# Patient Record
Sex: Female | Born: 1990 | Race: White | Hispanic: No | Marital: Single | Smoking: Never smoker
Health system: Southern US, Community
[De-identification: ages and names within clinical notes are randomized; demographics above are authoritative.]

## PROBLEM LIST (undated history)

## (undated) HISTORY — PX: TENNIS ELBOW RELEASE/NIRSCHEL PROCEDURE: SHX6651

---
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
## 2012-01-06 ENCOUNTER — Ambulatory Visit: Payer: 59 | Attending: Pediatrics | Admitting: Physical Therapy

## 2012-01-06 DIAGNOSIS — M6281 Muscle weakness (generalized): Secondary | ICD-10-CM | POA: Insufficient documentation

## 2012-01-06 DIAGNOSIS — R293 Abnormal posture: Secondary | ICD-10-CM | POA: Insufficient documentation

## 2012-01-06 DIAGNOSIS — IMO0001 Reserved for inherently not codable concepts without codable children: Secondary | ICD-10-CM | POA: Insufficient documentation

## 2012-01-06 DIAGNOSIS — M25676 Stiffness of unspecified foot, not elsewhere classified: Secondary | ICD-10-CM | POA: Insufficient documentation

## 2012-01-06 DIAGNOSIS — R279 Unspecified lack of coordination: Secondary | ICD-10-CM | POA: Insufficient documentation

## 2012-01-06 DIAGNOSIS — M25673 Stiffness of unspecified ankle, not elsewhere classified: Secondary | ICD-10-CM | POA: Insufficient documentation

## 2012-01-06 DIAGNOSIS — R269 Unspecified abnormalities of gait and mobility: Secondary | ICD-10-CM | POA: Insufficient documentation

## 2012-01-13 ENCOUNTER — Ambulatory Visit: Payer: 59 | Admitting: Physical Therapy

## 2012-01-20 ENCOUNTER — Ambulatory Visit: Payer: 59 | Admitting: Physical Therapy

## 2012-01-27 ENCOUNTER — Ambulatory Visit: Payer: 59

## 2012-02-03 ENCOUNTER — Ambulatory Visit: Payer: 59 | Attending: Pediatrics | Admitting: Physical Therapy

## 2012-02-03 DIAGNOSIS — M6281 Muscle weakness (generalized): Secondary | ICD-10-CM | POA: Insufficient documentation

## 2012-02-03 DIAGNOSIS — IMO0001 Reserved for inherently not codable concepts without codable children: Secondary | ICD-10-CM | POA: Insufficient documentation

## 2012-02-03 DIAGNOSIS — R293 Abnormal posture: Secondary | ICD-10-CM | POA: Insufficient documentation

## 2012-02-03 DIAGNOSIS — R279 Unspecified lack of coordination: Secondary | ICD-10-CM | POA: Insufficient documentation

## 2012-02-03 DIAGNOSIS — R269 Unspecified abnormalities of gait and mobility: Secondary | ICD-10-CM | POA: Insufficient documentation

## 2012-02-10 ENCOUNTER — Ambulatory Visit: Payer: 59 | Admitting: Physical Therapy

## 2012-02-17 ENCOUNTER — Ambulatory Visit: Payer: 59 | Admitting: Physical Therapy

## 2012-02-24 ENCOUNTER — Ambulatory Visit: Payer: 59 | Admitting: Physical Therapy

## 2012-03-02 ENCOUNTER — Ambulatory Visit: Payer: 59 | Attending: Pediatrics | Admitting: Physical Therapy

## 2012-03-02 DIAGNOSIS — R269 Unspecified abnormalities of gait and mobility: Secondary | ICD-10-CM | POA: Insufficient documentation

## 2012-03-02 DIAGNOSIS — IMO0001 Reserved for inherently not codable concepts without codable children: Secondary | ICD-10-CM | POA: Insufficient documentation

## 2012-03-02 DIAGNOSIS — M6281 Muscle weakness (generalized): Secondary | ICD-10-CM | POA: Insufficient documentation

## 2012-03-09 ENCOUNTER — Ambulatory Visit: Payer: 59 | Admitting: Physical Therapy

## 2012-03-16 ENCOUNTER — Ambulatory Visit: Payer: 59 | Admitting: Physical Therapy

## 2012-03-23 ENCOUNTER — Ambulatory Visit: Payer: 59 | Admitting: Physical Therapy

## 2012-03-30 ENCOUNTER — Ambulatory Visit: Payer: 59 | Attending: Pediatrics | Admitting: Physical Therapy

## 2012-03-30 DIAGNOSIS — R269 Unspecified abnormalities of gait and mobility: Secondary | ICD-10-CM | POA: Insufficient documentation

## 2012-03-30 DIAGNOSIS — R279 Unspecified lack of coordination: Secondary | ICD-10-CM | POA: Insufficient documentation

## 2012-03-30 DIAGNOSIS — M25669 Stiffness of unspecified knee, not elsewhere classified: Secondary | ICD-10-CM | POA: Insufficient documentation

## 2012-03-30 DIAGNOSIS — M6281 Muscle weakness (generalized): Secondary | ICD-10-CM | POA: Insufficient documentation

## 2012-03-30 DIAGNOSIS — IMO0001 Reserved for inherently not codable concepts without codable children: Secondary | ICD-10-CM | POA: Insufficient documentation

## 2012-03-30 DIAGNOSIS — F82 Specific developmental disorder of motor function: Secondary | ICD-10-CM | POA: Insufficient documentation

## 2012-03-30 DIAGNOSIS — R293 Abnormal posture: Secondary | ICD-10-CM | POA: Insufficient documentation

## 2012-04-06 ENCOUNTER — Ambulatory Visit: Payer: 59 | Admitting: Physical Therapy

## 2012-04-13 ENCOUNTER — Ambulatory Visit: Payer: 59 | Admitting: Physical Therapy

## 2012-04-20 ENCOUNTER — Ambulatory Visit: Payer: 59 | Admitting: Physical Therapy

## 2012-04-27 ENCOUNTER — Ambulatory Visit: Payer: 59 | Admitting: Physical Therapy

## 2012-05-04 ENCOUNTER — Ambulatory Visit: Payer: 59 | Attending: Pediatrics | Admitting: Physical Therapy

## 2012-05-04 DIAGNOSIS — M6281 Muscle weakness (generalized): Secondary | ICD-10-CM | POA: Insufficient documentation

## 2012-05-04 DIAGNOSIS — IMO0001 Reserved for inherently not codable concepts without codable children: Secondary | ICD-10-CM | POA: Insufficient documentation

## 2012-05-04 DIAGNOSIS — R293 Abnormal posture: Secondary | ICD-10-CM | POA: Insufficient documentation

## 2012-05-04 DIAGNOSIS — R269 Unspecified abnormalities of gait and mobility: Secondary | ICD-10-CM | POA: Insufficient documentation

## 2012-05-04 DIAGNOSIS — R279 Unspecified lack of coordination: Secondary | ICD-10-CM | POA: Insufficient documentation

## 2012-05-04 DIAGNOSIS — M2569 Stiffness of other specified joint, not elsewhere classified: Secondary | ICD-10-CM | POA: Insufficient documentation

## 2012-05-11 ENCOUNTER — Ambulatory Visit: Payer: 59 | Admitting: Physical Therapy

## 2012-05-18 ENCOUNTER — Ambulatory Visit: Payer: 59 | Admitting: Physical Therapy

## 2012-05-25 ENCOUNTER — Ambulatory Visit: Payer: 59

## 2012-06-08 ENCOUNTER — Ambulatory Visit: Payer: 59 | Attending: Pediatrics | Admitting: Physical Therapy

## 2012-06-08 DIAGNOSIS — R269 Unspecified abnormalities of gait and mobility: Secondary | ICD-10-CM | POA: Insufficient documentation

## 2012-06-08 DIAGNOSIS — IMO0001 Reserved for inherently not codable concepts without codable children: Secondary | ICD-10-CM | POA: Insufficient documentation

## 2012-06-08 DIAGNOSIS — M6281 Muscle weakness (generalized): Secondary | ICD-10-CM | POA: Insufficient documentation

## 2012-06-15 ENCOUNTER — Ambulatory Visit: Payer: 59 | Admitting: Physical Therapy

## 2012-06-22 ENCOUNTER — Ambulatory Visit: Payer: 59 | Admitting: Physical Therapy

## 2012-06-29 ENCOUNTER — Ambulatory Visit: Payer: 59 | Attending: Pediatrics | Admitting: Physical Therapy

## 2012-06-29 DIAGNOSIS — R269 Unspecified abnormalities of gait and mobility: Secondary | ICD-10-CM | POA: Insufficient documentation

## 2012-06-29 DIAGNOSIS — M6281 Muscle weakness (generalized): Secondary | ICD-10-CM | POA: Insufficient documentation

## 2012-06-29 DIAGNOSIS — IMO0001 Reserved for inherently not codable concepts without codable children: Secondary | ICD-10-CM | POA: Insufficient documentation

## 2012-07-06 ENCOUNTER — Ambulatory Visit: Payer: 59 | Admitting: Physical Therapy

## 2012-07-13 ENCOUNTER — Ambulatory Visit: Payer: 59 | Admitting: Physical Therapy

## 2012-07-20 ENCOUNTER — Ambulatory Visit: Payer: 59 | Admitting: Physical Therapy

## 2012-07-27 ENCOUNTER — Ambulatory Visit: Payer: 59 | Admitting: Physical Therapy

## 2012-08-03 ENCOUNTER — Ambulatory Visit: Payer: 59 | Admitting: Physical Therapy

## 2012-08-10 ENCOUNTER — Ambulatory Visit: Payer: 59 | Admitting: Physical Therapy

## 2012-08-17 ENCOUNTER — Ambulatory Visit: Payer: 59 | Admitting: Physical Therapy

## 2012-08-21 ENCOUNTER — Ambulatory Visit: Payer: 59 | Admitting: Physical Therapy

## 2012-08-24 ENCOUNTER — Ambulatory Visit: Payer: 59 | Admitting: Physical Therapy

## 2012-08-24 ENCOUNTER — Ambulatory Visit: Payer: 59 | Attending: Pediatrics | Admitting: Physical Therapy

## 2012-08-24 DIAGNOSIS — IMO0001 Reserved for inherently not codable concepts without codable children: Secondary | ICD-10-CM | POA: Insufficient documentation

## 2012-08-24 DIAGNOSIS — R269 Unspecified abnormalities of gait and mobility: Secondary | ICD-10-CM | POA: Insufficient documentation

## 2012-08-24 DIAGNOSIS — M6281 Muscle weakness (generalized): Secondary | ICD-10-CM | POA: Insufficient documentation

## 2012-08-31 ENCOUNTER — Ambulatory Visit: Payer: 59 | Admitting: Physical Therapy

## 2012-09-04 ENCOUNTER — Ambulatory Visit: Payer: 59 | Admitting: Physical Therapy

## 2012-09-07 ENCOUNTER — Ambulatory Visit: Payer: 59 | Attending: Pediatrics | Admitting: Physical Therapy

## 2012-09-07 ENCOUNTER — Ambulatory Visit: Payer: 59 | Admitting: Physical Therapy

## 2012-09-07 DIAGNOSIS — IMO0001 Reserved for inherently not codable concepts without codable children: Secondary | ICD-10-CM | POA: Insufficient documentation

## 2012-09-07 DIAGNOSIS — R269 Unspecified abnormalities of gait and mobility: Secondary | ICD-10-CM | POA: Insufficient documentation

## 2012-09-07 DIAGNOSIS — M6281 Muscle weakness (generalized): Secondary | ICD-10-CM | POA: Insufficient documentation

## 2012-09-14 ENCOUNTER — Ambulatory Visit: Payer: 59 | Admitting: Physical Therapy

## 2012-09-18 ENCOUNTER — Ambulatory Visit: Payer: 59 | Admitting: Physical Therapy

## 2012-09-21 ENCOUNTER — Ambulatory Visit: Payer: 59 | Admitting: Physical Therapy

## 2012-10-02 ENCOUNTER — Ambulatory Visit: Payer: 59 | Admitting: Physical Therapy

## 2012-10-09 ENCOUNTER — Encounter (HOSPITAL_BASED_OUTPATIENT_CLINIC_OR_DEPARTMENT_OTHER): Payer: 59 | Attending: Plastic Surgery

## 2012-10-09 DIAGNOSIS — G7109 Other specified muscular dystrophies: Secondary | ICD-10-CM | POA: Insufficient documentation

## 2012-10-09 DIAGNOSIS — L918 Other hypertrophic disorders of the skin: Secondary | ICD-10-CM | POA: Insufficient documentation

## 2012-10-09 DIAGNOSIS — Q059 Spina bifida, unspecified: Secondary | ICD-10-CM | POA: Insufficient documentation

## 2012-10-09 NOTE — Progress Notes (Signed)
[facility]  NAME:  ANAB, VIVAR NO.:  [id number]  MEDICAL RECORD NO.:  [id number]      DATE OF BIRTH:  [birth year]  PHYSICIAN:  Ardath Sax, M.D.           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  She only weighs 50 pounds and she is very frail, lives in a wheelchair.  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 She in 2008 had an operation for scoliosis and they have 2 rods in her back apparently.  Since the surgery she in the middle of this scar, she has about a 4 or 5 cm length, has moist running granulation tissue.  I tried to probe this and I did not notice anything was going down towards the bony processes and the hardware.  I did debride away the hypertrophic granulation tissue and touched it with silver nitrate and we put silver alginate on the wound.  This young girl has temperature 98, a pulse of 100, respirations 18, blood pressure 105/60.  She will be seen back here next week and I will re-evaluate this area.  I would like to put some of the dressings that we have perhaps even Oasis when she comes back.  So I would like to apply for an Oasis or use silver alginate for now and she will return here in a week.  DIAGNOSES: 1. Post repair of scoliosis. 2. Muscular dystrophy.     Ardath Sax, M.D.     PP/MEDQ  D:  10/09/2012  T:  10/09/2012  Job:  [id number]

## 2012-10-16 ENCOUNTER — Ambulatory Visit: Payer: 59 | Admitting: Physical Therapy

## 2012-10-19 ENCOUNTER — Ambulatory Visit: Payer: 59 | Attending: Pediatrics | Admitting: Physical Therapy

## 2012-10-19 DIAGNOSIS — R269 Unspecified abnormalities of gait and mobility: Secondary | ICD-10-CM | POA: Insufficient documentation

## 2012-10-19 DIAGNOSIS — IMO0001 Reserved for inherently not codable concepts without codable children: Secondary | ICD-10-CM | POA: Insufficient documentation

## 2012-10-19 DIAGNOSIS — M6281 Muscle weakness (generalized): Secondary | ICD-10-CM | POA: Insufficient documentation

## 2012-10-30 ENCOUNTER — Encounter (HOSPITAL_BASED_OUTPATIENT_CLINIC_OR_DEPARTMENT_OTHER): Payer: 59 | Attending: Plastic Surgery

## 2012-10-30 ENCOUNTER — Ambulatory Visit: Payer: 59 | Admitting: Physical Therapy

## 2012-10-30 DIAGNOSIS — Y838 Other surgical procedures as the cause of abnormal reaction of the patient, or of later complication, without mention of misadventure at the time of the procedure: Secondary | ICD-10-CM | POA: Insufficient documentation

## 2012-10-30 DIAGNOSIS — T8189XA Other complications of procedures, not elsewhere classified, initial encounter: Secondary | ICD-10-CM | POA: Insufficient documentation

## 2012-11-02 ENCOUNTER — Ambulatory Visit: Payer: 59 | Attending: Pediatrics | Admitting: Physical Therapy

## 2012-11-02 DIAGNOSIS — R269 Unspecified abnormalities of gait and mobility: Secondary | ICD-10-CM | POA: Insufficient documentation

## 2012-11-02 DIAGNOSIS — IMO0001 Reserved for inherently not codable concepts without codable children: Secondary | ICD-10-CM | POA: Insufficient documentation

## 2012-11-02 DIAGNOSIS — M6281 Muscle weakness (generalized): Secondary | ICD-10-CM | POA: Insufficient documentation

## 2012-11-06 ENCOUNTER — Encounter (HOSPITAL_BASED_OUTPATIENT_CLINIC_OR_DEPARTMENT_OTHER): Payer: 59

## 2012-11-13 ENCOUNTER — Ambulatory Visit: Payer: 59 | Admitting: Physical Therapy

## 2012-11-16 ENCOUNTER — Ambulatory Visit: Payer: 59 | Admitting: Physical Therapy

## 2012-11-27 ENCOUNTER — Ambulatory Visit: Payer: 59 | Admitting: Physical Therapy

## 2012-11-30 ENCOUNTER — Ambulatory Visit: Payer: 59 | Admitting: Physical Therapy

## 2012-12-04 ENCOUNTER — Ambulatory Visit: Payer: 59 | Attending: Pediatrics | Admitting: Physical Therapy

## 2012-12-04 DIAGNOSIS — R269 Unspecified abnormalities of gait and mobility: Secondary | ICD-10-CM | POA: Insufficient documentation

## 2012-12-04 DIAGNOSIS — M6281 Muscle weakness (generalized): Secondary | ICD-10-CM | POA: Insufficient documentation

## 2012-12-04 DIAGNOSIS — IMO0001 Reserved for inherently not codable concepts without codable children: Secondary | ICD-10-CM | POA: Insufficient documentation

## 2012-12-14 ENCOUNTER — Ambulatory Visit: Payer: 59 | Admitting: Physical Therapy

## 2012-12-28 ENCOUNTER — Ambulatory Visit: Payer: 59

## 2013-01-08 ENCOUNTER — Ambulatory Visit: Payer: 59 | Attending: Pediatrics | Admitting: Physical Therapy

## 2013-01-08 DIAGNOSIS — R269 Unspecified abnormalities of gait and mobility: Secondary | ICD-10-CM | POA: Insufficient documentation

## 2013-01-08 DIAGNOSIS — M6281 Muscle weakness (generalized): Secondary | ICD-10-CM | POA: Insufficient documentation

## 2013-01-08 DIAGNOSIS — IMO0001 Reserved for inherently not codable concepts without codable children: Secondary | ICD-10-CM | POA: Insufficient documentation

## 2013-01-11 ENCOUNTER — Ambulatory Visit: Payer: 59 | Admitting: Physical Therapy

## 2013-01-22 ENCOUNTER — Ambulatory Visit: Payer: 59 | Admitting: Physical Therapy

## 2013-01-25 ENCOUNTER — Ambulatory Visit: Payer: 59 | Admitting: Physical Therapy

## 2013-02-05 ENCOUNTER — Ambulatory Visit: Payer: 59 | Attending: Pediatrics | Admitting: Physical Therapy

## 2013-02-05 DIAGNOSIS — R269 Unspecified abnormalities of gait and mobility: Secondary | ICD-10-CM | POA: Insufficient documentation

## 2013-02-05 DIAGNOSIS — M6281 Muscle weakness (generalized): Secondary | ICD-10-CM | POA: Insufficient documentation

## 2013-02-05 DIAGNOSIS — IMO0001 Reserved for inherently not codable concepts without codable children: Secondary | ICD-10-CM | POA: Insufficient documentation

## 2013-02-08 ENCOUNTER — Ambulatory Visit: Payer: 59 | Admitting: Physical Therapy

## 2013-02-19 ENCOUNTER — Ambulatory Visit: Payer: 59 | Admitting: Physical Therapy

## 2013-02-22 ENCOUNTER — Ambulatory Visit: Payer: 59 | Admitting: Physical Therapy

## 2013-03-05 ENCOUNTER — Ambulatory Visit: Payer: 59 | Attending: Pediatrics | Admitting: Physical Therapy

## 2013-03-05 DIAGNOSIS — M6281 Muscle weakness (generalized): Secondary | ICD-10-CM | POA: Insufficient documentation

## 2013-03-05 DIAGNOSIS — IMO0001 Reserved for inherently not codable concepts without codable children: Secondary | ICD-10-CM | POA: Insufficient documentation

## 2013-03-05 DIAGNOSIS — R269 Unspecified abnormalities of gait and mobility: Secondary | ICD-10-CM | POA: Insufficient documentation

## 2013-03-08 ENCOUNTER — Ambulatory Visit: Payer: 59 | Admitting: Physical Therapy

## 2013-03-19 ENCOUNTER — Ambulatory Visit: Payer: 59 | Admitting: Physical Therapy

## 2013-03-22 ENCOUNTER — Ambulatory Visit: Payer: 59 | Admitting: Physical Therapy

## 2013-04-02 ENCOUNTER — Ambulatory Visit: Payer: 59 | Attending: Pediatrics | Admitting: Physical Therapy

## 2013-04-02 DIAGNOSIS — IMO0001 Reserved for inherently not codable concepts without codable children: Secondary | ICD-10-CM | POA: Insufficient documentation

## 2013-04-02 DIAGNOSIS — R269 Unspecified abnormalities of gait and mobility: Secondary | ICD-10-CM | POA: Insufficient documentation

## 2013-04-02 DIAGNOSIS — M6281 Muscle weakness (generalized): Secondary | ICD-10-CM | POA: Insufficient documentation

## 2013-04-05 ENCOUNTER — Ambulatory Visit: Payer: 59 | Admitting: Physical Therapy

## 2013-04-16 ENCOUNTER — Ambulatory Visit: Payer: 59 | Admitting: Physical Therapy

## 2013-04-19 ENCOUNTER — Ambulatory Visit: Payer: 59 | Admitting: Physical Therapy

## 2013-04-30 ENCOUNTER — Ambulatory Visit: Payer: 59 | Attending: Pediatrics | Admitting: Physical Therapy

## 2013-04-30 DIAGNOSIS — IMO0001 Reserved for inherently not codable concepts without codable children: Secondary | ICD-10-CM | POA: Insufficient documentation

## 2013-04-30 DIAGNOSIS — M6281 Muscle weakness (generalized): Secondary | ICD-10-CM | POA: Insufficient documentation

## 2013-04-30 DIAGNOSIS — R269 Unspecified abnormalities of gait and mobility: Secondary | ICD-10-CM | POA: Insufficient documentation

## 2013-05-03 ENCOUNTER — Ambulatory Visit: Payer: 59 | Admitting: Physical Therapy

## 2013-05-14 ENCOUNTER — Ambulatory Visit: Payer: 59 | Admitting: Physical Therapy

## 2013-05-17 ENCOUNTER — Ambulatory Visit: Payer: 59 | Admitting: Physical Therapy

## 2013-05-28 ENCOUNTER — Ambulatory Visit: Payer: 59 | Admitting: Physical Therapy

## 2013-05-31 ENCOUNTER — Ambulatory Visit: Payer: 59 | Admitting: Physical Therapy

## 2013-06-11 ENCOUNTER — Ambulatory Visit: Payer: 59 | Admitting: Physical Therapy

## 2013-06-14 ENCOUNTER — Ambulatory Visit: Payer: 59 | Admitting: Physical Therapy

## 2013-06-25 ENCOUNTER — Ambulatory Visit: Payer: 59 | Attending: Pediatrics | Admitting: Physical Therapy

## 2013-06-25 DIAGNOSIS — M6281 Muscle weakness (generalized): Secondary | ICD-10-CM | POA: Insufficient documentation

## 2013-06-25 DIAGNOSIS — R269 Unspecified abnormalities of gait and mobility: Secondary | ICD-10-CM | POA: Insufficient documentation

## 2013-06-25 DIAGNOSIS — IMO0001 Reserved for inherently not codable concepts without codable children: Secondary | ICD-10-CM | POA: Insufficient documentation

## 2013-06-28 ENCOUNTER — Ambulatory Visit: Payer: 59 | Admitting: Physical Therapy

## 2013-07-09 ENCOUNTER — Ambulatory Visit: Payer: 59 | Attending: Pediatrics | Admitting: Physical Therapy

## 2013-07-09 DIAGNOSIS — M6281 Muscle weakness (generalized): Secondary | ICD-10-CM | POA: Insufficient documentation

## 2013-07-09 DIAGNOSIS — R269 Unspecified abnormalities of gait and mobility: Secondary | ICD-10-CM | POA: Insufficient documentation

## 2013-07-09 DIAGNOSIS — IMO0001 Reserved for inherently not codable concepts without codable children: Secondary | ICD-10-CM | POA: Insufficient documentation

## 2013-07-12 ENCOUNTER — Ambulatory Visit: Payer: 59 | Admitting: Physical Therapy

## 2013-07-23 ENCOUNTER — Ambulatory Visit: Payer: 59 | Admitting: Physical Therapy

## 2013-07-26 ENCOUNTER — Ambulatory Visit: Payer: 59 | Admitting: Physical Therapy

## 2013-08-06 ENCOUNTER — Ambulatory Visit: Payer: 59 | Admitting: Physical Therapy

## 2013-08-09 ENCOUNTER — Ambulatory Visit: Payer: 59 | Admitting: Physical Therapy

## 2013-08-09 ENCOUNTER — Ambulatory Visit: Payer: 59 | Attending: Pediatrics | Admitting: Physical Therapy

## 2013-08-09 DIAGNOSIS — M6281 Muscle weakness (generalized): Secondary | ICD-10-CM | POA: Insufficient documentation

## 2013-08-09 DIAGNOSIS — R269 Unspecified abnormalities of gait and mobility: Secondary | ICD-10-CM | POA: Insufficient documentation

## 2013-08-09 DIAGNOSIS — IMO0001 Reserved for inherently not codable concepts without codable children: Secondary | ICD-10-CM | POA: Insufficient documentation

## 2013-08-20 ENCOUNTER — Ambulatory Visit: Payer: 59 | Admitting: Physical Therapy

## 2013-08-23 ENCOUNTER — Ambulatory Visit: Payer: 59 | Admitting: Physical Therapy

## 2013-09-06 ENCOUNTER — Ambulatory Visit: Payer: 59 | Admitting: Physical Therapy

## 2013-09-06 ENCOUNTER — Ambulatory Visit: Payer: 59 | Attending: Pediatrics | Admitting: Physical Therapy

## 2013-09-06 DIAGNOSIS — M6281 Muscle weakness (generalized): Secondary | ICD-10-CM | POA: Insufficient documentation

## 2013-09-06 DIAGNOSIS — R269 Unspecified abnormalities of gait and mobility: Secondary | ICD-10-CM | POA: Insufficient documentation

## 2013-09-06 DIAGNOSIS — IMO0001 Reserved for inherently not codable concepts without codable children: Secondary | ICD-10-CM | POA: Insufficient documentation

## 2013-09-20 ENCOUNTER — Ambulatory Visit: Payer: 59 | Admitting: Physical Therapy

## 2013-10-04 ENCOUNTER — Ambulatory Visit: Payer: 59 | Admitting: Physical Therapy

## 2013-10-04 ENCOUNTER — Ambulatory Visit: Payer: 59 | Attending: Pediatrics | Admitting: Physical Therapy

## 2013-10-04 DIAGNOSIS — IMO0001 Reserved for inherently not codable concepts without codable children: Secondary | ICD-10-CM | POA: Insufficient documentation

## 2013-10-04 DIAGNOSIS — M6281 Muscle weakness (generalized): Secondary | ICD-10-CM | POA: Insufficient documentation

## 2013-10-04 DIAGNOSIS — R269 Unspecified abnormalities of gait and mobility: Secondary | ICD-10-CM | POA: Insufficient documentation

## 2013-10-18 ENCOUNTER — Ambulatory Visit: Payer: 59 | Admitting: Physical Therapy

## 2013-11-01 ENCOUNTER — Ambulatory Visit: Payer: 59 | Attending: Pediatrics | Admitting: Physical Therapy

## 2013-11-01 ENCOUNTER — Ambulatory Visit: Payer: 59 | Admitting: Physical Therapy

## 2013-11-01 DIAGNOSIS — R269 Unspecified abnormalities of gait and mobility: Secondary | ICD-10-CM | POA: Insufficient documentation

## 2013-11-01 DIAGNOSIS — IMO0001 Reserved for inherently not codable concepts without codable children: Secondary | ICD-10-CM | POA: Insufficient documentation

## 2013-11-01 DIAGNOSIS — M6281 Muscle weakness (generalized): Secondary | ICD-10-CM | POA: Insufficient documentation

## 2013-11-15 ENCOUNTER — Ambulatory Visit: Payer: 59 | Admitting: Physical Therapy

## 2013-12-13 ENCOUNTER — Ambulatory Visit: Payer: 59 | Admitting: Physical Therapy

## 2013-12-13 ENCOUNTER — Ambulatory Visit: Payer: 59 | Attending: Pediatrics | Admitting: Physical Therapy

## 2013-12-13 DIAGNOSIS — IMO0001 Reserved for inherently not codable concepts without codable children: Secondary | ICD-10-CM | POA: Insufficient documentation

## 2013-12-13 DIAGNOSIS — M6281 Muscle weakness (generalized): Secondary | ICD-10-CM | POA: Insufficient documentation

## 2013-12-13 DIAGNOSIS — R269 Unspecified abnormalities of gait and mobility: Secondary | ICD-10-CM | POA: Insufficient documentation

## 2013-12-27 ENCOUNTER — Ambulatory Visit: Payer: 59 | Admitting: Physical Therapy

## 2013-12-27 ENCOUNTER — Encounter (INDEPENDENT_AMBULATORY_CARE_PROVIDER_SITE_OTHER): Payer: Self-pay

## 2014-01-09 ENCOUNTER — Encounter (HOSPITAL_BASED_OUTPATIENT_CLINIC_OR_DEPARTMENT_OTHER): Payer: 59 | Attending: General Surgery

## 2014-01-09 DIAGNOSIS — K219 Gastro-esophageal reflux disease without esophagitis: Secondary | ICD-10-CM | POA: Insufficient documentation

## 2014-01-09 DIAGNOSIS — G7109 Other specified muscular dystrophies: Secondary | ICD-10-CM | POA: Insufficient documentation

## 2014-01-09 DIAGNOSIS — Z79899 Other long term (current) drug therapy: Secondary | ICD-10-CM | POA: Insufficient documentation

## 2014-01-09 DIAGNOSIS — M21619 Bunion of unspecified foot: Secondary | ICD-10-CM | POA: Insufficient documentation

## 2014-01-09 DIAGNOSIS — Y838 Other surgical procedures as the cause of abnormal reaction of the patient, or of later complication, without mention of misadventure at the time of the procedure: Secondary | ICD-10-CM | POA: Insufficient documentation

## 2014-01-09 DIAGNOSIS — G4730 Sleep apnea, unspecified: Secondary | ICD-10-CM | POA: Insufficient documentation

## 2014-01-09 DIAGNOSIS — T8189XA Other complications of procedures, not elsewhere classified, initial encounter: Secondary | ICD-10-CM | POA: Insufficient documentation

## 2014-01-10 ENCOUNTER — Ambulatory Visit: Payer: 59 | Attending: Pediatrics | Admitting: Physical Therapy

## 2014-01-10 ENCOUNTER — Ambulatory Visit: Payer: 59 | Admitting: Physical Therapy

## 2014-01-10 DIAGNOSIS — IMO0001 Reserved for inherently not codable concepts without codable children: Secondary | ICD-10-CM | POA: Insufficient documentation

## 2014-01-10 DIAGNOSIS — R269 Unspecified abnormalities of gait and mobility: Secondary | ICD-10-CM | POA: Insufficient documentation

## 2014-01-10 DIAGNOSIS — M6281 Muscle weakness (generalized): Secondary | ICD-10-CM | POA: Insufficient documentation

## 2014-01-23 NOTE — H&P (Signed)
NAME:  Yvonne Salazar, Yvonne Salazar               ACCOUNT NO.:  MEDICAL RECORD NO.:  [id number]  LOCATION:                                 FACILITY:  PHYSICIAN:  Joanne Gaveloy Fredda Clarida, M.D.        DATE OF BIRTH:  1991/11/23  DATE OF ADMISSION: DATE OF DISCHARGE:                             HISTORY & PHYSICAL   CHIEF COMPLAINT:  Wound VAC.  HISTORY OF PRESENT ILLNESS:  This is a 23 year old with muscular dystrophy who has had several back operations for the same midline incision.  She now has a sizable wound of a bunion granulation tissue, which she thinks has been present on and off for 5+ years.  PAST MEDICAL HISTORY:  Significant for pneumonia, sleep apnea, scoliosis, muscular dystrophy, and GERD.  PAST SURGICAL HISTORY:  Syrinx surgery in 2008, scoliosis repair in 2008, and Achilles tendon release in 2014.  MEDICATIONS:  Nexium and Advil.  ALLERGIES:  None.  CIGARETTES:  None.  ALCOHOL:  None.  REVIEW OF SYSTEMS:  As above.  PHYSICAL EXAMINATION:  VITAL SIGNS:  Temperature 98.6, pulse 86, respirations 19, blood pressure 98/67. GENERAL APPEARANCE:  A very slender female in no distress. CHEST:  Clear. HEART:  Regular rhythm. BACK:  Examination of the back reveals in the midst of a midline incision which is healed, a 6.8 x 1.1 area of very abundant granulation tissue.  IMPRESSION:  Unhealed surgical wound of the back.  PLAN OF TREATMENT:  We started with silver nitrate.  We will dress with silver alginate, and we will see her in 7 days probably for another silver nitrate cautery.     Joanne Gaveloy Sehaj Kolden, M.D.     RA/MEDQ  D:  01/22/2014  T:  01/22/2014  Job:  [id number]

## 2014-01-24 ENCOUNTER — Ambulatory Visit: Payer: 59

## 2014-01-24 ENCOUNTER — Ambulatory Visit: Payer: 59 | Admitting: Physical Therapy

## 2014-01-29 ENCOUNTER — Encounter (HOSPITAL_BASED_OUTPATIENT_CLINIC_OR_DEPARTMENT_OTHER): Payer: 59 | Attending: General Surgery

## 2014-01-29 DIAGNOSIS — T8189XA Other complications of procedures, not elsewhere classified, initial encounter: Secondary | ICD-10-CM | POA: Insufficient documentation

## 2014-01-29 DIAGNOSIS — Y849 Medical procedure, unspecified as the cause of abnormal reaction of the patient, or of later complication, without mention of misadventure at the time of the procedure: Secondary | ICD-10-CM | POA: Insufficient documentation

## 2014-02-07 ENCOUNTER — Ambulatory Visit: Payer: 59 | Attending: Pediatrics | Admitting: Physical Therapy

## 2014-02-07 ENCOUNTER — Ambulatory Visit: Payer: 59 | Admitting: Physical Therapy

## 2014-02-07 DIAGNOSIS — M6281 Muscle weakness (generalized): Secondary | ICD-10-CM | POA: Insufficient documentation

## 2014-02-07 DIAGNOSIS — IMO0001 Reserved for inherently not codable concepts without codable children: Secondary | ICD-10-CM | POA: Insufficient documentation

## 2014-02-07 DIAGNOSIS — R269 Unspecified abnormalities of gait and mobility: Secondary | ICD-10-CM | POA: Insufficient documentation

## 2014-02-21 ENCOUNTER — Ambulatory Visit: Payer: 59 | Admitting: Physical Therapy

## 2014-03-05 ENCOUNTER — Encounter (HOSPITAL_BASED_OUTPATIENT_CLINIC_OR_DEPARTMENT_OTHER): Payer: 59 | Attending: General Surgery

## 2014-03-05 DIAGNOSIS — T8189XA Other complications of procedures, not elsewhere classified, initial encounter: Secondary | ICD-10-CM | POA: Insufficient documentation

## 2014-03-05 DIAGNOSIS — Y849 Medical procedure, unspecified as the cause of abnormal reaction of the patient, or of later complication, without mention of misadventure at the time of the procedure: Secondary | ICD-10-CM | POA: Insufficient documentation

## 2014-03-07 ENCOUNTER — Ambulatory Visit: Payer: 59 | Attending: Pediatrics | Admitting: Physical Therapy

## 2014-03-07 ENCOUNTER — Ambulatory Visit: Payer: 59 | Admitting: Physical Therapy

## 2014-03-07 DIAGNOSIS — R29898 Other symptoms and signs involving the musculoskeletal system: Secondary | ICD-10-CM | POA: Insufficient documentation

## 2014-03-07 DIAGNOSIS — IMO0001 Reserved for inherently not codable concepts without codable children: Secondary | ICD-10-CM | POA: Insufficient documentation

## 2014-03-21 ENCOUNTER — Ambulatory Visit: Payer: 59 | Admitting: Physical Therapy

## 2014-03-21 ENCOUNTER — Ambulatory Visit: Payer: 59 | Attending: Pediatrics | Admitting: Physical Therapy

## 2014-03-21 DIAGNOSIS — IMO0001 Reserved for inherently not codable concepts without codable children: Secondary | ICD-10-CM | POA: Insufficient documentation

## 2014-03-21 DIAGNOSIS — R269 Unspecified abnormalities of gait and mobility: Secondary | ICD-10-CM | POA: Insufficient documentation

## 2014-03-21 DIAGNOSIS — M6281 Muscle weakness (generalized): Secondary | ICD-10-CM | POA: Insufficient documentation

## 2014-04-02 ENCOUNTER — Encounter (HOSPITAL_BASED_OUTPATIENT_CLINIC_OR_DEPARTMENT_OTHER): Payer: 59 | Attending: General Surgery

## 2014-04-02 DIAGNOSIS — T8189XA Other complications of procedures, not elsewhere classified, initial encounter: Secondary | ICD-10-CM | POA: Insufficient documentation

## 2014-04-02 DIAGNOSIS — G7109 Other specified muscular dystrophies: Secondary | ICD-10-CM | POA: Insufficient documentation

## 2014-04-02 DIAGNOSIS — Y831 Surgical operation with implant of artificial internal device as the cause of abnormal reaction of the patient, or of later complication, without mention of misadventure at the time of the procedure: Secondary | ICD-10-CM | POA: Insufficient documentation

## 2014-04-02 DIAGNOSIS — M4120 Other idiopathic scoliosis, site unspecified: Secondary | ICD-10-CM | POA: Insufficient documentation

## 2014-04-04 ENCOUNTER — Ambulatory Visit: Payer: 59 | Attending: Pediatrics | Admitting: Physical Therapy

## 2014-04-04 ENCOUNTER — Ambulatory Visit: Payer: 59 | Admitting: Physical Therapy

## 2014-04-04 DIAGNOSIS — R269 Unspecified abnormalities of gait and mobility: Secondary | ICD-10-CM | POA: Insufficient documentation

## 2014-04-04 DIAGNOSIS — IMO0001 Reserved for inherently not codable concepts without codable children: Secondary | ICD-10-CM | POA: Insufficient documentation

## 2014-04-04 DIAGNOSIS — M6281 Muscle weakness (generalized): Secondary | ICD-10-CM | POA: Insufficient documentation

## 2014-04-17 NOTE — Progress Notes (Signed)
[facility]  NAME:  Yvonne Salazar, Yvonne Salazar               ACCOUNT NO.:  MEDICAL RECORD NO.:  [id number]      DATE OF BIRTH:  [birth year]  PHYSICIAN:  Glenna FellowsBrinda Rocio Roam, MD    VISIT DATE:  04/15/2014                                  OFFICE VISIT   CHIEF COMPLAINT:  Back ulceration.  HISTORY OF PRESENT ILLNESS:  The patient is a 23 year old female with history of severe scoliosis and muscular dystrophy that presents with a chronic back wound.  The patient has been under the care of Dr. Wiliam KeArkin and is referred to this clinic for evaluation.  Her current wound care has been silver alginate.  She has previously had Oasis application as well as collagen treatment.  The patient initially presented with a wound that was measured 6.8 x 1.1 cm.  Her current wound is significantly contracted, and she also notes significant contraction since her last visit here.  Her wound care is completed by her mother.  She has had no laboratories in the last 6 months' time at least.  She is not taking any protein supplementation.  The mother reports that the wound that occurred in her previous scar line during the woodworking classes she feels she may have been too active for a fresh scar.  She has never had any exposure for hardware with this wound.  She has never required any previous flap surgery or grafting.  She does have hardware in her spine from her previous back surgeries.  EXAMINATION:  Blood pressure is 106/69, pulse is 96, temperature is 98.3.  The back wound is measured as 1.3 x 0.3 x 0.1 cm.  The wound is completely granulated.  There is no slough present.  This is over 50% contraction since her last visit here.  ASSESSMENT AND PLAN:  Contracting back wound.  Given the current size and the lack of drainage, I counseled the family to return to collagen and change 2-3 times weekly.  She has demonstrated significant change over the last 2 weeks despite the chronic nature of her  wounds.  We will plan to reassess in 3 weeks time.  We will order laboratory including prealbumin, and we instructed on increasing her protein intake, including Carnation Instant breakfast or a whey protein powder for supplementation.          ______________________________ Glenna FellowsBrinda Eissa Buchberger, MD     BT/MEDQ  D:  04/15/2014  T:  04/16/2014  Job:  [id number]

## 2014-04-18 ENCOUNTER — Ambulatory Visit: Payer: 59 | Admitting: Physical Therapy

## 2014-05-02 ENCOUNTER — Ambulatory Visit: Payer: 59 | Attending: Pediatrics | Admitting: Physical Therapy

## 2014-05-02 ENCOUNTER — Ambulatory Visit: Payer: 59 | Admitting: Physical Therapy

## 2014-05-02 DIAGNOSIS — R269 Unspecified abnormalities of gait and mobility: Secondary | ICD-10-CM | POA: Insufficient documentation

## 2014-05-02 DIAGNOSIS — IMO0001 Reserved for inherently not codable concepts without codable children: Secondary | ICD-10-CM | POA: Insufficient documentation

## 2014-05-02 DIAGNOSIS — M6281 Muscle weakness (generalized): Secondary | ICD-10-CM | POA: Insufficient documentation

## 2014-05-06 ENCOUNTER — Encounter (HOSPITAL_BASED_OUTPATIENT_CLINIC_OR_DEPARTMENT_OTHER): Payer: 59 | Attending: General Surgery

## 2014-05-06 DIAGNOSIS — T8189XA Other complications of procedures, not elsewhere classified, initial encounter: Secondary | ICD-10-CM | POA: Insufficient documentation

## 2014-05-06 DIAGNOSIS — M4120 Other idiopathic scoliosis, site unspecified: Secondary | ICD-10-CM | POA: Insufficient documentation

## 2014-05-06 DIAGNOSIS — G7109 Other specified muscular dystrophies: Secondary | ICD-10-CM | POA: Insufficient documentation

## 2014-05-06 DIAGNOSIS — Y839 Surgical procedure, unspecified as the cause of abnormal reaction of the patient, or of later complication, without mention of misadventure at the time of the procedure: Secondary | ICD-10-CM | POA: Insufficient documentation

## 2014-05-07 NOTE — Progress Notes (Signed)
[facility]  NAME:  Yvonne Salazar, Yvonne Salazar NO.:  MEDICAL RECORD NO.:  [id number]      DATE OF BIRTH:  07-12-1991  PHYSICIAN:  Glenna Fellows, MD    VISIT DATE:  05/06/2014                                  OFFICE VISIT   [name] is a 23 year old female with history of severe scoliosis and muscular dystrophy that presents for followup of a chronic back wound. Over her last visit, she has been doing collagen and alternating with alginate that completed by her mother.  She has had previous Oasis application, last was at least 2 months ago. They note scant drainage from the wound.  PHYSICAL EXAMINATION:  VITAL SIGNS:  Blood pressure is 95/66, pulse is 102, temperature is 98.5.  Back wound is measured 0.7 x 0.3 x 0.1 cm.  No debridement was performed.  The wound is completely granulated.  ASSESSMENT:  Although this does represent a contraction of her wound, it is very slow in nature and scar and that new epithelialization does not appear to be stable.  We will plan for application of Oasis today. Following 1 week, mother will resume collagen treatment.  I have counseled against the use of alginate as I feel the wound is quite dry to begin with and I am concerned about removing any of the new skin with the dried alginate.  After irrigation of the wound, 10.5 cm2 of Oasis substrate was then applied to the wound.  It was moistened with normal saline and covered with gauze dressing.  We will plan for followup in 3 week's time.          ______________________________ Glenna Fellows, MD     BT/MEDQ  D:  05/06/2014  T:  05/07/2014  Job:  [id number]

## 2014-05-16 ENCOUNTER — Ambulatory Visit: Payer: 59 | Admitting: Physical Therapy

## 2014-05-30 ENCOUNTER — Ambulatory Visit: Payer: 59 | Admitting: Physical Therapy

## 2014-05-30 ENCOUNTER — Ambulatory Visit: Payer: 59 | Attending: Pediatrics

## 2014-05-30 DIAGNOSIS — M6281 Muscle weakness (generalized): Secondary | ICD-10-CM | POA: Insufficient documentation

## 2014-05-30 DIAGNOSIS — IMO0001 Reserved for inherently not codable concepts without codable children: Secondary | ICD-10-CM | POA: Diagnosis not present

## 2014-05-30 DIAGNOSIS — R269 Unspecified abnormalities of gait and mobility: Secondary | ICD-10-CM | POA: Insufficient documentation

## 2014-06-13 ENCOUNTER — Ambulatory Visit: Payer: 59 | Admitting: Physical Therapy

## 2014-06-13 ENCOUNTER — Ambulatory Visit: Payer: 59

## 2014-06-13 DIAGNOSIS — IMO0001 Reserved for inherently not codable concepts without codable children: Secondary | ICD-10-CM | POA: Diagnosis not present

## 2014-06-27 ENCOUNTER — Ambulatory Visit: Payer: 59 | Admitting: Physical Therapy

## 2014-06-27 DIAGNOSIS — IMO0001 Reserved for inherently not codable concepts without codable children: Secondary | ICD-10-CM | POA: Diagnosis not present

## 2014-07-11 ENCOUNTER — Ambulatory Visit: Payer: 59 | Attending: Pediatrics | Admitting: Physical Therapy

## 2014-07-11 ENCOUNTER — Ambulatory Visit: Payer: 59 | Admitting: Physical Therapy

## 2014-07-11 DIAGNOSIS — IMO0001 Reserved for inherently not codable concepts without codable children: Secondary | ICD-10-CM | POA: Diagnosis not present

## 2014-07-11 DIAGNOSIS — R269 Unspecified abnormalities of gait and mobility: Secondary | ICD-10-CM | POA: Diagnosis not present

## 2014-07-11 DIAGNOSIS — M6281 Muscle weakness (generalized): Secondary | ICD-10-CM | POA: Diagnosis not present

## 2014-07-25 ENCOUNTER — Ambulatory Visit: Payer: 59 | Admitting: Physical Therapy

## 2014-07-25 DIAGNOSIS — IMO0001 Reserved for inherently not codable concepts without codable children: Secondary | ICD-10-CM | POA: Diagnosis not present

## 2014-08-08 ENCOUNTER — Ambulatory Visit: Payer: 59 | Attending: Pediatrics | Admitting: Physical Therapy

## 2014-08-08 ENCOUNTER — Ambulatory Visit: Payer: 59 | Admitting: Physical Therapy

## 2014-08-08 DIAGNOSIS — M6281 Muscle weakness (generalized): Secondary | ICD-10-CM | POA: Insufficient documentation

## 2014-08-08 DIAGNOSIS — IMO0001 Reserved for inherently not codable concepts without codable children: Secondary | ICD-10-CM | POA: Insufficient documentation

## 2014-08-08 DIAGNOSIS — R269 Unspecified abnormalities of gait and mobility: Secondary | ICD-10-CM | POA: Diagnosis not present

## 2014-08-22 ENCOUNTER — Ambulatory Visit: Payer: 59 | Admitting: Physical Therapy

## 2014-08-22 DIAGNOSIS — IMO0001 Reserved for inherently not codable concepts without codable children: Secondary | ICD-10-CM | POA: Diagnosis not present

## 2014-09-05 ENCOUNTER — Ambulatory Visit: Payer: 59 | Attending: Pediatrics | Admitting: Physical Therapy

## 2014-09-05 ENCOUNTER — Ambulatory Visit: Payer: 59 | Admitting: Physical Therapy

## 2014-09-05 DIAGNOSIS — M6281 Muscle weakness (generalized): Secondary | ICD-10-CM | POA: Insufficient documentation

## 2014-09-05 DIAGNOSIS — R293 Abnormal posture: Secondary | ICD-10-CM | POA: Insufficient documentation

## 2014-09-05 DIAGNOSIS — R269 Unspecified abnormalities of gait and mobility: Secondary | ICD-10-CM | POA: Diagnosis not present

## 2014-09-19 ENCOUNTER — Ambulatory Visit: Payer: 59 | Admitting: Physical Therapy

## 2014-09-19 DIAGNOSIS — M6281 Muscle weakness (generalized): Secondary | ICD-10-CM | POA: Diagnosis not present

## 2014-10-03 ENCOUNTER — Encounter: Payer: Self-pay | Admitting: Physical Therapy

## 2014-10-03 ENCOUNTER — Ambulatory Visit: Payer: 59 | Admitting: Physical Therapy

## 2014-10-03 ENCOUNTER — Ambulatory Visit: Payer: 59 | Attending: Family Medicine | Admitting: Physical Therapy

## 2014-10-03 DIAGNOSIS — Z7409 Other reduced mobility: Secondary | ICD-10-CM | POA: Diagnosis not present

## 2014-10-03 DIAGNOSIS — M2450 Contracture, unspecified joint: Secondary | ICD-10-CM

## 2014-10-03 DIAGNOSIS — R293 Abnormal posture: Secondary | ICD-10-CM | POA: Diagnosis not present

## 2014-10-03 DIAGNOSIS — R531 Weakness: Secondary | ICD-10-CM

## 2014-10-03 DIAGNOSIS — R52 Pain, unspecified: Secondary | ICD-10-CM | POA: Insufficient documentation

## 2014-10-03 NOTE — Patient Instructions (Signed)
Yvonne Salazar was encouraged to move out of resting posture as much as possible.

## 2014-10-03 NOTE — Therapy (Signed)
Pediatric Physical Therapy Treatment  Patient Details  Name: Yvonne Salazar MRN: [id number] Date of Birth: 1991-09-19  Encounter date: 10/03/2014      End of Session - 10/03/14 1234    Visit Number [id number]   Date for PT Re-Evaluation 02/20/15   Authorization Type [employer]   Authorization Time Period next recertification is due on 02/20/15   Authorization - Visit Number 10   Authorization - Number of Visits 60   PT Start Time 0820   PT Stop Time 0900   PT Time Calculation (min) 40 min   Activity Tolerance Patient tolerated treatment well      History reviewed. No pertinent past medical history.  History reviewed. No pertinent past surgical history.  There were no vitals taken for this visit.  Visit Diagnosis:Flexion contractures  Decreased strength, endurance, and mobility  Pain  Posture abnormality  Limited mobility           Pediatric PT Treatment - 10/03/14 1229    Subjective Information   Patient Comments Yvonne Salazar feels a little stiff today; has been very busy with college classes.   PT Pediatric Exercise/Activities   Exercise/Activities Strengthening Activities;Core Stability Activities;ROM   Strengthening Activities --  active lateral flexion to the left in w/c and EOM   Activities Performed   Comment Yvonne Salazar worked [employer] with feet dangling   Core Stability Details Yvonne Salazar leaneg against PT's hand support.   ROM   Comment Yvonne Salazar's neck was stretched out of right lateral flexion and upper trapezius muscles were massaged.  Yvonne Salazar was also passively stretched out of elbow flexion, knee flexion and hip adduction and ankle plantarfleixon.           Patient Education - 10/03/14 1234    Education Provided Yes   Education Description Discussed Yvonne Salazar's resting posture and need to move out of it.   Person(s) Educated Patient;Mother   Method Education Verbal explanation;Demonstration;Observed session   Comprehension Returned demonstration          Peds PT Short Term  Goals - 10/03/14 1239    PEDS PT  SHORT TERM GOAL #1   Title Yvonne Salazar will be able to hold head in less than 30 degree of right lateral flexion after neck stretches for at least 10 seconds.   Baseline Yvonne Salazar holds head in 45 degrees of right lateral flexion.   Time 6   Period Months   Status On-going   PEDS PT  SHORT TERM GOAL #2   Title Yvonne Salazar will have knees extended to flexion point of 45 degrees to decrease knee contractions.   Baseline Yvonne Salazar holds knees in 90 degrees, but they can be extended to 60 degrees of flexion.   Time 6   Period Months   Status On-going   PEDS PT  SHORT TERM GOAL #3   Title Yvonne Salazar will sit at the edge of mat for five minutes with no support (no learning on own extremities).   Baseline Yvonne Salazar leans after about 30 seconds.   Time 6   Period Months   Status On-going   PEDS PT  SHORT TERM GOAL #4   Title Yvonne Salazar will perform forced exhalation work (staccato breaths) X 10 consecutive exercises before taking a break to improve respiratory control.    Baseline Yvonne Salazar can do 5 forced consecutive exhalations.   Time 6   Period Months   Status On-going            Plan - 10/03/14 1236    Clinical Impression Statement  Patient fatigues without support of wheelchair/without back or extremity support.  Her right SCM is very tight and full body asymmetry is increasing.     Patient will benefit from treatment of the following deficits: Decreased ability to maintain good postural alignment;Decreased ability to participate in recreational activities;Decreased function at home and in the community   Rehab Potential Excellent   Clinical impairments affecting rehab potential N/A   PT Frequency 1X/week   PT Duration 6 months   PT Treatment/Intervention Therapeutic activities;Therapeutic exercises;Patient/family education;Neuromuscular reeducation;Manual techniques;Instruction proper posture/body mechanics;Self-care and home management;Other (comment)  massage   PT plan Patient will be  seen weekly to improve postural alignment and comfort.       Problem List There are no active problems to display for this patient.                   Lathen Seal 10/03/2014, 12:48 PM

## 2014-10-17 ENCOUNTER — Ambulatory Visit: Payer: 59 | Admitting: Physical Therapy

## 2014-10-17 ENCOUNTER — Encounter: Payer: Self-pay | Admitting: Physical Therapy

## 2014-10-17 DIAGNOSIS — Z7409 Other reduced mobility: Secondary | ICD-10-CM

## 2014-10-17 DIAGNOSIS — R52 Pain, unspecified: Secondary | ICD-10-CM

## 2014-10-17 DIAGNOSIS — M2450 Contracture, unspecified joint: Secondary | ICD-10-CM

## 2014-10-17 DIAGNOSIS — R293 Abnormal posture: Secondary | ICD-10-CM

## 2014-10-17 DIAGNOSIS — R531 Weakness: Secondary | ICD-10-CM

## 2014-10-17 DIAGNOSIS — R6889 Other general symptoms and signs: Secondary | ICD-10-CM

## 2014-10-17 NOTE — Therapy (Signed)
Pediatric Physical Therapy Treatment  Patient Details  Name: Yvonne Salazar MRN: [id number] Date of Birth: [birth year]  Encounter date: 10/17/2014      End of Session - 10/17/14 1142    Visit Number 329   Authorization Type [employer]   Authorization Time Period next recertification is due on 02/20/15   Authorization - Visit Number 11   Authorization - Number of Visits 60   PT Start Time 0830   PT Stop Time 0905   PT Time Calculation (min) 35 min   Activity Tolerance Patient tolerated treatment well   Behavior During Therapy Willing to participate   Activity Tolerance Patient tolerated treatment well      History reviewed. No pertinent past medical history.  History reviewed. No pertinent past surgical history.  There were no vitals taken for this visit.  Visit Diagnosis:Flexion contractures  Decreased strength, endurance, and mobility  Pain  Posture abnormality  Limited mobility           Pediatric PT Treatment - 10/17/14 0913    Subjective Information   Patient Comments Yvonne Salazar reports having a rough night, with shooting pain into her left foot (on the dorsum of her foot).     PT Pediatric Exercise/Activities   Strengthening Activities Yvonne Salazar sat in her wheelchair at start and end of session, and her knees and ankles and elbows were moved to end-ranges.    Activities Performed   Core Stability Details Yvonne Salazar moved to edge of mat with foot support.  Yvonne Salazar sat in this position for about 15 minutes.  She was transferred two times on the mat via a lift transfer/total assist, and Yvonne Salazar was able to provide feedback regarding positioning during transfer.  Yvonne Salazar moved out of her base of support with bilateral hand support, leaning forward and back and laterally for about 3 minutes.     ROM   Ankle DF Yvonne Salazar did perform ankle pumps, about 5 reps, 2 trials, on both sides when knees were extended out of 90 degree resting posture.     Comment Yvonne Salazar was instructed to keep both feet in  her foot rests because wind sweeping toward the right causes her to hold her left foot right of midline.              Patient Education - 10/17/14 1139    Education Provided Yes   Education Description Reiterated to Yvonne Salazar that PT will recommend more stabile and restrictive foot plates if she is unable to keep her feet in a more symmetric resting posture.   Person(s) Educated Mother;Patient   Method Education Verbal explanation   Comprehension Verbalized understanding              Plan - 10/17/14 1144    Clinical Impression Statement Yvonne Salazar does prop her neck with her right arm when sitting without support.  Yvonne Salazar reports this is secondary to neck muscular fatigue; however, when her right arm is unsupported, she has increased strain on her neck posture.  Yvonne Salazar is complaining of increased parasthesia intermittently effecting her sleep pattern with radiating pain to her left foot (dorsum).   [name] posture is increasingly asymmetric in her whleeelchair at her lower extremities secondary to hip tightness bilaterally.     Patient will benefit from treatment of the following deficits: Decreased ability to maintain good postural alignment;Decreased ability to participate in recreational activities;Decreased function at home and in the community   Rehab Potential Excellent   Clinical impairments affecting rehab potential N/A   PT  Frequency 1X/week   PT Duration 6 months   PT Treatment/Intervention Therapeutic activities;Therapeutic exercises;Neuromuscular reeducation;Patient/family education;Wheelchair management;Manual techniques;Instruction proper posture/body mechanics;Self-care and home management   PT plan Continue weekly PT to promote increased ROM and strengthening of core and respiratory muscles.       Problem List There are no active problems to display for this patient.                   Shaela Boer PT 10/17/2014, 11:49 AM

## 2014-10-31 ENCOUNTER — Ambulatory Visit: Payer: 59 | Attending: Pediatrics | Admitting: Physical Therapy

## 2014-10-31 ENCOUNTER — Encounter: Payer: Self-pay | Admitting: Physical Therapy

## 2014-10-31 ENCOUNTER — Ambulatory Visit: Payer: 59 | Admitting: Physical Therapy

## 2014-10-31 DIAGNOSIS — Z7409 Other reduced mobility: Secondary | ICD-10-CM

## 2014-10-31 DIAGNOSIS — R6889 Other general symptoms and signs: Secondary | ICD-10-CM

## 2014-10-31 DIAGNOSIS — R293 Abnormal posture: Secondary | ICD-10-CM | POA: Insufficient documentation

## 2014-10-31 DIAGNOSIS — R531 Weakness: Secondary | ICD-10-CM

## 2014-10-31 DIAGNOSIS — R52 Pain, unspecified: Secondary | ICD-10-CM | POA: Insufficient documentation

## 2014-10-31 DIAGNOSIS — M2450 Contracture, unspecified joint: Secondary | ICD-10-CM | POA: Diagnosis not present

## 2014-10-31 NOTE — Therapy (Signed)
[facility] [address] [place], [place], [address] Phone: [phone]   Fax:  [phone]  Pediatric Physical Therapy Treatment  Patient Details  Name: Yvonne Salazar MRN: [id number] Date of Birth: [birth year]  Encounter date: 10/31/2014      End of Session - 10/31/14 1231    Visit Number 330   Authorization Type UHC   Authorization Time Period next recertification is due on 02/20/15   Authorization - Visit Number 12   Authorization - Number of Visits 60   PT Start Time 0820   PT Stop Time 0900   PT Time Calculation (min) 40 min   Activity Tolerance Patient tolerated treatment well   Behavior During Therapy Willing to participate   Activity Tolerance Patient tolerated treatment well      History reviewed. No pertinent past medical history.  History reviewed. No pertinent past surgical history.  There were no vitals taken for this visit.  Visit Diagnosis:Flexion contractures  Decreased strength, endurance, and mobility  Pain  Limited mobility  Posture abnormality           Pediatric PT Treatment - 10/31/14 0947    Subjective Information   Patient Comments Yvonne Salazar reports not having the shooting pains in her leg right now.  She is looking forward to a visit from her sister this coming weekend.     Gross Motor Activities   Comment Yvonne Salazar transferred from w/c to mat with PT, and trasnferred back to w/c with SPT.  Yvonne Salazar was able to provide feedback to SPT to tell her why she felt unstable (wants legs/thighs mores supported).  Yvonne Salazar sat edge of mat for five minutes with feet on low bench and swaying initially imposed by PT, but then self-initiated.  She did not need to lean her head during this time.  She did sit with posterior support, including for neck, another five minutes.     Therapeutic Activities   Therapeutic Activity Details After Yvonne Salazar moved back to wheelchair, Yvonne Salazar held hands at midline and rotated from  side to side for 2 minutes without putting arms on armrests.  Also, Yvonne Salazar assisted UE's from lap to shoulder height X 10.     ROM   Knee Extension(hamstrings) Passively stretched knees into extension bilateraly while sitting in chair (each LE about one minute).             Patient Education - 10/31/14 1230    Education Provided Yes   Education Description Asked Yvonne Salazar to work on cardio/UE movement every day.  Mom plans to work on Yvonne representativetossing beach ball again.   Person(s) Educated Mother;Patient   Method Education Verbal explanation;Observed session   Comprehension Returned demonstration              Plan - 10/31/14 1231    Clinical Impression Statement Yvonne Salazar is able to work on cardio vascular work in her wheelchair by utilizing UE movement (active assisted) for timed trials.  Today, she showed more endurance for sitting without support (five minutes) and sustained UE cardio in wheelchair (2 minutes) without rest than she has demonstrated during therapy in several months.   Patient will benefit from treatment of the following deficits: Decreased ability to maintain good postural alignment;Decreased ability to participate in recreational activities;Decreased function at home and in the community   PT plan Continue PT every other week to increase Yvonne Salazar's endurance, strength and postural control.  Problem List There are no active problems to display for this patient.   SAWULSKI,CARRIE 10/31/2014, 12:44 PM  Everardo Bealsarrie Sawulski, PT 10/31/2014 12:44 PM Phone: [phone] Fax: [phone]

## 2014-11-14 ENCOUNTER — Ambulatory Visit: Payer: 59 | Admitting: Physical Therapy

## 2014-11-14 ENCOUNTER — Encounter: Payer: Self-pay | Admitting: Physical Therapy

## 2014-11-14 DIAGNOSIS — M2450 Contracture, unspecified joint: Secondary | ICD-10-CM | POA: Diagnosis not present

## 2014-11-14 DIAGNOSIS — R293 Abnormal posture: Secondary | ICD-10-CM

## 2014-11-14 DIAGNOSIS — Z7409 Other reduced mobility: Secondary | ICD-10-CM

## 2014-11-14 DIAGNOSIS — R6889 Other general symptoms and signs: Secondary | ICD-10-CM

## 2014-11-14 DIAGNOSIS — R531 Weakness: Secondary | ICD-10-CM

## 2014-11-14 DIAGNOSIS — R52 Pain, unspecified: Secondary | ICD-10-CM

## 2014-11-14 NOTE — Therapy (Signed)
[facility] [address] [place], [place], [address] Phone: [phone]   Fax:  [phone]  Pediatric Physical Therapy Treatment  Patient Details  Name: Yvonne Salazar MRN: [id number] Date of Birth: [birth year]  Encounter date: 11/14/2014      End of Session - 11/14/14 0923    Visit Number 331   Authorization Type UHC   Authorization Time Period next recertification is due on 02/20/15   Authorization - Visit Number 13   Authorization - Number of Visits 60   PT Start Time 0830   PT Stop Time 0900   PT Time Calculation (min) 30 min   Activity Tolerance Patient tolerated treatment well   Behavior During Therapy Willing to participate   Activity Tolerance Patient tolerated treatment well      History reviewed. No pertinent past medical history.  History reviewed. No pertinent past surgical history.  There were no vitals taken for this visit.  Visit Diagnosis:Flexion contractures  Decreased strength, endurance, and mobility  Pain  Limited mobility  Posture abnormality           Pediatric PT Treatment - 11/14/14 0815    Subjective Information   Patient Comments Yvonne Salazar reports have a single short episode of shooting pain above her right clavicle.  Yvonne Salazar will be traveling with her family next week for the holidays.   ROM   Knee Extension(hamstrings) Passively stretched bilateral hamstrings in her chair for 30-60 seconds at a time, 3-4 times each side.   Comment Yvonne Salazar's neck was passively stretched out of right lateral flexion and shoulder elevation, while reclined in chair.  Right upper trapezius, levator scapulae, and SCM muscles were stretched and massaged.   Pain   Pain Assessment No/denies pain           Patient Education - 11/14/14 0921    Education Provided Yes   Education Description Asked Yvonne Salazar to work on moving out of right lateral flexion, reclining, and keeping her feet on thier respective foot  rests during her travels.   Person(s) Educated Patient;Mother   Method Education Verbal explanation;Observed session   Comprehension Verbalized understanding              Plan - 11/14/14 0926    PT plan Yvonne Salazar's next appointment was cancelled due to the holiday season.  PT will resume every other week sessions to increase Yvonne Salazar's endurance, strength, and postural control.                      Problem List There are no active problems to display for this patient.   Lina SayreMcNamara, Lucill Mauck 11/14/2014, 9:31 AM

## 2014-11-28 ENCOUNTER — Ambulatory Visit: Payer: 59 | Admitting: Physical Therapy

## 2014-12-12 ENCOUNTER — Ambulatory Visit: Payer: 59 | Attending: Pediatrics | Admitting: Physical Therapy

## 2014-12-12 ENCOUNTER — Encounter: Payer: Self-pay | Admitting: Physical Therapy

## 2014-12-12 DIAGNOSIS — R52 Pain, unspecified: Secondary | ICD-10-CM

## 2014-12-12 DIAGNOSIS — R6889 Other general symptoms and signs: Secondary | ICD-10-CM

## 2014-12-12 DIAGNOSIS — R293 Abnormal posture: Secondary | ICD-10-CM | POA: Diagnosis not present

## 2014-12-12 DIAGNOSIS — Z7409 Other reduced mobility: Secondary | ICD-10-CM | POA: Insufficient documentation

## 2014-12-12 DIAGNOSIS — M2450 Contracture, unspecified joint: Secondary | ICD-10-CM | POA: Diagnosis not present

## 2014-12-12 DIAGNOSIS — R531 Weakness: Secondary | ICD-10-CM

## 2014-12-12 NOTE — Therapy (Signed)
[facility] [address] [place], [place], [address] Phone: [phone]   Fax:  [phone]  Pediatric Physical Therapy Treatment  Patient Details  Name: Yvonne Salazar MRN: [id number] Date of Birth: 09-Nov-1991 Referring Provider:  Emeterio Reeve, MD  Encounter date: 12/12/2014      End of Session - 12/12/14 0906    Visit Number 332   Authorization Type UHC   Authorization Time Period next recertification is due on 02/20/15   Authorization - Visit Number 14   Authorization - Number of Visits 60   Activity Tolerance Patient tolerated treatment well   Behavior During Therapy Willing to participate   Activity Tolerance Patient tolerated treatment well      History reviewed. No pertinent past medical history.  History reviewed. No pertinent past surgical history.  There were no vitals taken for this visit.  Visit Diagnosis:Flexion contractures  Decreased strength, endurance, and mobility  Pain  Limited mobility  Posture abnormality                  Pediatric PT Treatment - 12/12/14 0902    Subjective Information   Patient Comments Yvonne Salazar had a very long night because her dog did not sleep well after surgery, so very tired and this is why she was also running late.  Opted to work in her wheelchair only.   PT Pediatric Exercise/Activities   Strengthening Activities Breathing exercises were the focus of [name] program today.  She performed four trials of rapid pursed lip inhale/exhales, X 15 first 3 trials and X 10 last trial.  Yvonne Salazar also worked on forced exhalations, with PT emphasizing increased breath or "whoosh" sound.  She then performed large whoosh exhalation, slow four count inhalation with breat holding at top of inhale, X 4 trials.   Pain   Pain Assessment No/denies pain                 Patient Education - 12/12/14 0905    Education Provided Yes   Education Description  Forced exhalations, work up to 10 consecutive if able, to be performed daily.   Person(s) Educated Patient;Mother   Method Education Verbal explanation;Observed session   Comprehension Returned demonstration          Peds PT Short Term Goals - 10/03/14 1239    PEDS PT  SHORT TERM GOAL #1   Title Yvonne Salazar will be able to hold head in less than 30 degree of right lateral flexion after neck stretches for at least 10 seconds.   Baseline Yvonne Salazar holds head in 45 degrees of right lateral flexion.   Time 6   Period Months   Status On-going   PEDS PT  SHORT TERM GOAL #2   Title Yvonne Salazar will have knees extended to flexion point of 45 degrees to decrease knee contractions.   Baseline Yvonne Salazar holds knees in 90 degrees, but they can be extended to 60 degrees of flexion.   Time 6   Period Months   Status On-going   PEDS PT  SHORT TERM GOAL #3   Title Yvonne Salazar will sit at the edge of mat for five minutes with no support (no learning on own extremities).   Baseline Yvonne Salazar leans after about 30 seconds.   Time 6   Period Months   Status On-going   PEDS PT  SHORT TERM GOAL #4   Title Yvonne Salazar will perform forced exhalation work (staccato breaths) X 10 consecutive exercises before taking a break to  improve respiratory control.    Baseline Yvonne [name] can do 5 forced consecutive exhalations.   Time 6   Period Months   Status On-going          Peds PT Long Term Goals - 10/03/14 1241    PEDS PT  LONG TERM GOAL #1   Title Yvonne [name] will direct a caregiver (other than family) to provide daily care for her so that she could function in the community without a family member over a multiple day period.   Baseline Yvonne [name] is not independent with directing her care, relies on parent or PT.   Time 12   Period Months   Status On-going   PEDS PT  LONG TERM GOAL #2   Title Yvonne [name] will make fully informed decisions about medical equipment and rehab equipment that can help her maximize her function.    Baseline [name] equipment needs change  as she is losing funciton.   Time 12   Period Months   Status On-going          Plan - 12/12/14 0906    Clinical Impression Statement Yvonne [name] has increased ability to work on respiratory exercises, especially in the comfort and support of her wheelchair.  She struggles with prolonged exhalation.   PT plan Yvonne [name] to continue every other week PT to promote HEP with focus on posture and respiratory endurance.      Problem List There are no active problems to display for this patient.   SAWULSKI,CARRIE 12/12/2014, 9:09 AM  [facility] [address] [place], [place], [address] Phone: [phone]   Fax:  [phone]   Everardo BealsCarrie Sawulski, PT 12/12/2014 9:09 AM Phone: [phone] Fax: [phone]

## 2014-12-26 ENCOUNTER — Ambulatory Visit: Payer: 59 | Admitting: Physical Therapy

## 2014-12-26 ENCOUNTER — Encounter: Payer: Self-pay | Admitting: Physical Therapy

## 2014-12-26 DIAGNOSIS — R293 Abnormal posture: Secondary | ICD-10-CM

## 2014-12-26 DIAGNOSIS — M2450 Contracture, unspecified joint: Secondary | ICD-10-CM

## 2014-12-26 DIAGNOSIS — R531 Weakness: Secondary | ICD-10-CM

## 2014-12-26 DIAGNOSIS — R52 Pain, unspecified: Secondary | ICD-10-CM

## 2014-12-26 DIAGNOSIS — Z7409 Other reduced mobility: Secondary | ICD-10-CM

## 2014-12-26 NOTE — Therapy (Signed)
[facility] [address] [place], [place], [address] Phone: [phone]   Fax:  [phone]  Pediatric Physical Therapy Treatment  Patient Details  Name: Yvonne Salazar MRN: [id number] Date of Birth: 11-06-1991 Referring Provider:  Emeterio Reeve, MD  Encounter date: 12/26/2014      End of Session - 12/26/14 0901    Visit Number 333   Authorization Type UHC   Authorization Time Period next recertification is due on 02/20/15   Authorization - Visit Number 15   Authorization - Number of Visits 60   PT Start Time 0820   PT Stop Time 0855   PT Time Calculation (min) 35 min   Activity Tolerance Patient tolerated treatment well   Behavior During Therapy Willing to participate   Activity Tolerance Patient tolerated treatment well      History reviewed. No pertinent past medical history.  History reviewed. No pertinent past surgical history.  There were no vitals taken for this visit.  Visit Diagnosis:Flexion contractures  Decreased strength, endurance, and mobility  Pain  Limited mobility  Posture abnormality                  Pediatric PT Treatment - 12/26/14 0857    Subjective Information   Patient Comments Yvonne Salazar just one a big award at [employer] for [employer] in [employer], she is very excited and proud.  She did exercises/HEP from last visit, but "not as much as you wanted".     PT Pediatric Exercise/Activities   Strengthening Activities Breathing exercise today, reviewed last session HEP and then changed exercise to inhale, hold at top and then exhale.     Activities Performed   Core Stability Details Yvonne Salazar sat edge of mat on a pillow with feet supported and leaned left to rigth (to try and get hair off of right eye).  She had intermittent minimal support.   Gross Motor Activities   Comment Transferred from w/c to mat and back with total assist.     ROM   Knee  Extension(hamstrings) Stretched in chair, contract/relax, X 10 each.   Ankle DF Performed ankle pumps X 1 minute in chair.     Pain   Pain Assessment No/denies pain  she did ask that knees not be so flexed during transfer                 Patient Education - 12/26/14 0900    Education Provided Yes   Education Description Inhale, hold, then exhale, to be performed each day.   Person(s) Educated Patient;Mother   Method Education Verbal explanation;Observed session   Comprehension Returned demonstration          Peds PT Short Term Goals - 10/03/14 1239    PEDS PT  SHORT TERM GOAL #1   Title Yvonne Salazar will be able to hold head in less than 30 degree of right lateral flexion after neck stretches for at least 10 seconds.   Baseline Yvonne Salazar holds head in 45 degrees of right lateral flexion.   Time 6   Period Months   Status On-going   PEDS PT  SHORT TERM GOAL #2   Title Yvonne Salazar will have knees extended to flexion point of 45 degrees to decrease knee contractions.   Baseline Yvonne Salazar holds knees in 90 degrees, but they can be extended to 60 degrees of flexion.   Time 6   Period Months   Status On-going   PEDS PT  SHORT TERM  GOAL #3   Title Yvonne Salazar will sit at the edge of mat for five minutes with no support (no learning on own extremities).   Baseline Yvonne Salazar leans after about 30 seconds.   Time 6   Period Months   Status On-going   PEDS PT  SHORT TERM GOAL #4   Title Yvonne Salazar will perform forced exhalation work (staccato breaths) X 10 consecutive exercises before taking a break to improve respiratory control.    Baseline Yvonne Salazar can do 5 forced consecutive exhalations.   Time 6   Period Months   Status On-going          Peds PT Long Term Goals - 10/03/14 1241    PEDS PT  LONG TERM GOAL #1   Title Yvonne Salazar will direct a caregiver (other than family) to provide daily care for her so that she could function in the community without a family member over a multiple day period.   Baseline Yvonne Salazar is not  independent with directing her care, relies on parent or PT.   Time 12   Period Months   Status On-going   PEDS PT  LONG TERM GOAL #2   Title Yvonne Salazar will make fully informed decisions about medical equipment and rehab equipment that can help her maximize her function.    Baseline Yvonne Salazar equipment needs change as she is losing funciton.   Time 12   Period Months   Status On-going          Plan - 12/26/14 0901    Clinical Impression Statement Yvonne Salazar with good understanding of HEP, but did not work on consistently enough to build endurance.     PT plan Continue PT every other week to increase Yvonne Salazar strength and stamina.      Problem List There are no active problems to display for this patient.   Colton Engdahl 12/26/2014, 9:03 AM  [facility] [address] [place], [place], [address] Phone: [phone]   Fax:  [phone]   Everardo BealsCarrie Caydence Salazar, PT 12/26/2014 9:03 AM Phone: [phone] Fax: [phone]

## 2015-01-09 ENCOUNTER — Ambulatory Visit: Payer: 59 | Attending: Pediatrics | Admitting: Physical Therapy

## 2015-01-09 ENCOUNTER — Encounter: Payer: Self-pay | Admitting: Physical Therapy

## 2015-01-09 DIAGNOSIS — R293 Abnormal posture: Secondary | ICD-10-CM | POA: Diagnosis not present

## 2015-01-09 DIAGNOSIS — R52 Pain, unspecified: Secondary | ICD-10-CM | POA: Diagnosis not present

## 2015-01-09 DIAGNOSIS — R531 Weakness: Secondary | ICD-10-CM

## 2015-01-09 DIAGNOSIS — Z7409 Other reduced mobility: Secondary | ICD-10-CM | POA: Diagnosis not present

## 2015-01-09 DIAGNOSIS — R6889 Other general symptoms and signs: Secondary | ICD-10-CM

## 2015-01-09 DIAGNOSIS — M2450 Contracture, unspecified joint: Secondary | ICD-10-CM | POA: Diagnosis not present
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
## 2015-01-23 ENCOUNTER — Encounter: Payer: Self-pay | Admitting: Physical Therapy

## 2015-01-23 ENCOUNTER — Ambulatory Visit: Payer: 59 | Admitting: Physical Therapy

## 2015-01-23 DIAGNOSIS — R52 Pain, unspecified: Secondary | ICD-10-CM

## 2015-01-23 DIAGNOSIS — R293 Abnormal posture: Secondary | ICD-10-CM

## 2015-01-23 DIAGNOSIS — Z7409 Other reduced mobility: Secondary | ICD-10-CM

## 2015-01-23 DIAGNOSIS — R6889 Other general symptoms and signs: Secondary | ICD-10-CM

## 2015-01-23 DIAGNOSIS — R531 Weakness: Secondary | ICD-10-CM

## 2015-01-23 DIAGNOSIS — M2450 Contracture, unspecified joint: Secondary | ICD-10-CM

## 2015-01-23 NOTE — Therapy (Signed)
[facility] [address] [place], [place], [address] Phone: [phone]   Fax:  [phone]  Pediatric Physical Therapy Treatment  Patient Details  Name: Yvonne Salazar MRN: [id number] Date of Birth: 04-Jan-1991 Referring Provider:  Emeterio ReeveWolters, Sharon A, MD  Encounter date: 01/23/2015      End of Session - 01/23/15 1000    Visit Number 335   Authorization Type UHC   Authorization Time Period next recertification is due on 02/20/15   Authorization - Visit Number 17   Authorization - Number of Visits 60   PT Start Time 0820   PT Stop Time 0905   PT Time Calculation (min) 45 min   Activity Tolerance Patient tolerated treatment well   Behavior During Therapy Willing to participate   Activity Tolerance Patient tolerated treatment well      History reviewed. No pertinent past medical history.  History reviewed. No pertinent past surgical history.  There were no vitals taken for this visit.  Visit Diagnosis:Flexion contractures  Decreased strength, endurance, and mobility  Pain  Posture abnormality                  Pediatric PT Treatment - 01/23/15 0905    Subjective Information   Patient Comments Yvonne [name] has to read a poem tonight at [employer], nervous and practiced for PT.   Activities Performed   Comment Yvonne [name] practiced reading poem 12 lines long with minimal breathing breaks.   Core Stability Details Breathing exercises, staccato breaths, X10 reps, 4 tirals leaning in chiar, 2 trials of 10 leaning forward and then 2 more forward.   Gross Motor Activities   Supine/Flexion Had Key[employer]Kate lean forward from wheelchair back at different angles (chair at 90-90 and tilted back to about 20 degrees).   ROM   Ankle DF Stretched for a total of 15 minutes wiht brief breaks (less than 20 seconds at a time). right SCM and encoruaged K to actively look into right rotation and upward while PT focused on  stretching neck out of right lateral flexion.   Comment Provided distraction of UE's bilaterally with focus on shoulders.     Pain   Pain Assessment FLACC  1 out of 10 during UE stretches.                 Patient Education - 01/23/15 1000    Education Provided Yes   Education Description Asked Yvonne [name] to work on staccato breaths while not leaning back in wheelchair.   Person(s) Educated Patient;Mother   Method Education Verbal explanation;Observed session;Demonstration   Comprehension Returned demonstration          Peds PT Short Term Goals - 10/03/14 1239    PEDS PT  SHORT TERM GOAL #1   Title Yvonne [name] will be able to hold head in less than 30 degree of right lateral flexion after neck stretches for at least 10 seconds.   Baseline Yvonne [name] holds head in 45 degrees of right lateral flexion.   Time 6   Period Months   Status On-going   PEDS PT  SHORT TERM GOAL #2   Title Yvonne [name] will have knees extended to flexion point of 45 degrees to decrease knee contractions.   Baseline Yvonne [name] holds knees in 90 degrees, but they can be extended to 60 degrees of flexion.   Time 6   Period Months   Status On-going   PEDS PT  SHORT TERM GOAL #3   Title Yvonne [name] will sit at  the edge of mat for five minutes with no support (no learning on own extremities).   Baseline Yvonne Salazar leans after about 30 seconds.   Time 6   Period Months   Status On-going   PEDS PT  SHORT TERM GOAL #4   Title Yvonne Salazar will perform forced exhalation work (staccato breaths) X 10 consecutive exercises before taking a break to improve respiratory control.    Baseline Yvonne Salazar can do 5 forced consecutive exhalations.   Time 6   Period Months   Status On-going          Peds PT Long Term Goals - 10/03/14 1241    PEDS PT  LONG TERM GOAL #1   Title Yvonne Salazar will direct a caregiver (other than family) to provide daily care for her so that she could function in the community without a family member over a multiple day period.   Baseline Yvonne Salazar is  not independent with directing her care, relies on parent or PT.   Time 12   Period Months   Status On-going   PEDS PT  LONG TERM GOAL #2   Title Yvonne Salazar will make fully informed decisions about medical equipment and rehab equipment that can help her maximize her function.    Baseline Yvonne Salazar's equipment needs change as she is losing funciton.   Time 12   Period Months   Status On-going          Plan - 01/23/15 1000    Clinical Impression Statement Yvonne Salazar with increased endurance for breathing exercises (more trials without rest breaks).  She does tend to have more asymmetric neck posture when working against gravity (without support of chair back).   PT plan Continue PT every other week to increase Yvonne Salazar's comfort.      Problem List There are no active problems to display for this patient.   SAWULSKI,CARRIE 01/23/2015, 10:02 AM  [facility] [address] [place], [place], [address] Phone: [phone]   Fax:  [phone]   Everardo Beals, PT 01/23/2015 10:02 AM Phone: [phone] Fax: [phone]

## 2015-02-06 ENCOUNTER — Ambulatory Visit: Payer: 59 | Attending: Pediatrics | Admitting: Physical Therapy

## 2015-02-06 ENCOUNTER — Encounter: Payer: Self-pay | Admitting: Physical Therapy

## 2015-02-06 DIAGNOSIS — R293 Abnormal posture: Secondary | ICD-10-CM | POA: Diagnosis not present

## 2015-02-06 DIAGNOSIS — R52 Pain, unspecified: Secondary | ICD-10-CM

## 2015-02-06 DIAGNOSIS — R6889 Other general symptoms and signs: Secondary | ICD-10-CM

## 2015-02-06 DIAGNOSIS — R531 Weakness: Secondary | ICD-10-CM

## 2015-02-06 DIAGNOSIS — Z7409 Other reduced mobility: Secondary | ICD-10-CM | POA: Insufficient documentation

## 2015-02-06 DIAGNOSIS — M2450 Contracture, unspecified joint: Secondary | ICD-10-CM

## 2015-02-06 NOTE — Therapy (Signed)
[facility] [address] [place], [place], [address] Phone: [phone]   Fax:  [phone]  Pediatric Physical Therapy Treatment  Patient Details  Name: Harvin HazelKatherine G Mirando MRN: [id number] Date of Birth: 1991/04/14 Referring Provider:  Mila PalmerWolters, Sharon, MD  Encounter date: 02/06/2015      End of Session - 02/06/15 0955    Visit Number 336   Authorization Type UHC   Authorization Time Period next recertification is due on 02/20/15   Authorization - Visit Number 18   Authorization - Number of Visits 60   PT Start Time 0824   PT Stop Time 0901   PT Time Calculation (min) 37 min   Activity Tolerance Patient tolerated treatment well   Behavior During Therapy Willing to participate   Activity Tolerance Patient tolerated treatment well      History reviewed. No pertinent past medical history.  History reviewed. No pertinent past surgical history.  There were no vitals taken for this visit.  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  She experiences fatigue in the right side of her neck.  She has a contraption at home so that she can rest her head on a piece of foam when working at desk.   PT Pediatric Exercise/Activities   Strengthening Activities Yvonne Salazar moved arms, active assistance, for "air boxing", x 1.5 minutes, X 2 trials.   ROM   Comment [name] neck was stretched after utilizing moist head on right shoulder and neck for 10 minutes.  She also performed active right lateral flexion X 10, and then was stretched into left lateral flexion X 1 minute, 2 trials.  [name] SCM on right, traps and scalenes were massaged for abaout 15 minutes during treatment.   Pain   Pain Assessment No/denies  pain  Yvonne Salazar felt that her trunk was "stretched" during neck stretch                 Patient Education - 02/06/15 0955    Education Provided Yes   Education Description Encouraged Yvonne Salazar to use heat on neck muscles, especially when working (on her art).   Person(s) Educated Patient;Mother   Method Education Verbal explanation;Observed session   Comprehension Verbalized understanding          Peds PT Short Term Goals - 10/03/14 1239    PEDS PT  SHORT TERM GOAL #1   Title Yvonne Salazar will be able to hold head in less than 30 degree of right lateral flexion after neck stretches for at least 10 seconds.   Baseline Yvonne Salazar holds head in 45 degrees of right lateral flexion.   Time 6   Period Months   Status On-going   PEDS PT  SHORT TERM GOAL #2   Title Yvonne Salazar will have knees extended to flexion point of 45 degrees to decrease knee contractions.   Baseline Yvonne Salazar holds knees in 90 degrees, but they can be extended to 60 degrees of flexion.   Time 6   Period Months   Status On-going   PEDS PT  SHORT TERM GOAL #3   Title Yvonne Salazar will sit at the edge of mat for five minutes with no support (no learning on own extremities).   Baseline Yvonne Salazar  leans after about 30 seconds.   Time 6   Period Months   Status On-going   PEDS PT  SHORT TERM GOAL #4   Title Yvonne Dire will perform forced exhalation work (staccato breaths) X 10 consecutive exercises before taking a break to improve respiratory control.    Baseline Yvonne Dire can do 5 forced consecutive exhalations.   Time 6   Period Months   Status On-going          Peds PT Long Term Goals - 10/03/14 1241    PEDS PT  LONG TERM GOAL #1   Title Yvonne Dire will direct a caregiver (other than family) to provide daily care for her so that she could function in the community without a family member over a multiple day period.   Baseline Yvonne Dire is not independent with directing her care, relies on parent or PT.   Time 12   Period Months   Status On-going   PEDS PT  LONG  TERM GOAL #2   Title Yvonne Dire will make fully informed decisions about medical equipment and rehab equipment that can help her maximize her function.    Baseline [name] equipment needs change as she is losing funciton.   Time 12   Period Months   Status On-going          Plan - 02/06/15 0956    Clinical Impression Statement Yvonne Dire will likely not gain range of motin in neck, but is not growing increasingly tight.  She is motivated to work on neck flexibility and strengthening to avoid head aches and fatigue when working.   PT plan Continue PT every other week to maximize strength.      Problem List There are no active problems to display for this patient.   SAWULSKI,CARRIE 02/06/2015, 9:58 AM  [facility] [address] [place], [place], [address] Phone: [phone]   Fax:  [phone]   Everardo Beals, PT 02/06/2015 9:58 AM Phone: [phone] Fax: [phone]

## 2015-02-20 ENCOUNTER — Encounter: Payer: Self-pay | Admitting: Physical Therapy

## 2015-02-20 ENCOUNTER — Ambulatory Visit: Payer: 59 | Admitting: Physical Therapy

## 2015-02-20 DIAGNOSIS — R531 Weakness: Secondary | ICD-10-CM

## 2015-02-20 DIAGNOSIS — M2450 Contracture, unspecified joint: Secondary | ICD-10-CM | POA: Diagnosis not present

## 2015-02-20 DIAGNOSIS — R6889 Other general symptoms and signs: Secondary | ICD-10-CM

## 2015-02-20 DIAGNOSIS — R293 Abnormal posture: Secondary | ICD-10-CM

## 2015-02-20 DIAGNOSIS — Z7409 Other reduced mobility: Secondary | ICD-10-CM

## 2015-02-20 NOTE — Therapy (Signed)
[place], [place], [address] Phone: [phone]   Fax:  [phone]  Pediatric Physical Therapy Treatment  Patient Details  Name: Yvonne Salazar MRN: [id number] Date of Birth: May 09, 1991 Referring Provider:  Jonathon Jordan, MD  Encounter date: 02/20/2015      End of Session - 02/20/15 1022    Visit Number [place] Time Period next recertification is due on 08/23/15   Authorization - Visit Number 73   Authorization - Number of Visits 31   PT Start Time 6203   PT Stop Time 0900   PT Time Calculation (min) 36 min   Activity Tolerance Patient tolerated treatment well   Behavior During Therapy Willing to participate   Activity Tolerance Patient tolerated treatment well      History reviewed. No pertinent past medical history.  History reviewed. No pertinent past surgical history.  There were no vitals filed for this visit.  Visit Diagnosis:Decreased strength, endurance, and mobility - Plan: PT plan of care cert/re-cert  Flexion contractures - Plan: PT plan of care cert/re-cert  Posture abnormality - Plan: PT plan of care cert/re-cert  Limited mobility - Plan: PT plan of care cert/re-cert                  Pediatric PT Treatment - 02/20/15 0906    Subjective Information   Patient Comments Yvonne Salazar is feeling a little tired; seasonal allergies are bothering her.     Activities Performed   Core Stability Details Inhalation through nose, then hummed out for exhalation, 5 reps, X 4 trials, growing louder and stronger as we practiced.  This was done sitting edge of mat with feet supported.   Gross Motor Activities   Comment Yvonne Salazar sat edge of mat with feet supported and intermittent assistance at UE's when fatigued, X 15 minutes.   Therapeutic Activities   Therapeutic Activity Details Yvonne Salazar transferred in and out of wheelchair with total  assist, K verbalizing to this caregiver adjustments to make for her comfort (K prefers to be held at waist vs under bottom/hips).   ROM   Hip Abduction and ER Sitting in wheelchair, K "butterflied" legs pushing out and in rapidly X 2 minutes.   Knee Extension(hamstrings) Stretched from wheelchair, gently, with active resistance to fatigue antagonists.   Ankle DF With knees slightly extended, Yvonne Salazar performed ankle pumps about two minutes.   Pain   Pain Assessment No/denies pain                 Patient Education - 02/20/15 1021    Education Provided Yes   Education Description Inhalation then exhalation, hum, X 5 reps, 4-5 trials with goal of increasing exhalation time; to be practiced in wheelchair, preferably daily.   Person(s) Educated Patient;Mother   Method Education Verbal explanation;Observed session;Demonstration   Comprehension Returned demonstration          Peds PT Short Term Goals - 02/20/15 1025    PEDS PT  SHORT TERM GOAL #1   Title Yvonne Salazar will be able to hold head in less than 30 degree of right lateral flexion after neck stretches for at least 10 seconds.   Baseline passively can be moved to 30 degrees, but cannot maintain   Status Not Met   PEDS PT  SHORT TERM GOAL #2   Title Yvonne Salazar will have knees extended to flexion point of 45 degrees to decrease knee contractions.   Baseline  passively achieved, but with significant stretch   Status New   PEDS PT  SHORT TERM GOAL #3   Title Yvonne Salazar will sit at the edge of mat for five minutes with no support (no learning on own extremities).   Status Achieved   PEDS PT  SHORT TERM GOAL #4   Baseline Yvonne Salazar can do 5 forced consecutive exhalations.   Status Achieved   PEDS PT  SHORT TERM GOAL #5   Title Yvonne Salazar will be able to maintain a vocal "humming" exhalation for greater than 5 seconds.   Baseline exhales for about 2 seconds   Time 6   Period Months   Status New   Additional Short Term Goals   Additional Short Term Goals  Yes   PEDS PT  SHORT TERM GOAL #6   Title Yvonne Salazar will be able to sit and turn about 45 degrees to her right to see someone sitting behind her.   Baseline Yvonne Salazar cannot look over her right shoulder (beyond neutral).   Time 6   Period Months   Status New   PEDS PT  SHORT TERM GOAL #7   Title Yvonne Salazar will be able to lift toes from wheelchair when she is sitting with her knees flexed.   Baseline Needs to extend legs to actively lift toes.   Time 6   Period Months   Status New          Peds PT Long Term Goals - 10/03/14 1241    PEDS PT  LONG TERM GOAL #1   Title Yvonne Salazar will direct a caregiver (other than family) to provide daily care for her so that she could function in the community without a family member over a multiple day period.   Baseline Yvonne Salazar is not independent with directing her care, relies on parent or PT.   Time 12   Period Months   Status On-going   PEDS PT  LONG TERM GOAL #2   Title Yvonne Salazar will make fully informed decisions about medical equipment and rehab equipment that can help her maximize her function.    Baseline Yvonne Salazar's equipment needs change as she is losing funciton.   Time 12   Period Months   Status On-going          Plan - 02/20/15 1023    Clinical Impression Statement Yvonne Salazar has worked on sitting balance, breathing exercises and not losing range of motion in tight joints (neck, right shoulder and elbow and hips and knees), so that she met most recent goals.     Patient will benefit from treatment of the following deficits: Decreased ability to maintain good postural alignment;Decreased ability to participate in recreational activities;Decreased function at home and in the community   Rehab Potential Excellent   Clinical impairments affecting rehab potential N/A   PT Frequency Every other week   PT Duration 6 months   PT Treatment/Intervention Therapeutic activities;Therapeutic exercises;Neuromuscular reeducation;Patient/family education;Wheelchair management;Manual  techniques;Instruction proper posture/body mechanics;Self-care and home management   PT plan Continue PT every other week to increase Yvonne Salazar's endurance and comfort and avoid worsening postural deformity.      Problem List There are no active problems to display for this patient.   Moreen Piggott 02/20/2015, 12:21 PM  [place], [place], [address] Phone: [phone]   Fax:  [place] 02/20/2015 12:21 PM Phone: [phone] Fax: [phone]

## 2015-03-06 ENCOUNTER — Encounter: Payer: Self-pay | Admitting: Physical Therapy

## 2015-03-06 ENCOUNTER — Ambulatory Visit: Payer: 59 | Attending: Pediatrics | Admitting: Physical Therapy

## 2015-03-06 DIAGNOSIS — Z7409 Other reduced mobility: Secondary | ICD-10-CM | POA: Insufficient documentation

## 2015-03-06 DIAGNOSIS — M2450 Contracture, unspecified joint: Secondary | ICD-10-CM | POA: Insufficient documentation

## 2015-03-06 DIAGNOSIS — R293 Abnormal posture: Secondary | ICD-10-CM | POA: Insufficient documentation

## 2015-03-06 DIAGNOSIS — R531 Weakness: Secondary | ICD-10-CM

## 2015-03-06 DIAGNOSIS — R52 Pain, unspecified: Secondary | ICD-10-CM | POA: Insufficient documentation

## 2015-03-06 NOTE — Therapy (Signed)
[place], [place], [address] Phone: [phone]   Fax:  [phone]  Pediatric Physical Therapy Treatment  Patient Details  Name: Yvonne Salazar MRN: [id number] Date of Birth: 1991/02/11 Referring Provider:  Jonathon Jordan, MD  Encounter date: 03/06/2015      End of Session - 03/06/15 0912    Visit Number 103   Authorization Type U[employer]   Authorization Time Period next recertification is due on 08/23/15   Authorization - Visit Number 83   Authorization - Number of Visits 60   PT Start Time 0823   PT Stop Time 0905   PT Time Calculation (min) 42 min   Activity Tolerance Patient tolerated treatment well   Behavior During Therapy Willing to participate   Activity Tolerance Patient tolerated treatment well      History reviewed. No pertinent past medical history.  History reviewed. No pertinent past surgical history.  There were no vitals filed for this visit.  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     PT Pediatric Exercise/Activities   Strengthening Activities Yvonne Salazar worked in Geophysicist/field seismologist.  She performed super sets of dorsiflexion/ankle pumps with red theraband with knee flexed and then extended to end-ranges, about 5 minutes each side.  She also marched in place, lifting each leg 10 reps in a row, requiring assist for left LE, 2 sets.  She performed seated hip adduction and abduction X 25.   ROM   Knee Extension(hamstrings) Stretched to end-ranges in chair, together and seperately, about 10 minutes of strethcing total.   Comment Massaged quads after LE work about 10 minutes.   Pain   Pain Assessment 0-10  up to 6 in  right knee/thigh/quads after hamstring work                 Patient Education - 03/06/15 0911    Education Provided Yes   Education Description Discussed importance and ease of LE ROM in wheelchair.     Person(s) Educated Patient;Mother   Method Education Verbal explanation;Observed session;Demonstration   Comprehension Returned demonstration          Peds PT Short Term Goals - 02/20/15 1025    PEDS PT  SHORT TERM GOAL #1   Title Yvonne Salazar will be able to hold head in less than 30 degree of right lateral flexion after neck stretches for at least 10 seconds.   Baseline passively can be moved to 30 degrees, but cannot maintain   Status Not Met   PEDS PT  SHORT TERM GOAL #2   Title Yvonne Salazar will have knees extended to flexion point of 45 degrees to decrease knee contractions.   Baseline passively achieved, but with significant stretch   Status New   PEDS PT  SHORT TERM GOAL #3   Title Yvonne Salazar will sit at the edge of mat for five minutes with no support (no learning on own extremities).   Status Achieved   PEDS PT  SHORT TERM GOAL #4   Baseline Yvonne Salazar can do 5 forced consecutive exhalations.   Status Achieved   PEDS PT  SHORT TERM GOAL #5   Title Yvonne Salazar will be able to  maintain a vocal "humming" exhalation for greater than 5 seconds.   Baseline exhales for about 2 seconds   Time 6   Period Months   Status New   Additional Short Term Goals   Additional Short Term Goals Yes   PEDS PT  SHORT TERM GOAL #6   Title Yvonne Salazar will be able to sit and turn about 45 degrees to her right to see someone sitting behind her.   Baseline Yvonne Salazar cannot look over her right shoulder (beyond neutral).   Time 6   Period Months   Status New   PEDS PT  SHORT TERM GOAL #7   Title Yvonne Salazar will be able to lift toes from wheelchair when she is sitting with her knees flexed.   Baseline Needs to extend legs to actively lift toes.   Time 6   Period Months   Status New          Peds PT Long Term Goals - 10/03/14  1241    PEDS PT  LONG TERM GOAL #1   Title Yvonne Salazar will direct a caregiver (other than family) to provide daily care for her so that she could function in the community without a family member over a multiple day period.   Baseline Yvonne Salazar is not independent with directing her care, relies on parent or PT.   Time 12   Period Months   Status On-going   PEDS PT  LONG TERM GOAL #2   Title Yvonne Salazar will make fully informed decisions about medical equipment and rehab equipment that can help her maximize her function.    Baseline Yvonne Salazar's equipment needs change as she is losing funciton.   Time 12   Period Months   Status On-going          Plan - 03/06/15 0912    Clinical Impression Statement Yvonne Salazar demonstrates increased tightness of left LE and lack of movement against gravity or through a fuller range of motion.  She benefits from continued acctive assisted work to prevent worsening contractures.   PT plan Continue PT every other week to maximize AROM.      Problem List There are no active problems to display for this patient.   SAWULSKI,CARRIE 03/06/2015, [place], [place], [address] Phone: [phone]   Fax:  [place], PT 03/06/2015 9:15 AM Phone: [phone] Fax: [phone]

## 2015-03-20 ENCOUNTER — Ambulatory Visit: Payer: 59 | Admitting: Physical Therapy

## 2015-03-20 ENCOUNTER — Encounter: Payer: Self-pay | Admitting: Physical Therapy

## 2015-03-20 DIAGNOSIS — Z7409 Other reduced mobility: Secondary | ICD-10-CM

## 2015-03-20 DIAGNOSIS — R531 Weakness: Secondary | ICD-10-CM

## 2015-03-20 DIAGNOSIS — M2450 Contracture, unspecified joint: Secondary | ICD-10-CM

## 2015-03-20 DIAGNOSIS — R293 Abnormal posture: Secondary | ICD-10-CM

## 2015-03-20 DIAGNOSIS — R6889 Other general symptoms and signs: Secondary | ICD-10-CM

## 2015-03-20 NOTE — Therapy (Signed)
[place], [place], [address] Phone: [phone]   Fax:  [phone]  Pediatric Physical Therapy Treatment  Patient Details  Name: Yvonne Salazar MRN: [id number] Date of Birth: 09-20-1991 Referring Provider:  Jonathon Jordan, MD  Encounter date: 03/20/2015      End of Session - 03/20/15 1353    Visit Number [place] Time Period next recertification is due on 08/23/15   Authorization - Visit Number 21   Authorization - Number of Visits 60   PT Start Time 0821   PT Stop Time 0901   PT Time Calculation (min) 40 min   Activity Tolerance Patient tolerated treatment well   Behavior During Therapy Willing to participate   Activity Tolerance Patient tolerated treatment well      History reviewed. No pertinent past medical history.  History reviewed. No pertinent past surgical history.  There were no vitals filed for this visit.  Visit Diagnosis:Flexion contractures  Decreased strength, endurance, and mobility  Posture abnormality                    Pediatric PT Treatment - 03/20/15 1350    Subjective Information   Patient Comments Yvonne Salazar reports that her right shoulder blade and shoulder is sore after project on computer for [employer].   PT Pediatric Exercise/Activities   Strengthening Activities Yvonne Salazar worked on Presenter, broadcasting, AAROM, each arm X 10.   Activities Performed   Core Stability Details Exhalation focus, pursed lips.    ROM   Hip Abduction and ER Stretched in sitting in wheelchair.   Knee Extension(hamstrings) Passive stretches from wheelchair.   Ankle DF Active.   Comment Massaged right interscapular muscles.   Pain   Pain Assessment No/denies pain                 Patient Education - 03/20/15 1352    Education Provided Yes   Education Description Pursed lip exhalations, daily, after brushing teeth,  with focus on increasing time for exhalation.   Person(s) Educated Patient;Mother   Method Education Verbal explanation;Observed session;Demonstration   Comprehension Returned demonstration          Peds PT Short Term Goals - 02/20/15 1025    PEDS PT  SHORT TERM GOAL #1   Title Yvonne Salazar will be able to hold head in less than 30 degree of right lateral flexion after neck stretches for at least 10 seconds.   Baseline passively can be moved to 30 degrees, but cannot maintain   Status Not Met   PEDS PT  SHORT TERM GOAL #2   Title Yvonne Salazar will have knees extended to flexion point of 45 degrees to decrease knee contractions.   Baseline passively achieved, but with significant stretch   Status New   PEDS PT  SHORT TERM GOAL #3   Title Yvonne Salazar will sit at the edge of mat for five minutes with no support (no learning on own extremities).   Status Achieved   PEDS PT  SHORT TERM GOAL #4   Baseline Yvonne Salazar can do 5 forced consecutive exhalations.   Status Achieved   PEDS PT  SHORT TERM GOAL #5   Title Yvonne Salazar will be able to maintain a vocal "humming" exhalation for greater than 5 seconds.   Baseline exhales for about 2 seconds   Time 6   Period Months   Status New   Additional Short Term Goals  Additional Short Term Goals Yes   PEDS PT  SHORT TERM GOAL #6   Title Yvonne Salazar will be able to sit and turn about 45 degrees to her right to see someone sitting behind her.   Baseline Yvonne Salazar cannot look over her right shoulder (beyond neutral).   Time 6   Period Months   Status New   PEDS PT  SHORT TERM GOAL #7   Title Yvonne Salazar will be able to lift toes from wheelchair when she is sitting with her knees flexed.   Baseline Needs to extend legs to actively lift toes.   Time 6   Period Months   Status New          Peds PT Long Term Goals - 10/03/14 1241    PEDS PT  LONG TERM GOAL #1   Title Yvonne Salazar will direct a caregiver (other than family) to provide daily care for her so that she could function in the community  without a family member over a multiple day period.   Baseline Yvonne Salazar is not independent with directing her care, relies on parent or PT.   Time 12   Period Months   Status On-going   PEDS PT  LONG TERM GOAL #2   Title Yvonne Salazar will make fully informed decisions about medical equipment and rehab equipment that can help her maximize her function.    Baseline [name] equipment needs change as she is losing funciton.   Time 12   Period Months   Status On-going          Plan - 03/20/15 1353    Clinical Impression Statement Yvonne Salazar with tightness on right side, and her finger extension is limited when wrist is extended.   PT plan Continue PT every other week to increase AROM and strength.      Problem List There are no active problems to display for this patient.   Kathrynn Backstrom 03/20/2015, 1:55 PM  [place], [place], [address] Phone: [phone]   Fax:  [place], [place] 03/20/2015 1:55 PM Phone: [phone] Fax: [phone]

## 2015-04-03 ENCOUNTER — Ambulatory Visit: Payer: 59 | Admitting: Physical Therapy

## 2015-04-17 ENCOUNTER — Encounter: Payer: Self-pay | Admitting: Physical Therapy

## 2015-04-17 ENCOUNTER — Ambulatory Visit: Payer: 59 | Attending: Pediatrics | Admitting: Physical Therapy

## 2015-04-17 DIAGNOSIS — R293 Abnormal posture: Secondary | ICD-10-CM | POA: Diagnosis not present

## 2015-04-17 DIAGNOSIS — M2450 Contracture, unspecified joint: Secondary | ICD-10-CM | POA: Diagnosis not present

## 2015-04-17 DIAGNOSIS — R52 Pain, unspecified: Secondary | ICD-10-CM | POA: Insufficient documentation

## 2015-04-17 DIAGNOSIS — Z7409 Other reduced mobility: Secondary | ICD-10-CM | POA: Diagnosis not present

## 2015-04-17 DIAGNOSIS — R531 Weakness: Secondary | ICD-10-CM

## 2015-04-17 DIAGNOSIS — R6889 Other general symptoms and signs: Secondary | ICD-10-CM

## 2015-04-17 NOTE — Therapy (Signed)
[place], [place], [address] Phone: [phone]   Fax:  [phone]  Pediatric Physical Therapy Treatment  Patient Details  Name: Yvonne Salazar MRN: [id number] Date of Birth: [birth year] Referring Provider:  Jonathon Jordan, MD  Encounter date: 04/17/2015      End of Session - 04/17/15 1019    Visit Number [place] Time Period next recertification is due on 08/23/15   Authorization - Visit Number 59   Authorization - Number of Visits 6   PT Start Time 7342   PT Stop Time 0904   PT Time Calculation (min) 40 min   Activity Tolerance Patient tolerated treatment well   Behavior During Therapy Willing to participate   Activity Tolerance Patient tolerated treatment well      History reviewed. No pertinent past medical history.  History reviewed. No pertinent past surgical history.  There were no vitals filed for this visit.  Visit Diagnosis:Decreased strength, endurance, and mobility  Posture abnormality  Flexion contractures                    Pediatric PT Treatment - 04/17/15 1002    Subjective Information   Patient Comments Yvonne Salazar admits that she rarely wears her cock up wrist splint/hand braces any more, not because she does not tolerate them, but because she simply forgets.   Activities Performed   Core Stability Details Exhalation exercises, pursed lips, X 4 reps, holding for 1 to 3 seconds.  Humming during exhalation.  Yvonne Salazar could hold for up to 3 seconds and continue to exhale, but no vocal production after 3 seconds.   Gross Motor Activities   Comment Yvonne Salazar sat edge of mat for about 10 minutes, feet supported and PT either on K's right side or offering posterior support.     ROM   Ankle DF Active df in wheelchair with knees slightly extended (just out of 90 degrees of flexion).  Yvonne Salazar performed each side 10 times, and then held  each side up to 5 seconds.   Comment Yvonne Salazar's wrists were stretched into extension and she was asked to extend fingers.  Performed this on each wrist, total of about 2.5 minutes each.  Encouraged Yvonne Salazar to move neck out of left rotation by visually following PT to right about 30 degrees (more with eyes than neck movement).   Pain   Pain Assessment No/denies pain                 Patient Education - 04/17/15 1008    Education Provided Yes   Education Description Asked Yvonne Salazar to wear wrist splints before next session at least 2 or 3 times for about 2 hours (suggested during a movie).   Person(s) Educated Patient;Mother   Method Education Verbal explanation   Comprehension Verbalized understanding          Peds PT Short Term Goals - 02/20/15 1025    PEDS PT  SHORT TERM GOAL #1   Title Yvonne Salazar will be able to hold head in less than 30 degree of right lateral flexion after neck stretches for at least 10 seconds.   Baseline passively can be moved to 30 degrees, but cannot maintain   Status Not Met   PEDS PT  SHORT TERM GOAL #2   Title Yvonne Salazar will have knees extended to flexion point of 45 degrees to decrease knee contractions.   Baseline passively achieved, but with significant  stretch   Status New   PEDS PT  SHORT TERM GOAL #3   Title Yvonne Salazar will sit at the edge of mat for five minutes with no support (no learning on own extremities).   Status Achieved   PEDS PT  SHORT TERM GOAL #4   Baseline Yvonne Salazar can do 5 forced consecutive exhalations.   Status Achieved   PEDS PT  SHORT TERM GOAL #5   Title Yvonne Salazar will be able to maintain a vocal "humming" exhalation for greater than 5 seconds.   Baseline exhales for about 2 seconds   Time 6   Period Months   Status New   Additional Short Term Goals   Additional Short Term Goals Yes   PEDS PT  SHORT TERM GOAL #6   Title Yvonne Salazar will be able to sit and turn about 45 degrees to her right to see someone sitting behind her.   Baseline Yvonne Salazar cannot look over  her right shoulder (beyond neutral).   Time 6   Period Months   Status New   PEDS PT  SHORT TERM GOAL #7   Title Yvonne Salazar will be able to lift toes from wheelchair when she is sitting with her knees flexed.   Baseline Needs to extend legs to actively lift toes.   Time 6   Period Months   Status New          Peds PT Long Term Goals - 10/03/14 1241    PEDS PT  LONG TERM GOAL #1   Title Yvonne Salazar will direct a caregiver (other than family) to provide daily care for her so that she could function in the community without a family member over a multiple day period.   Baseline Yvonne Salazar is not independent with directing her care, relies on parent or PT.   Time 12   Period Months   Status On-going   PEDS PT  LONG TERM GOAL #2   Title Yvonne Salazar will make fully informed decisions about medical equipment and rehab equipment that can help her maximize her function.    Baseline Yvonne Salazar's equipment needs change as she is losing funciton.   Time 12   Period Months   Status On-going          Plan - 04/17/15 1019    Clinical Impression Statement Yvonne Salazar has increased tightness in wrist and hands, and cannot extend her fingers when wrist is not in a neutral position.  Her right side is generally extremely tight toward flexion.  When sitting without support, Yvonne Salazar now relies on her right side to lean in to seek some sort of support.  She fatigues on her left side.   PT plan Continue PT every other week to increase Yvonne Salazar's endurance and to improve posture.      Problem List There are no active problems to display for this patient.   Tiane Szydlowski 04/17/2015, 10:22 AM  [place], [place], [address] Phone: [phone]   Fax:  [place], [place] 04/17/2015 10:22 AM Phone: [phone] Fax: [phone]

## 2015-05-01 ENCOUNTER — Encounter: Payer: Self-pay | Admitting: Physical Therapy

## 2015-05-01 ENCOUNTER — Ambulatory Visit: Payer: 59 | Attending: Pediatrics | Admitting: Physical Therapy

## 2015-05-01 DIAGNOSIS — Z7409 Other reduced mobility: Secondary | ICD-10-CM | POA: Diagnosis present

## 2015-05-01 DIAGNOSIS — R293 Abnormal posture: Secondary | ICD-10-CM | POA: Diagnosis present

## 2015-05-01 DIAGNOSIS — M2450 Contracture, unspecified joint: Secondary | ICD-10-CM | POA: Diagnosis present

## 2015-05-01 DIAGNOSIS — R531 Weakness: Secondary | ICD-10-CM

## 2015-05-01 DIAGNOSIS — R6889 Other general symptoms and signs: Secondary | ICD-10-CM

## 2015-05-01 NOTE — Therapy (Signed)
[place], [place], [address] Phone: [phone]   Fax:  [phone]  Pediatric Physical Therapy Treatment  Patient Details  Name: Yvonne Salazar MRN: [id number] Date of Birth: 1991/07/28 Referring Provider:  Jonathon Jordan, MD  Encounter date: 05/01/2015      End of Session - 05/01/15 1007    Visit Number 341   Number of Visits 23   Date for PT Re-Evaluation 08/23/15   Authorization Type UHC   Authorization - Visit Number 23   Authorization - Number of Visits 60   PT Start Time 0820   PT Stop Time 0900   PT Time Calculation (min) 40 min   Activity Tolerance Patient tolerated treatment well   Behavior During Therapy Willing to participate   Activity Tolerance Patient tolerated treatment well      History reviewed. No pertinent past medical history.  History reviewed. No pertinent past surgical history.  There were no vitals filed for this visit.  Visit Diagnosis:Posture abnormality  Decreased strength, endurance, and mobility  Flexion contractures                    Pediatric PT Treatment - 05/01/15 1001    Subjective Information   Patient Comments Yvonne Salazar has started using splints up to 30 minutes with mild soreness at her middle fingers.     PT Pediatric Exercise/Activities   Self-care [name] righ SCM, upper trap on right and interscapular muscles were massaged with focus on right side, but some time spent on left (10 minutes total).     ROM   Hip Abduction and ER Active sitting in wheelchair, X 10 reps.   Knee Extension(hamstrings) Active, X 10 each side from wheelchair.   Ankle DF Ankle pumps with knees slightly extended X 10 each side.   Comment Yvonne Salazar also performed seated marching, X 10 alternating with some active assist on left side to avoid only pushing off with left ankle.  Yvonne Salazar also passively stretched to end range knee extension (49 degrees from  neutral on left; 55-60 degrees from neutral on right).   Pain   Pain Assessment No/denies pain  K did tel PT when end-range was reached for knee extension.                 Patient Education - 05/01/15 1006    Education Provided Yes   Education Description Quad sets, in wheelchair, X10 each side (20 total), BID.    Person(s) Educated Patient;Mother   Method Education Verbal explanation;Demonstration   Comprehension Returned demonstration          Peds PT Short Term Goals - 02/20/15 1025    PEDS PT  SHORT TERM GOAL #1   Title Yvonne Salazar will be able to hold head in less than 30 degree of right lateral flexion after neck stretches for at least 10 seconds.   Baseline passively can be moved to 30 degrees, but cannot maintain   Status Not Met   PEDS PT  SHORT TERM GOAL #2   Title Yvonne Salazar will have knees extended to flexion point of 45 degrees to decrease knee contractions.   Baseline passively achieved, but with significant stretch   Status New   PEDS PT  SHORT TERM GOAL #3   Title Yvonne Salazar will sit at the edge of mat for five minutes with no support (no learning on own extremities).   Status Achieved   PEDS PT  SHORT TERM GOAL #4  Baseline Yvonne Salazar can do 5 forced consecutive exhalations.   Status Achieved   PEDS PT  SHORT TERM GOAL #5   Title Yvonne Salazar will be able to maintain a vocal "humming" exhalation for greater than 5 seconds.   Baseline exhales for about 2 seconds   Time 6   Period Months   Status New   Additional Short Term Goals   Additional Short Term Goals Yes   PEDS PT  SHORT TERM GOAL #6   Title Yvonne Salazar will be able to sit and turn about 45 degrees to her right to see someone sitting behind her.   Baseline Yvonne Salazar cannot look over her right shoulder (beyond neutral).   Time 6   Period Months   Status New   PEDS PT  SHORT TERM GOAL #7   Title Yvonne Salazar will be able to lift toes from wheelchair when she is sitting with her knees flexed.   Baseline Needs to extend legs to actively  lift toes.   Time 6   Period Months   Status New          Peds PT Long Term Goals - 10/03/14 1241    PEDS PT  LONG TERM GOAL #1   Title Yvonne Salazar will direct a caregiver (other than family) to provide daily care for her so that she could function in the community without a family member over a multiple day period.   Baseline Yvonne Salazar is not independent with directing her care, relies on parent or PT.   Time 12   Period Months   Status On-going   PEDS PT  LONG TERM GOAL #2   Title Yvonne Salazar will make fully informed decisions about medical equipment and rehab equipment that can help her maximize her function.    Baseline [name] equipment needs change as she is losing funciton.   Time 12   Period Months   Status On-going          Plan - 05/01/15 1007    Clinical Impression Statement Yvonne Salazar demonstrates difficulty moving LE's, especially left, a-g and out of resting 90-90 degree posture.  She is able to perform seated wheelchair activiites, which could be beneficial if performed regularly.     PT plan Continue PT every other wek to increase [name] AROM and optimize movement and function.      Problem List There are no active problems to display for this patient.   SAWULSKI,CARRIE 05/01/2015, 10:12 AM  [place], [place], [address] Phone: [phone]   Fax:  [place], PT 05/01/2015 10:12 AM Phone: [phone] Fax: [phone]

## 2015-05-15 ENCOUNTER — Ambulatory Visit: Payer: 59 | Admitting: Physical Therapy

## 2015-05-29 ENCOUNTER — Encounter: Payer: Self-pay | Admitting: Physical Therapy

## 2015-05-29 ENCOUNTER — Ambulatory Visit: Payer: 59 | Admitting: Physical Therapy

## 2015-05-29 DIAGNOSIS — R6889 Other general symptoms and signs: Secondary | ICD-10-CM

## 2015-05-29 DIAGNOSIS — Z7409 Other reduced mobility: Secondary | ICD-10-CM

## 2015-05-29 DIAGNOSIS — R531 Weakness: Secondary | ICD-10-CM

## 2015-05-29 DIAGNOSIS — R293 Abnormal posture: Secondary | ICD-10-CM

## 2015-05-29 NOTE — Therapy (Signed)
[place], [place], [address] Phone: [phone]   Fax:  [phone]  Pediatric Physical Therapy Treatment  Patient Details  Name: Yvonne Salazar MRN: [id number] Date of Birth: Jan 17, 1991 Referring Provider:  Jonathon Jordan, MD  Encounter date: 05/29/2015      End of Session - 05/29/15 1226    Visit Number 342   Number of Visits 24   Date for PT Re-Evaluation 08/23/15   Authorization Type U[employer]   Authorization Time Period Next recertificaiton due 1/74/08    Authorization - Visit Number 24   Authorization - Number of Visits 60   PT Start Time 0832   PT Stop Time 0904   PT Time Calculation (min) 32 min   Activity Tolerance Patient tolerated treatment well   Behavior During Therapy Willing to participate   Activity Tolerance Patient tolerated treatment well      History reviewed. No pertinent past medical history.  History reviewed. No pertinent past surgical history.  There were no vitals filed for this visit.  Visit Diagnosis:Posture abnormality  Decreased strength, endurance, and mobility  Limited mobility                    Pediatric PT Treatment - 05/29/15 1205    Subjective Information   Patient Comments Yvonne Salazar texted to let PT know that she would be arriving late due to having to stop to fill the car up with gas.  She reports that recently a muscle that "attaches to her collar bone" has been tight. Independently, she created a stretch by looping her right arm under her WC arm rest and leaning to the left.     PT Pediatric Exercise/Activities   Strengthening Activities Yvonne Salazar elevated UE's overhead x 15.  She required assit with her L UE, and facilitatd elevation by linking hands and relying on her R UE to initiate and complete the movement.  Yvonne Salazar completed active ankle pumps bilaterally x 10, bilateral hip abduction and adduction ("butterfly legs") x 10, resisted  bilateral hip adbuction/adduction (2 fingers on outside and inside of knee) x 5, and 20 quad sets bilaterally.  All strengthening exercises were accomplished with Yvonne Salazar in her WC.  Independent with all exercises.     ROM   Comment SPT passively stretched Yvonne Salazar's neck out of right lateral flexion (2 trials x 30 sec).  SPT facilitated active isometric left lateral neck flexion by placing hand on Yvonne Salazar's head providing tactile cuing.  Yvonne Salazar was instructed to "move my hand."  Yvonne Salazar completed 10 reps.  SPT also facilitated diaphragmatic breathing.  Completed x 3 breaths anteriorly, right laterally, and left laterally, with tactile cuing at anterior aspect of diaphram, right lateral aspect, and left lateral aspect.      Pain   Pain Assessment No/denies pain                 Patient Education - 05/29/15 1223    Education Provided Yes   Education Description Patient was assigned HEP to include: continuing R SCM stretch where she loops arm under WC and stretches left and rotates right, as well as diaphragmatic breathing (5 trials x 2 each day).     Person(s) Educated Patient;Mother   Method Education Verbal explanation;Demonstration;Observed session   Comprehension Verbalized understanding          Peds PT Short Term Goals - 02/20/15 1025    PEDS PT  SHORT TERM GOAL #1   Title Yvonne Salazar will  be able to hold head in less than 30 degree of right lateral flexion after neck stretches for at least 10 seconds.   Baseline passively can be moved to 30 degrees, but cannot maintain   Status Not Met   PEDS PT  SHORT TERM GOAL #2   Title Yvonne Salazar will have knees extended to flexion point of 45 degrees to decrease knee contractions.   Baseline passively achieved, but with significant stretch   Status New   PEDS PT  SHORT TERM GOAL #3   Title Yvonne Salazar will sit at the edge of mat for five minutes with no support (no learning on own extremities).   Status Achieved   PEDS PT  SHORT TERM GOAL #4   Baseline Yvonne Salazar can do 5  forced consecutive exhalations.   Status Achieved   PEDS PT  SHORT TERM GOAL #5   Title Yvonne Salazar will be able to maintain a vocal "humming" exhalation for greater than 5 seconds.   Baseline exhales for about 2 seconds   Time 6   Period Months   Status New   Additional Short Term Goals   Additional Short Term Goals Yes   PEDS PT  SHORT TERM GOAL #6   Title Yvonne Salazar will be able to sit and turn about 45 degrees to her right to see someone sitting behind her.   Baseline Yvonne Salazar cannot look over her right shoulder (beyond neutral).   Time 6   Period Months   Status New   PEDS PT  SHORT TERM GOAL #7   Title Yvonne Salazar will be able to lift toes from wheelchair when she is sitting with her knees flexed.   Baseline Needs to extend legs to actively lift toes.   Time 6   Period Months   Status New          Peds PT Long Term Goals - 10/03/14 1241    PEDS PT  LONG TERM GOAL #1   Title Yvonne Salazar will direct a caregiver (other than family) to provide daily care for her so that she could function in the community without a family member over a multiple day period.   Baseline Yvonne Salazar is not independent with directing her care, relies on parent or PT.   Time 12   Period Months   Status On-going   PEDS PT  LONG TERM GOAL #2   Title Yvonne Salazar will make fully informed decisions about medical equipment and rehab equipment that can help her maximize her function.    Baseline Yvonne Salazar's equipment needs change as she is losing funciton.   Time 12   Period Months   Status On-going          Plan - 05/29/15 1228    Clinical Impression Statement Yvonne Salazar continues to be limited by generalized weakness, decreased endurance, and muscle tightness.  Yvonne Salazar found strengthening her L neck muscles to be very difficult and demonstrated early fatigue.     PT plan Yvonne Salazar would continue to benefit from skilled physical therapy once every other week to address deficits and increase functional independence.        Problem List There are no active  problems to display for this patient.   Meral Geissinger SPT  05/29/2015, 12:31 PM  [place], [place], [address] Phone: [phone]   Fax:  [phone]  [facility], PT was present and provided supervision entire session.    Lawerance Bach, PT 05/29/2015 12:38 PM Phone: [phone] Fax: [phone]

## 2015-06-12 ENCOUNTER — Encounter: Payer: Self-pay | Admitting: Physical Therapy

## 2015-06-12 ENCOUNTER — Ambulatory Visit: Payer: 59 | Attending: Pediatrics | Admitting: Physical Therapy

## 2015-06-12 DIAGNOSIS — Z7409 Other reduced mobility: Secondary | ICD-10-CM | POA: Insufficient documentation

## 2015-06-12 DIAGNOSIS — R293 Abnormal posture: Secondary | ICD-10-CM | POA: Diagnosis present

## 2015-06-12 DIAGNOSIS — R6889 Other general symptoms and signs: Secondary | ICD-10-CM

## 2015-06-12 DIAGNOSIS — R52 Pain, unspecified: Secondary | ICD-10-CM

## 2015-06-12 DIAGNOSIS — R531 Weakness: Secondary | ICD-10-CM

## 2015-06-12 NOTE — Therapy (Signed)
[place], [place], [address] Phone: [phone]   Fax:  [phone]  Pediatric Physical Therapy Treatment  Patient Details  Name: Yvonne Salazar MRN: [id number] Date of Birth: 1991-07-19 Referring Provider:  Jonathon Jordan, MD  Encounter date: 06/12/2015      End of Session - 06/12/15 1012    Visit Number 343   Number of Visits 25   Date for PT Re-Evaluation 08/23/15   Authorization Type U[employer]   Authorization Time Period Next recertificaiton due 03/30/76    Authorization - Visit Number 25   Authorization - Number of Visits 60   PT Start Time 0815   PT Stop Time 0900   PT Time Calculation (min) 45 min   Activity Tolerance Patient tolerated treatment well   Behavior During Therapy Willing to participate   Activity Tolerance Patient tolerated treatment well      History reviewed. No pertinent past medical history.  History reviewed. No pertinent past surgical history.  There were no vitals filed for this visit.  Visit Diagnosis:Posture abnormality  Decreased strength, endurance, and mobility  Pain                    Pediatric PT Treatment - 06/12/15 0958    Subjective Information   Patient Comments Yvonne Salazar would like to bleach her whole head, and needs to find a hairdresser who can come to the home.  She cannot lean back in a beautician's seat.     PT Pediatric Exercise/Activities   Strengthening Activities Moved UE's while in chair for protraction/retraction active assist, X 10; and alternating "running arms" X 20 reps.     Activities Performed   Core Stability Details Sat edge of mat with feet supported and leaned laterally with intermittent hand support for 10 minutes.   Therapeutic Activities   Therapeutic Activity Details Tot assist transfer in and out wheelchair, with K holding on to caregiver and offering verbal guidance.    ROM   Hip Abduction and ER  Passively moved into abduciton and ER in chair during rest breaks.    Knee Extension(hamstrings) Passively stretched hamstrings sitting in wheelchair, X 1 each side, 40 seconds each.   Comment Stretched right SCM aggressively by depressing right shoulder.  K had to "hold" movement after stretch, entire activity lasting about 5 minutes before K requested rest break via head rest.  K's right scapula was mobilized to move right GH joint.  Massage performed at bilateral traps, rhomboids and scalenes.    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     Person(s) Educated Patient;Mother   Method Education Verbal explanation;Demonstration;Observed session   Comprehension Verbalized understanding          Peds PT Short Term Goals - 02/20/15 1025    PEDS PT  SHORT TERM GOAL #1   Title Yvonne Salazar will be able to hold head in less than 30 degree of right lateral flexion after neck stretches for at least 10 seconds.   Baseline passively can be moved to 30 degrees, but cannot maintain   Status Not Met   PEDS PT  SHORT TERM GOAL #2   Title Yvonne Salazar  will have knees extended to flexion point of 45 degrees to decrease knee contractions.   Baseline passively achieved, but with significant stretch   Status New   PEDS PT  SHORT TERM GOAL #3   Title Yvonne Salazar will sit at the edge of mat for five minutes with no support (no learning on own extremities).   Status Achieved   PEDS PT  SHORT TERM GOAL #4   Baseline Yvonne Salazar can do 5 forced consecutive exhalations.   Status Achieved   PEDS PT  SHORT TERM GOAL #5   Title Yvonne Salazar will be able to maintain a vocal "humming" exhalation for greater than 5 seconds.   Baseline exhales for about 2 seconds   Time 6   Period Months   Status New    Additional Short Term Goals   Additional Short Term Goals Yes   PEDS PT  SHORT TERM GOAL #6   Title Yvonne Salazar will be able to sit and turn about 45 degrees to her right to see someone sitting behind her.   Baseline Yvonne Salazar cannot look over her right shoulder (beyond neutral).   Time 6   Period Months   Status New   PEDS PT  SHORT TERM GOAL #7   Title Yvonne Salazar will be able to lift toes from wheelchair when she is sitting with her knees flexed.   Baseline Needs to extend legs to actively lift toes.   Time 6   Period Months   Status New          Peds PT Long Term Goals - 10/03/14 1241    PEDS PT  LONG TERM GOAL #1   Title Yvonne Salazar will direct a caregiver (other than family) to provide daily care for her so that she could function in the community without a family member over a multiple day period.   Baseline Yvonne Salazar is not independent with directing her care, relies on parent or PT.   Time 12   Period Months   Status On-going   PEDS PT  LONG TERM GOAL #2   Title Yvonne Salazar will make fully informed decisions about medical equipment and rehab equipment that can help her maximize her function.    Baseline [name] equipment needs change as she is losing funciton.   Time 12   Period Months   Status On-going          Plan - 06/12/15 1012    Clinical Impression Statement Yvonne Salazar hikes right shoulder significantly due to tightness in her right neck musculature.  She fatigues very quickly without appropriate postural (head) support.   PT plan Continue PT every other week to increase [name] strength and stamina and promote appropriate positioning.       Problem List There are no active problems to display for this patient.   SAWULSKI,CARRIE 06/12/2015, 10:15 AM  [place], [place], [address] Phone: [phone]   Fax:  [phone]   Lawerance Bach, PT 06/12/2015 10:15 AM Phone: [phone] Fax: [phone]

## 2015-06-26 ENCOUNTER — Ambulatory Visit: Payer: 59 | Admitting: Physical Therapy

## 2015-06-26 ENCOUNTER — Encounter: Payer: Self-pay | Admitting: Physical Therapy

## 2015-06-26 DIAGNOSIS — R293 Abnormal posture: Secondary | ICD-10-CM

## 2015-06-26 DIAGNOSIS — Z7409 Other reduced mobility: Secondary | ICD-10-CM

## 2015-06-26 DIAGNOSIS — R6889 Other general symptoms and signs: Secondary | ICD-10-CM

## 2015-06-26 DIAGNOSIS — R531 Weakness: Secondary | ICD-10-CM

## 2015-06-26 NOTE — Therapy (Signed)
[place], [place], [address] Phone: [phone]   Fax:  [phone]  Pediatric Physical Therapy Treatment  Patient Details  Name: Yvonne Salazar MRN: [id number] Date of Birth: 1991-07-19 Referring Provider:  Jonathon Jordan, MD  Encounter date: 06/26/2015      End of Session - 06/26/15 1244    Visit Number 344   Number of Visits 60   Date for PT Re-Evaluation 08/23/15   Authorization Type UHC   Authorization Time Period Next recertificaiton due 1/76/16    Authorization - Visit Number 26   Authorization - Number of Visits 60   PT Start Time 0818   PT Stop Time 0900   PT Time Calculation (min) 42 min   Activity Tolerance Patient tolerated treatment well   Behavior During Therapy Willing to participate      History reviewed. No pertinent past medical history.  History reviewed. No pertinent past surgical history.  There were no vitals filed for this visit.  Visit Diagnosis:Posture abnormality  Decreased strength, endurance, and mobility                    Pediatric PT Treatment - 06/26/15 1228    Subjective Information   Patient Comments Yvonne Salazar went to [facility] since her last visit.  They recommended and are getting her a thumb splint and they have recommended custom compression socks.  The SW also gave paretns information for CAP.     PT Pediatric Exercise/Activities   Strengthening Activities Yvonne Salazar shifted weight on hips right to left X 10 each.  Push/pull UE work from w/c with arms above arm rests, X 5 10 second contractions.     Self-care Massage and mobilizations and passive UE movement performed during first 15 minutes to prepare for neck work later in session.   ROM   Knee Extension(hamstrings) Active assisted knee extensions, X 5 each leg.   Ankle DF Bilateral passive movement.   Comment Active neck for right rotation and left lateral flexion.      Pain   Pain Assessment No/denies pain                 Patient Education - 06/26/15 1238    Education Provided Yes   Education Description Yvonne Salazar interested in pursuing compression socks.  Explained that PROM benefits with UE can help neck ROM.     Person(s) Educated Patient;Mother   Method Education Verbal explanation;Demonstration;Observed session   Comprehension Verbalized understanding          Peds PT Short Term Goals - 02/20/15 1025    PEDS PT  SHORT TERM GOAL #1   Title Yvonne Salazar will be able to hold head in less than 30 degree of right lateral flexion after neck stretches for at least 10 seconds.   Baseline passively can be moved to 30 degrees, but cannot maintain   Status Not Met   PEDS PT  SHORT TERM GOAL #2   Title Yvonne Salazar will have knees extended to flexion point of 45 degrees to decrease knee contractions.   Baseline passively achieved, but with significant stretch   Status New   PEDS PT  SHORT TERM GOAL #3   Title Yvonne Salazar will sit at the edge of mat for five minutes with no support (no learning on own extremities).   Status Achieved   PEDS PT  SHORT TERM GOAL #4   Baseline Yvonne Salazar can do 5 forced consecutive exhalations.   Status  Achieved   PEDS PT  SHORT TERM GOAL #5   Title Yvonne Salazar will be able to maintain a vocal "humming" exhalation for greater than 5 seconds.   Baseline exhales for about 2 seconds   Time 6   Period Months   Status New   Additional Short Term Goals   Additional Short Term Goals Yes   PEDS PT  SHORT TERM GOAL #6   Title Yvonne Salazar will be able to sit and turn about 45 degrees to her right to see someone sitting behind her.   Baseline Yvonne Salazar cannot look over her right shoulder (beyond neutral).   Time 6   Period Months   Status New   PEDS PT  SHORT TERM GOAL #7   Title Yvonne Salazar will be able to lift toes from wheelchair when she is sitting with her knees flexed.   Baseline Needs to extend legs to actively lift toes.   Time 6   Period Months   Status New           Peds PT Long Term Goals - 10/03/14 1241    PEDS PT  LONG TERM GOAL #1   Title Yvonne Salazar will direct a caregiver (other than family) to provide daily care for her so that she could function in the community without a family member over a multiple day period.   Baseline Yvonne Salazar is not independent with directing her care, relies on parent or PT.   Time 12   Period Months   Status On-going   PEDS PT  LONG TERM GOAL #2   Title Yvonne Salazar will make fully informed decisions about medical equipment and rehab equipment that can help her maximize her function.    Baseline [name] equipment needs change as she is losing funciton.   Time 12   Period Months   Status On-going          Plan - 06/26/15 1245    Clinical Impression Statement Yvonne Salazar demonstrates right shoulder hiking and protraction due to right neck lateral flexion tightness.     PT plan Continue PT every other week to increase [name] AROM and endurance.  She will miss next appointment due to vacation.        Problem List There are no active problems to display for this patient.   SAWULSKI,CARRIE 06/26/2015, 12:48 PM  [place], [place], [address] Phone: [phone]   Fax:  [phone]   Lawerance Bach, PT 06/26/2015 12:48 PM Phone: [phone] Fax: [phone]

## 2015-07-24 ENCOUNTER — Encounter: Payer: Self-pay | Admitting: Physical Therapy

## 2015-07-24 ENCOUNTER — Ambulatory Visit: Payer: 59 | Attending: Pediatrics | Admitting: Physical Therapy

## 2015-07-24 DIAGNOSIS — R293 Abnormal posture: Secondary | ICD-10-CM | POA: Insufficient documentation

## 2015-07-24 DIAGNOSIS — M2450 Contracture, unspecified joint: Secondary | ICD-10-CM | POA: Insufficient documentation

## 2015-07-24 DIAGNOSIS — R531 Weakness: Secondary | ICD-10-CM | POA: Diagnosis present

## 2015-07-24 NOTE — Therapy (Signed)
[place], [place], [address] Phone: [phone]   Fax:  [phone]  Pediatric Physical Therapy Treatment  Patient Details  Name: Yvonne Salazar MRN: [id number] Date of Birth: [birth year] Referring Provider:  Jonathon Jordan, MD  Encounter date: 07/24/2015      End of Session - 07/24/15 1000    Visit Number 345   Number of Visits 60   Date for PT Re-Evaluation 08/23/15   Authorization Type UHC   Authorization Time Period Next recertificaiton due 1/69/67    Authorization - Visit Number 36   Authorization - Number of Visits 60   PT Start Time 0826   PT Stop Time 0901   PT Time Calculation (min) 35 min   Activity Tolerance Patient tolerated treatment well;Patient limited by fatigue   Behavior During Therapy Willing to participate      History reviewed. No pertinent past medical history.  History reviewed. No pertinent past surgical history.  There were no vitals filed for this visit.  Visit Diagnosis:Posture abnormality  Flexion contractures  Weakness generalized                    Pediatric PT Treatment - 07/24/15 0956    Subjective Information   Patient Comments Yvonne Salazar is very tired today, did not sleep well.  "Yesterday was a bad day with shooting pains in her feet".  She started a new sememster of classes, and has not had time to follow up with orthotist re: compression stockings.   PT Pediatric Exercise/Activities   Strengthening Activities Sustained exhalation exercises, X 3, for up to 4 seconds each.   Activities Performed   Core Stability Details Held arms clasped in front (not on arm rests of w/c) and patted PT's hands, X 10.   ROM   Hip Abduction and ER Active hip abduction with tactile cues X 5 each, sitting in wheelchair.   Knee Extension(hamstrings) Active assist with some hip flexion in wheelchair, both legs   Ankle DF Ankel pumps, X 10 each.   Comment Passive stretching of right SCM in w/c.   Pain   Pain Assessment No/denies pain                 Patient Education - 07/24/15 1000    Education Provided Yes   Education Description Daily hip abduction in wheelchair, bilateral, X 10 reps each or hold for 30-60 seconds.  Also encouraged mom to schedule fitting for compression stockings.   Person(s) Educated Patient;Mother   Method Education Verbal explanation;Demonstration;Observed session   Comprehension Returned demonstration          Peds PT Short Term Goals - 02/20/15 1025    PEDS PT  SHORT TERM GOAL #1   Title Yvonne Salazar will be able to hold head in less than 30 degree of right lateral flexion after neck stretches for at least 10 seconds.   Baseline passively can be moved to 30 degrees, but cannot maintain   Status Not Met   PEDS PT  SHORT TERM GOAL #2   Title Yvonne Salazar will have knees extended to flexion point of 45 degrees to decrease knee contractions.   Baseline passively achieved, but with significant stretch   Status New   PEDS PT  SHORT TERM GOAL #3   Title Yvonne Salazar will sit at the edge of mat for five minutes with no support (no learning on own extremities).   Status Achieved   PEDS PT  SHORT TERM  GOAL #4   Baseline Yvonne Salazar can do 5 forced consecutive exhalations.   Status Achieved   PEDS PT  SHORT TERM GOAL #5   Title Yvonne Salazar will be able to maintain a vocal "humming" exhalation for greater than 5 seconds.   Baseline exhales for about 2 seconds   Time 6   Period Months   Status New   Additional Short Term Goals   Additional Short Term Goals Yes   PEDS PT  SHORT TERM GOAL #6   Title Yvonne Salazar will be able to sit and turn about 45 degrees to her right to see someone sitting behind her.   Baseline Yvonne Salazar cannot look over her right shoulder (beyond neutral).   Time 6   Period Months   Status New   PEDS PT  SHORT TERM GOAL #7   Title Yvonne Salazar will be able to lift toes from wheelchair when she is sitting with her knees flexed.    Baseline Needs to extend legs to actively lift toes.   Time 6   Period Months   Status New          Peds PT Long Term Goals - 10/03/14 1241    PEDS PT  LONG TERM GOAL #1   Title Yvonne Salazar will direct a caregiver (other than family) to provide daily care for her so that she could function in the community without a family member over a multiple day period.   Baseline Yvonne Salazar is not independent with directing her care, relies on parent or PT.   Time 12   Period Months   Status On-going   PEDS PT  LONG TERM GOAL #2   Title Yvonne Salazar will make fully informed decisions about medical equipment and rehab equipment that can help her maximize her function.    Baseline [name] equipment needs change as she is losing funciton.   Time 12   Period Months   Status On-going          Plan - 07/24/15 1001    Clinical Impression Statement Yvonne Salazar demonstrates poor endurance for activities against gravity.  She did not experience pain with ROM today.   PT plan Continue PT every other week to increase [name] muscle action and postural control.        Problem List There are no active problems to display for this patient.   Tamella Tuccillo 07/24/2015, 10:06 AM  [place], [place], [address] Phone: [phone]   Fax:  [place], PT 07/24/2015 10:06 AM Phone: [phone] Fax: [phone]

## 2015-08-07 ENCOUNTER — Encounter: Payer: Self-pay | Admitting: Physical Therapy

## 2015-08-07 ENCOUNTER — Ambulatory Visit: Payer: 59 | Attending: Pediatrics | Admitting: Physical Therapy

## 2015-08-07 DIAGNOSIS — R52 Pain, unspecified: Secondary | ICD-10-CM | POA: Diagnosis present

## 2015-08-07 DIAGNOSIS — R531 Weakness: Secondary | ICD-10-CM

## 2015-08-07 DIAGNOSIS — M2450 Contracture, unspecified joint: Secondary | ICD-10-CM | POA: Insufficient documentation

## 2015-08-07 DIAGNOSIS — R293 Abnormal posture: Secondary | ICD-10-CM | POA: Insufficient documentation

## 2015-08-07 NOTE — Therapy (Signed)
[place], [place], [address] Phone: [phone]   Fax:  [phone]  Pediatric Physical Therapy Treatment  Patient Details  Name: Yvonne Salazar MRN: [id number] Date of Birth: August 19, 1991 Referring Provider:  Jonathon Jordan, MD  Encounter date: 08/07/2015      End of Session - 08/07/15 1250    Visit Number [id number]   Number of Visits 60   Date for PT Re-Evaluation 08/23/15   Authorization Type [employer]   Authorization Time Period Next recertificaiton due 6/38/75    Authorization - Visit Number 28   Authorization - Number of Visits 60   PT Start Time 0825   PT Stop Time 0904   PT Time Calculation (min) 39 min   Activity Tolerance Patient tolerated treatment well   Behavior During Therapy Willing to participate;Alert and social   Activity Tolerance Patient tolerated treatment well      History reviewed. No pertinent past medical history.  History reviewed. No pertinent past surgical history.  There were no vitals filed for this visit.  Visit Diagnosis:Posture abnormality  Flexion contractures  Weakness generalized  Pain                    Pediatric PT Treatment - 08/07/15 1244    Subjective Information   Patient Comments Yvonne Salazar scheduled the orthotist to come to next appointment to fit for compression socks.  K reports pain in right hip that may have occured when she was transferred by flexing her hip to high.     PT Pediatric Exercise/Activities   Strengthening Activities In wheelchair, UE movement arc, fflexion, abduciton, extension, AAROM on left side.     ROM   Hip Abduction and ER Hip abduction, ER and extension stretches, focus on right hip.  Moist heat was provided on right thigh to increase her tolerance of the stretch.   Knee Extension(hamstrings) Bilaterally in wheelchair.   Ankle DF  Active toe tapping bilaterally for about 2 minutes each, X 3 trials.   Comment Stretches in right hip were focus and performed about 30 minutes out of today's session, per K's request.   Pain   Pain Assessment 0-10  4-8 in right hip during stretches; moist heat alleviated                 Patient Education - 08/07/15 1249    Education Provided Yes   Education Description Active toe tapping each day for blood flow.   Person(s) Educated Patient;Mother   Method Education Verbal explanation;Demonstration;Observed session   Comprehension Returned demonstration          Peds PT Short Term Goals - 02/20/15 1025    PEDS PT  SHORT TERM GOAL #1   Title Yvonne Salazar will be able to hold head in less than 30 degree of right lateral flexion after neck stretches for at least 10 seconds.   Baseline passively can be moved to 30 degrees, but cannot maintain   Status Not Met   PEDS PT  SHORT TERM GOAL #2   Title Yvonne Salazar will have knees extended to flexion point of 45 degrees to decrease knee contractions.   Baseline passively achieved, but with significant stretch   Status New   PEDS PT  SHORT TERM GOAL #3   Title Yvonne Salazar will sit at the edge of mat for five minutes with no support (no learning on own extremities).   Status Achieved   PEDS PT  SHORT TERM GOAL #4  Baseline Yvonne Salazar can do 5 forced consecutive exhalations.   Status Achieved   PEDS PT  SHORT TERM GOAL #5   Title Yvonne Salazar will be able to maintain a vocal "humming" exhalation for greater than 5 seconds.   Baseline exhales for about 2 seconds   Time 6   Period Months   Status New   Additional Short Term Goals   Additional Short Term Goals Yes   PEDS PT  SHORT TERM GOAL #6   Title Yvonne Salazar will be able to sit and turn about 45 degrees to her right to see someone sitting behind her.   Baseline Yvonne Salazar cannot look over her right shoulder (beyond neutral).   Time 6   Period Months   Status New   PEDS PT  SHORT TERM GOAL #7   Title Yvonne Salazar will be able to lift toes from wheelchair when she is sitting with her knees flexed.    Baseline Needs to extend legs to actively lift toes.   Time 6   Period Months   Status New          Peds PT Long Term Goals - 10/03/14 1241    PEDS PT  LONG TERM GOAL #1   Title Yvonne Salazar will direct a caregiver (other than family) to provide daily care for her so that she could function in the community without a family member over a multiple day period.   Baseline Yvonne Salazar is not independent with directing her care, relies on parent or PT.   Time 12   Period Months   Status On-going   PEDS PT  LONG TERM GOAL #2   Title Yvonne Salazar will make fully informed decisions about medical equipment and rehab equipment that can help her maximize her function.    Baseline [name] equipment needs change as she is losing funciton.   Time 12   Period Months   Status On-going          Plan - 08/07/15 1251    Clinical Impression Statement Yvonne Salazar demonstrates decreased AROM and is unable to alter position considering weakness and posture.  She benefits from ther ex to promote flexibility and postural control.   PT plan Continue PT every other week to increase [name] functional ROM.      Problem List There are no active problems to display for this patient.   SAWULSKI,CARRIE 08/07/2015, 12:53 PM  [place], [place], [address] Phone: [phone]   Fax:  [place], PT 08/07/2015 12:53 PM Phone: [phone] Fax: [phone]

## 2015-08-21 ENCOUNTER — Ambulatory Visit: Payer: 59 | Admitting: Physical Therapy

## 2015-08-21 ENCOUNTER — Encounter: Payer: Self-pay | Admitting: Physical Therapy

## 2015-08-21 DIAGNOSIS — R52 Pain, unspecified: Secondary | ICD-10-CM

## 2015-08-21 DIAGNOSIS — R293 Abnormal posture: Secondary | ICD-10-CM

## 2015-08-21 DIAGNOSIS — R531 Weakness: Secondary | ICD-10-CM

## 2015-08-21 DIAGNOSIS — M2450 Contracture, unspecified joint: Secondary | ICD-10-CM

## 2015-08-21 NOTE — Therapy (Signed)
[place], [place], [address] Phone: [phone]   Fax:  [phone]  Pediatric Physical Therapy Treatment  Patient Details  Name: Yvonne Salazar MRN: [id number] Date of Birth: October 17, 1991 Referring Provider:  Jonathon Jordan, MD  Encounter date: 08/21/2015      End of Session - 08/21/15 1054    Visit Number 347   Number of Visits 60   Date for PT Re-Evaluation 02/18/16   Authorization Type U[employer]   Authorization Time Period Next recertificaiton due 6/78/93    Authorization - Visit Number 29   Authorization - Number of Visits 60   PT Start Time 8101   PT Stop Time 0904   PT Time Calculation (min) 40 min   Activity Tolerance Patient tolerated treatment well   Behavior During Therapy Willing to participate;Alert and social      History reviewed. No pertinent past medical history.  History reviewed. No pertinent past surgical history.  There were no vitals filed for this visit.  Visit Diagnosis:Posture abnormality  Flexion contractures  Weakness generalized  Pain                    Pediatric PT Treatment - 08/21/15 1047    Subjective Information   Patient Comments Yvonne Salazar reports that she has some shooting pain in her left leg that happens about 2-3 times a month.  She also reports noting increased tightness in her right hip and right shoulder.     PT Pediatric Exercise/Activities   Self-care Willette Alma from [facility] a [place] [facility], present to measure K for lower LE compression stockings.  PT assisted with meausurements, took less than 10 minutes, and incorportated AAROM of LE joints during this activity.     Activities Performed   Core Stability Details Rotated trunk from wheelchair both directions, with focus on rotating toward left side (as Yvonne Salazar tends to wind sweep her hips to the right).   ROM   Hip Abduction and ER Active and passive hip abduction to neutral  from wheelchair, focus on left hip, performed 10 reps, and then sustained for 60 seconds, 3 trials   Knee Extension(hamstrings) Active assistive knee extension, on left she can move from 90 degrees of flexion to 60, and on left, she can move from 90 to 55 degrees of flexion.   Ankle DF Acitve toe tapping with feet/hips in neutral.   Comment Massaged left thigh musculature during rest breaks.   Pain   Pain Assessment 0-10  4-6 in LE's, shooting pain with movement and increased ext                 Patient Education - 08/21/15 1053    Education Provided Yes   Education Description Instructed Yvonne Salazar that she must passively stretch her right hip out of adduction (she uses a tissue box between her knees), and this must be done daily for at least 1 hour.   Person(s) Educated Patient;Mother   Method Education Verbal explanation;Demonstration;Observed session   Comprehension Verbalized understanding          Peds PT Short Term Goals - 08/21/15 1102    PEDS PT  SHORT TERM GOAL #1   Title Yvonne Salazar will be able to hold her hips in a neutral position for at least 30 minutes when in wheelchair.   Baseline Yvonne Salazar tends to windsweep her hips and move her left foot to her right foot rest within 5 minutes of being prompted due to  tightness in her right hip.   Time 6   Period Months   Status New   PEDS PT  SHORT TERM GOAL #2   Title Yvonne Salazar will have knees extended to flexion point of 45 degrees to decrease knee contractions.   Baseline Yvonne Salazar cannot be extended beyond 30-35 degrees out of 90 degrees of flexion at her knees (right tighter than left).   Status Not Met   PEDS PT  SHORT TERM GOAL #3   Title Yvonne Salazar will be able to work her right arm against gravity for five minutes without propping it on her wheelchair arm rest.   Baseline Yvonne Salazar needs to rest her right arm after 1-2 minutes typically.  Her right arm is dominant, and she uses it frequently for her work.   Time 6   Period Months   Status New    PEDS PT  SHORT TERM GOAL #4   Title Yvonne Salazar will tolerate wearing new compression stockings for 4 hours a day.   Baseline Fit today for compression stockings.  She has never worn them before.     Time 6   Period Months   Status New   PEDS PT  SHORT TERM GOAL #5   Title Yvonne Salazar will be able to maintain a vocal "humming" exhalation for greater than 5 seconds.   Status Achieved   PEDS PT  SHORT TERM GOAL #6   Title Yvonne Salazar will be able to sit and turn about 45 degrees to her right to see someone sitting behind her.   Status Achieved   PEDS PT  SHORT TERM GOAL #7   Title Yvonne Salazar will be able to lift toes from wheelchair when she is sitting with her knees flexed.   Status Achieved          Peds PT Long Term Goals - 08/21/15 1106    PEDS PT  LONG TERM GOAL #1   Title Yvonne Salazar will direct a caregiver (other than family) to provide daily care for her so that she could function in the community without a family member over a multiple day period.   Baseline Yvonne Salazar's mother has been able to travel away from [place] for more than one day now that her sister lives in town.   Status Achieved   PEDS PT  LONG TERM GOAL #2   Title Yvonne Salazar will make fully informed decisions about medical equipment and rehab equipment that can help her maximize her function.    Status On-going          Plan - 08/21/15 1055    Clinical Impression Statement Yvonne Salazar's posture grows increasingly asymmetric due to her lack of mobility and AROM.  Her right hip is adducted and flexed strongly, and she rarely moves out of the positioning of her wheelchair.  She has weakness, stiffness and some pain and benefits from therapeutic activities to move out of this resting posture and promote ncreased comfort and endurance.  Yvonne Salazar sits with hips and knees in a 90-90 flexion and she has her hips adducted , especially on the right side.  She lacks extension of knees and is flexed 90 degrees typically, but 60-55 degrees away from neutral.     Patient will  benefit from treatment of the following deficits: Decreased ability to maintain good postural alignment;Decreased ability to participate in recreational activities;Decreased function at home and in the community   Rehab Potential Excellent   Clinical impairments affecting rehab potential N/A   PT Frequency Every other week  PT Duration 6 months   PT Treatment/Intervention Therapeutic activities;Therapeutic exercises;Neuromuscular reeducation;Wheelchair management;Manual techniques;Orthotic fitting and training;Instruction proper posture/body mechanics;Self-care and home management   PT plan Continue PT every other week to increase Yvonne Salazar's functional range of motion, strength, endurance, postural support and comfort.      Problem List There are no active problems to display for this patient.   SAWULSKI,CARRIE 08/21/2015, 11:09 AM  [place], [place], [address] Phone: [phone]   Fax:  [place], PT 08/21/2015 11:09 AM Phone: [phone] Fax: [phone]

## 2015-09-04 ENCOUNTER — Encounter: Payer: Self-pay | Admitting: Physical Therapy

## 2015-09-04 ENCOUNTER — Ambulatory Visit: Payer: 59 | Attending: Pediatrics | Admitting: Physical Therapy

## 2015-09-04 DIAGNOSIS — M2450 Contracture, unspecified joint: Secondary | ICD-10-CM | POA: Diagnosis present

## 2015-09-04 DIAGNOSIS — R52 Pain, unspecified: Secondary | ICD-10-CM | POA: Diagnosis present

## 2015-09-04 DIAGNOSIS — R293 Abnormal posture: Secondary | ICD-10-CM | POA: Insufficient documentation

## 2015-09-04 DIAGNOSIS — R531 Weakness: Secondary | ICD-10-CM | POA: Diagnosis present

## 2015-09-04 NOTE — Therapy (Signed)
[place], [place], [address] Phone: [phone]   Fax:  [phone]  Pediatric Physical Therapy Treatment  Patient Details  Name: Yvonne Salazar MRN: [id number] Date of Birth: 05-Sep-1991 Referring Provider:  Jonathon Jordan, MD  Encounter date: 09/04/2015      End of Session - 09/04/15 0910    Visit Number 189   Number of Visits 60   Date for PT Re-Evaluation 02/18/16   Authorization Type UHC   Authorization Time Period Next recertificaiton due 8/42/10    Authorization - Visit Number 30   Authorization - Number of Visits 60   PT Start Time 0823   PT Stop Time 0903   PT Time Calculation (min) 40 min   Activity Tolerance Patient tolerated treatment well   Behavior During Therapy Willing to participate;Alert and social      History reviewed. No pertinent past medical history.  History reviewed. No pertinent past surgical history.  There were no vitals filed for this visit.  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  She is complaining of more discomfort in right hip (and left hip has previously been diagnosed with subluxation).   PT Pediatric Exercise/Activities   Strengthening Activities In w/c, K worked on UE ther ex, including: AAROM for full UE shoulder ROM; punching bag, 30 seconds, X 5 reps: alternate lifting, X 15, needed assistance on left; and isometric chest press by pushing hands together, X 30 seconds, X 3 reps.    Activities Performed   Core Stability Details Asked K to lean forward and lift head off of head rest, X 10 reps   ROM   Hip Abduction and ER Stretched hips into abduction and provided distraction at both hips  (alternately); worked on sustained stretches for 8-10 minutes.   Pain   Pain Assessment FLACC  6/10 right hip during PROM into abduction, end-range                 Patient Education - 09/04/15 0909    Education Provided Yes   Education Description Continue to work on stretching hips into abduction, and encouraged some AROM of UE ther ex a few minutes (about 5) each day.   Person(s) Educated Patient;Mother   Method Education Verbal explanation;Demonstration;Observed session   Comprehension Verbalized understanding          Peds PT Short Term Goals - 08/21/15 1102    PEDS PT  SHORT TERM GOAL #1   Title Yvonne Salazar will be able to hold her hips in a neutral position for at least 30 minutes when in wheelchair.   Baseline Yvonne Salazar tends to windsweep her hips and move her left foot to her right foot rest within 5 minutes of being prompted due to tightness in her right hip.   Time 6   Period Months   Status New   PEDS PT  SHORT TERM GOAL #2   Title Yvonne Salazar will have knees extended to flexion point of 45 degrees to decrease knee contractions.   Baseline Yvonne Salazar cannot be extended beyond 30-35 degrees out of 90 degrees of flexion at her knees (right tighter than left).   Status Not Met  PEDS PT  SHORT TERM GOAL #3   Title Yvonne Salazar will be able to work her right arm against gravity for five minutes without propping it on her wheelchair arm rest.   Baseline Yvonne Salazar needs to rest her right arm after 1-2 minutes typically.  Her right arm is dominant, and she uses it frequently for her work.   Time 6   Period Months   Status New   PEDS PT  SHORT TERM GOAL #4   Title Yvonne Salazar will tolerate wearing new compression stockings for 4 hours a day.   Baseline Fit today for compression stockings.  She has never worn them before.     Time 6   Period Months   Status New   PEDS PT  SHORT TERM GOAL #5   Title Yvonne Salazar will be able to maintain a vocal "humming" exhalation for greater than 5 seconds.   Status Achieved    PEDS PT  SHORT TERM GOAL #6   Title Yvonne Salazar will be able to sit and turn about 45 degrees to her right to see someone sitting behind her.   Status Achieved   PEDS PT  SHORT TERM GOAL #7   Title Yvonne Salazar will be able to lift toes from wheelchair when she is sitting with her knees flexed.   Status Achieved          Peds PT Long Term Goals - 08/21/15 1106    PEDS PT  LONG TERM GOAL #1   Title Yvonne Salazar will direct a caregiver (other than family) to provide daily care for her so that she could function in the community without a family member over a multiple day period.   Baseline Yvonne Salazar's mother has been able to travel away from [place] for more than one day now that her sister lives in town.   Status Achieved   PEDS PT  LONG TERM GOAL #2   Title Yvonne Salazar will make fully informed decisions about medical equipment and rehab equipment that can help her maximize her function.    Status On-going          Plan - 09/04/15 0911    Clinical Impression Statement Yvonne Salazar fatigues with anti-gravity movement, and demonstrates more weakness in left uE compared to right.  She has increasingly asymmetric lower body posture, and lacks any weight bearing ability.  She experiences pain, discomfort secondary to posture and lack of mobility.   PT plan Continue PT every other week to increase Yvonne Salazar's strength and ROM.      Problem List There are no active problems to display for this patient.   Orlin Kann 09/04/2015, 9:13 AM  [place], [place], [address] Phone: [phone]   Fax:  [place], PT 09/04/2015 9:13 AM Phone: [phone] Fax: [phone]

## 2015-09-18 ENCOUNTER — Encounter: Payer: Self-pay | Admitting: Physical Therapy

## 2015-09-18 ENCOUNTER — Ambulatory Visit: Payer: 59 | Admitting: Physical Therapy

## 2015-09-18 DIAGNOSIS — R52 Pain, unspecified: Secondary | ICD-10-CM

## 2015-09-18 DIAGNOSIS — R293 Abnormal posture: Secondary | ICD-10-CM

## 2015-09-18 DIAGNOSIS — M2450 Contracture, unspecified joint: Secondary | ICD-10-CM

## 2015-09-18 DIAGNOSIS — R531 Weakness: Secondary | ICD-10-CM

## 2015-09-18 NOTE — Therapy (Signed)
[place], [place], [address] Phone: [phone]   Fax:  [phone]  Pediatric Physical Therapy Treatment  Patient Details  Name: Yvonne Salazar MRN: [id number] Date of Birth: 1991-05-29 No Data Recorded  Encounter date: 09/18/2015      End of Session - 09/18/15 1219    Visit Number 349   Number of Visits 60   Date for PT Re-Evaluation 02/18/16   Authorization Type UHC   Authorization Time Period Next recertificaiton due 8/36/62    Authorization - Visit Number 31   Authorization - Number of Visits 60   PT Start Time 0820   PT Stop Time 0900   PT Time Calculation (min) 40 min   Activity Tolerance Patient tolerated treatment well   Behavior During Therapy Willing to participate;Alert and social      History reviewed. No pertinent past medical history.  History reviewed. No pertinent past surgical history.  There were no vitals filed for this visit.  Visit Diagnosis:Posture abnormality  Weakness generalized  Pain  Flexion contractures                    Pediatric PT Treatment - 09/18/15 1213    Subjective Information   Patient Comments Yvonne Salazar reports that her right hip continuees to be "stiff" and to pop when she is transferred.  Yvonne Salazar had not followed up iwth wheelchair rep from [employer] to address seating concerns.     PT Pediatric Exercise/Activities   Strengthening Activities Actively kicked LE's into extension when sitting in wheelchair with maximal asssitance, sustained stretch X 10-15 sec each, 5 reps each LE   Self-care Willette Alma of [facility] a [place] [facility], present and fit K with compression stockings.  PT and Merry Proud discussed wear and care with K and mom.   Activities Performed   Core Stability Details Rotated trunk both right and left to end-ranges, using arms to pull on arm rests, about 2 minutes each.   ROM   Hip Abduction and ER AAROM hip abduction  in chair, X 10 each LE, 2 sets   Ankle DF Ankle pumps after compression stockings donned.   Pain   Pain Assessment FLACC  2/10 with right hip flexion in chair when donning socks                 Patient Education - 09/18/15 1218    Education Provided Yes   Education Description Asked Yvonne Salazar to please contact Deberah Pelton about wheelchair cushion options to improve seating alignment.  Discussed wearing compression stockings all day (when Yvonne Salazar's LE's are in a dependent position).   Person(s) Educated Patient;Mother   Method Education Verbal explanation;Demonstration;Observed session   Comprehension Verbalized understanding          Peds PT Short Term Goals - 08/21/15 1102    PEDS PT  SHORT TERM GOAL #1   Title Yvonne Salazar will be able to hold her hips in a neutral position for at least 30 minutes when in wheelchair.   Baseline Yvonne Salazar tends to windsweep her hips and move her left foot to her right foot rest within 5 minutes of being prompted due to tightness in her right hip.   Time 6   Period Months   Status New   PEDS PT  SHORT TERM GOAL #2   Title Yvonne Salazar will have knees extended to flexion point of 45 degrees to decrease knee contractions.   Baseline Yvonne Salazar cannot be extended beyond 30-35  degrees out of 90 degrees of flexion at her knees (right tighter than left).   Status Not Met   PEDS PT  SHORT TERM GOAL #3   Title Yvonne Salazar will be able to work her right arm against gravity for five minutes without propping it on her wheelchair arm rest.   Baseline Yvonne Salazar needs to rest her right arm after 1-2 minutes typically.  Her right arm is dominant, and she uses it frequently for her work.   Time 6   Period Months   Status New   PEDS PT  SHORT TERM GOAL #4   Title Yvonne Salazar will tolerate wearing new compression stockings for 4 hours a day.   Baseline Fit today for compression stockings.  She has never worn them before.     Time 6   Period Months   Status New   PEDS PT  SHORT TERM GOAL #5   Title  Yvonne Salazar will be able to maintain a vocal "humming" exhalation for greater than 5 seconds.   Status Achieved   PEDS PT  SHORT TERM GOAL #6   Title Yvonne Salazar will be able to sit and turn about 45 degrees to her right to see someone sitting behind her.   Status Achieved   PEDS PT  SHORT TERM GOAL #7   Title Yvonne Salazar will be able to lift toes from wheelchair when she is sitting with her knees flexed.   Status Achieved          Peds PT Long Term Goals - 08/21/15 1106    PEDS PT  LONG TERM GOAL #1   Title Yvonne Salazar will direct a caregiver (other than family) to provide daily care for her so that she could function in the community without a family member over a multiple day period.   Baseline Yvonne Salazar's mother has been able to travel away from [place] for more than one day now that her sister lives in town.   Status Achieved   PEDS PT  LONG TERM GOAL #2   Title Yvonne Salazar will make fully informed decisions about medical equipment and rehab equipment that can help her maximize her function.    Status On-going          Plan - 09/18/15 1220    Clinical Impression Statement Yvonne Salazar's feet are very sensitive and she has limited LE ROM, so donning and doffing compression stockings could be challenging, but may help with hypersthesias in distal LE's.     PT plan Continue PT every other week to optimize Yvonne Salazar's alignment and comfort.      Problem List There are no active problems to display for this patient.   SAWULSKI,CARRIE 09/18/2015, 12:22 PM  [place], [place], [address] Phone: [phone]   Fax:  [phone]  Name: MORGANE JOERGER MRN: [id number] Date of Birth: 12/15/1990 Lawerance Bach, PT 09/18/2015 12:22 PM Phone: [phone] Fax: [phone]

## 2015-10-02 ENCOUNTER — Ambulatory Visit: Payer: 59 | Attending: Pediatrics | Admitting: Physical Therapy

## 2015-10-02 DIAGNOSIS — M2450 Contracture, unspecified joint: Secondary | ICD-10-CM | POA: Diagnosis present

## 2015-10-02 DIAGNOSIS — R293 Abnormal posture: Secondary | ICD-10-CM

## 2015-10-02 DIAGNOSIS — R531 Weakness: Secondary | ICD-10-CM | POA: Diagnosis present

## 2015-10-02 DIAGNOSIS — R52 Pain, unspecified: Secondary | ICD-10-CM | POA: Diagnosis present

## 2015-10-02 NOTE — Therapy (Signed)
[place], [place], [address] Phone: [phone]   Fax:  [phone]  Pediatric Physical Therapy Treatment  Patient Details  Name: Yvonne Salazar MRN: [id number] Date of Birth: 07-20-1991 No Data Recorded  Encounter date: 10/02/2015      End of Session - 10/02/15 0912    Visit Number 350   Number of Visits 60   Date for PT Re-Evaluation 02/18/16   Authorization Type [employer]   Authorization Time Period Next recertificaiton due 9/38/18    Authorization - Visit Number 32   Authorization - Number of Visits 60   PT Start Time 0823   PT Stop Time 0903   PT Time Calculation (min) 40 min   Activity Tolerance Patient tolerated treatment well   Behavior During Therapy Willing to participate;Alert and social   Activity Tolerance Patient tolerated treatment well      No past medical history on file.  No past surgical history on file.  There were no vitals filed for this visit.  Visit Diagnosis:Posture abnormality  Weakness generalized  Pain                    Pediatric PT Treatment - 10/02/15 0905    Subjective Information   Patient Comments Yvonne Salazar has not had "time" to commit to wearing her compression stockings.  She had a visit with Dr. Alveta Heimlich, who reports Yvonne Salazar is doing well, but he wants her to gain weight.  She got a flu shot at that visit.  she continues to have pain in her right hip that radiates.  She has not heard back from [employer] about considering more supportive seating options.     PT Pediatric Exercise/Activities   Strengthening Activities Upper extremity AAROM, retraction/protraction X 20 reps, X 2 sets; diaganol flexion/extension X 10 each; horizontal abduction and adduction X 10 reps, 2 sets each.     Self-care Moved feet on foot rests to avoid increasing    ROM   Hip Abduction and ER Rotated rightt hip out of IR and tried to abduct to neutral with deep tissue  muassage.  Massaged right thigh musculature while offering distraction in hip on left side.     Pain   Pain Assessment 0-10  3-6 in right hip.  Increased with ROM, improved with massage                 Patient Education - 10/02/15 0911    Education Provided Yes   Education Description "chicken wing" exercise, X 10 reps each day; hands clasped and abduct at shoulder   Person(s) Educated Patient;Mother   Method Education Verbal explanation;Demonstration;Observed session   Comprehension Returned demonstration          Peds PT Short Term Goals - 08/21/15 1102    PEDS PT  SHORT TERM GOAL #1   Title Yvonne Salazar will be able to hold her hips in a neutral position for at least 30 minutes when in wheelchair.   Baseline Yvonne Salazar tends to windsweep her hips and move her left foot to her right foot rest within 5 minutes of being prompted due to tightness in her right hip.   Time 6   Period Months   Status New   PEDS PT  SHORT TERM GOAL #2   Title Yvonne Salazar will have knees extended to flexion point of 45 degrees to decrease knee contractions.   Baseline Yvonne Salazar cannot be extended beyond 30-35 degrees out of 90 degrees of  flexion at her knees (right tighter than left).   Status Not Met   PEDS PT  SHORT TERM GOAL #3   Title Yvonne Salazar will be able to work her right arm against gravity for five minutes without propping it on her wheelchair arm rest.   Baseline Yvonne Salazar needs to rest her right arm after 1-2 minutes typically.  Her right arm is dominant, and she uses it frequently for her work.   Time 6   Period Months   Status New   PEDS PT  SHORT TERM GOAL #4   Title Yvonne Salazar will tolerate wearing new compression stockings for 4 hours a day.   Baseline Fit today for compression stockings.  She has never worn them before.     Time 6   Period Months   Status New   PEDS PT  SHORT TERM GOAL #5   Title Yvonne Salazar will be able to maintain a vocal "humming" exhalation for greater than 5 seconds.   Status Achieved   PEDS PT   SHORT TERM GOAL #6   Title Yvonne Salazar will be able to sit and turn about 45 degrees to her right to see someone sitting behind her.   Status Achieved   PEDS PT  SHORT TERM GOAL #7   Title Yvonne Salazar will be able to lift toes from wheelchair when she is sitting with her knees flexed.   Status Achieved          Peds PT Long Term Goals - 08/21/15 1106    PEDS PT  LONG TERM GOAL #1   Title Yvonne Salazar will direct a caregiver (other than family) to provide daily care for her so that she could function in the community without a family member over a multiple day period.   Baseline Yvonne Salazar's mother has been able to travel away from [place] for more than one day now that her sister lives in town.   Status Achieved   PEDS PT  LONG TERM GOAL #2   Title Yvonne Salazar will make fully informed decisions about medical equipment and rehab equipment that can help her maximize her function.    Status On-going          Plan - 10/02/15 0912    Clinical Impression Statement Yvonne Salazar's lower extremity posture is asymmetric and range of motion is limited, particularly in right hip which rests in internal rotation, flexion and adduction.  She fatigues rapidly with movement against gravity for UE's, neck and trunk.  She benefits from appropriate postiioning support.     PT plan Continue PT every other week to promote improved comfort and quality of life.      Problem List There are no active problems to display for this patient.   Elam Ellis 10/02/2015, 9:14 AM  [place], [place], [address] Phone: [phone]   Fax:  [phone]  Name: NYEEMA WANT MRN: [id number] Date of Birth: 1991-08-25  Lawerance Bach, PT 10/02/2015 9:14 AM Phone: [phone] Fax: [phone]

## 2015-10-16 ENCOUNTER — Ambulatory Visit: Payer: 59 | Admitting: Physical Therapy

## 2015-10-16 ENCOUNTER — Encounter: Payer: Self-pay | Admitting: Physical Therapy

## 2015-10-16 DIAGNOSIS — R531 Weakness: Secondary | ICD-10-CM

## 2015-10-16 DIAGNOSIS — R293 Abnormal posture: Secondary | ICD-10-CM

## 2015-10-16 DIAGNOSIS — R52 Pain, unspecified: Secondary | ICD-10-CM

## 2015-10-16 DIAGNOSIS — M2450 Contracture, unspecified joint: Secondary | ICD-10-CM

## 2015-10-16 NOTE — Therapy (Signed)
[name], [place], [address] Phone: [phone]   Fax:  [phone]  Pediatric Physical Therapy Treatment  Patient Details  Name: Yvonne Salazar MRN: [id number] Date of Birth: [birth year] No Data Recorded  Encounter date: 10/16/2015      End of Session - 10/16/15 1225    Visit Number 351   Number of Visits 60   Date for PT Re-Evaluation 02/18/16   Authorization Type U[employer]   Authorization Time Period Next recertificaiton due 05/05/99    Authorization - Visit Number 33   Authorization - Number of Visits 60   PT Start Time 4599   PT Stop Time 0904   PT Time Calculation (min) 40 min   Activity Tolerance Patient tolerated treatment well   Behavior During Therapy Willing to participate;Alert and social      History reviewed. No pertinent past medical history.  History reviewed. No pertinent past surgical history.  There were no vitals filed for this visit.  Visit Diagnosis:Posture abnormality  Weakness generalized  Pain  Flexion contractures                    Pediatric PT Treatment - 10/16/15 1218    Subjective Information   Patient Comments Yvonne Salazar continues to have pain in right hip, and is eager to have her seating system modified.  She wants time to "think about" a custom seat option.   PT Pediatric Exercise/Activities   Strengthening Activities K sat without head rest in wheelchair during head rest adjustments (about 10 minutes)   Self-care Deberah Pelton of [employer] present and adjusted seat depth so that K has more support under femurs.  He also adjusted joy stick so K can reach it and the height of head rest for improved support.  Erlene Quan talked iwth Yvonne Salazar and mom about benefits of custom seat that could have pommel built in and would allow adjustment for pelvic obliquity.     Activities Performed   Core Stability Details Sat edge of mat X 20 minutes today with  intermittent support to lean or rest UE's.  Her LE's were propped on small bench for weight bearing, and hips were slightly lower than knees in this position.   Gross Motor Activities   Comment Total assist lift transfer, out of chair by PT and back into w/c by mom.     ROM   Hip Abduction and ER Stretched hips into abduction.     Comment Stretched bilateral elbows into extension, and performed resisted elbow flexion/extension, X 10 each.     Pain   Pain Assessment FLACC  5/10 abrupt rt hip pain with transfer; immediate relief                  Patient Education - 10/16/15 1224    Education Provided Yes   Education Description PT reiterated benefits of custom seating to [facility] and mom with goal of increasing Yvonne Salazar's comfort   Person(s) Educated Patient;Mother   Method Education Verbal explanation;Observed session   Comprehension Verbalized understanding          Peds PT Short Term Goals - 08/21/15 1102    PEDS PT  SHORT TERM GOAL #1   Title Yvonne Salazar be able to hold her hips in a neutral position for at least 30 minutes when in wheelchair.   Baseline Yvonne Salazar tends to windsweep her hips and move her left foot to her right foot rest within 5 minutes of being  prompted due to tightness in her right hip.   Time 6   Period Months   Status New   PEDS PT  SHORT TERM GOAL #2   Title Yvonne Salazar have knees extended to flexion point of 45 degrees to decrease knee contractions.   Baseline Yvonne Salazar cannot be extended beyond 30-35 degrees out of 90 degrees of flexion at her knees (right tighter than left).   Status Not Met   PEDS PT  SHORT TERM GOAL #3   Title Yvonne Salazar be able to work her right arm against gravity for five minutes without propping it on her wheelchair arm rest.   Baseline Yvonne Salazar needs to rest her right arm after 1-2 minutes typically.  Her right arm is dominant, and she uses it frequently for her work.   Time 6   Period Months   Status New   PEDS PT  SHORT TERM GOAL #4   Title  Yvonne Salazar tolerate wearing new compression stockings for 4 hours a day.   Baseline Fit today for compression stockings.  She has never worn them before.     Time 6   Period Months   Status New   PEDS PT  SHORT TERM GOAL #5   Title Yvonne Salazar be able to maintain a vocal "humming" exhalation for greater than 5 seconds.   Status Achieved   PEDS PT  SHORT TERM GOAL #6   Title Yvonne Salazar be able to sit and turn about 45 degrees to her right to see someone sitting behind her.   Status Achieved   PEDS PT  SHORT TERM GOAL #7   Title Yvonne Salazar be able to lift toes from wheelchair when she is sitting with her knees flexed.   Status Achieved          Peds PT Long Term Goals - 08/21/15 1106    PEDS PT  LONG TERM GOAL #1   Title Yvonne Salazar direct a caregiver (other than family) to provide daily care for her so that she could function in the community without a family member over a multiple day period.   Baseline Yvonne Salazar's mother has been able to travel away from [place] for more than one day now that her sister lives in town.   Status Achieved   PEDS PT  LONG TERM GOAL #2   Title Yvonne Salazar make fully informed decisions about medical equipment and rehab equipment that can help her maximize her function.    Status On-going          Plan - 10/16/15 1232    Clinical Impression Statement Yvonne Salazar would benefit from improved postural support considering her asymmetry, tightness and pelvic obliquity.  She demonstrates improved comfort and support with seat depth adjusted, and this may improve her posture and decrease pain.   PT plan Continue PT every other week to increase Yvonne Salazar's postural support and control.      Problem List There are no active problems to display for this patient.   Yvonne Salazar 10/16/2015, 12:34 PM  [place], [place], [address] Phone: [phone]   Fax:  [phone]  Name: Yvonne Salazar MRN: [id number] Date of Birth: [birth year]  Lawerance Bach, PT 10/16/2015 12:34 PM Phone: [phone] Fax: [phone]

## 2015-10-30 ENCOUNTER — Encounter: Payer: Self-pay | Admitting: Physical Therapy

## 2015-10-30 ENCOUNTER — Ambulatory Visit: Payer: 59 | Attending: Pediatrics | Admitting: Physical Therapy

## 2015-10-30 DIAGNOSIS — M2450 Contracture, unspecified joint: Secondary | ICD-10-CM | POA: Insufficient documentation

## 2015-10-30 DIAGNOSIS — R531 Weakness: Secondary | ICD-10-CM | POA: Diagnosis present

## 2015-10-30 DIAGNOSIS — R293 Abnormal posture: Secondary | ICD-10-CM | POA: Insufficient documentation

## 2015-10-30 NOTE — Therapy (Signed)
[place], [place], [address] Phone: [phone]   Fax:  [phone]  Pediatric Physical Therapy Treatment  Patient Details  Name: Yvonne Salazar MRN: [id number] Date of Birth: Feb 24, 1991 No Data Recorded  Encounter date: 10/30/2015      End of Session - 10/30/15 1244    Visit Number 352   Number of Visits 60   Date for PT Re-Evaluation 02/18/16   Authorization Type U[employer]   Authorization Time Period Next recertificaiton due 11/29/73    Authorization - Visit Number 34   Authorization - Number of Visits 60   PT Start Time 0819   PT Stop Time 0904   PT Time Calculation (min) 45 min   Activity Tolerance Patient tolerated treatment well   Behavior During Therapy Willing to participate;Alert and social      History reviewed. No pertinent past medical history.  History reviewed. No pertinent past surgical history.  There were no vitals filed for this visit.  Visit Diagnosis:Posture abnormality  Weakness generalized  Flexion contractures                    Pediatric PT Treatment - 10/30/15 1236    Subjective Information   Patient Comments Yvonne Salazar plans to call Yvonne Salazar from [facility] about consulting for the custom seating options.  She has some new challenges since he made adjustments including  stretch in hamstrings and increased lordosis/less support on her back.     PT Pediatric Exercise/Activities   Strengthening Activities Seated UE, hands clasped, rotation and push/pull X 20 each.     Self-care Deep tissue massage along triceps, scalenes and rhomboids, with focus on left, per Yvonne Salazar's request today.   Activities Performed   Core Stability Details Worked on experiencing sitting at different angles to see if back could be more supported (reclined recommended by PT).   ROM   Hip Abduction and ER Stretched from wheelchair, focus on right LE.   Knee Extension(hamstrings)  Strethed passively in chair.   Ankle DF Active taps.   Comment Stretched right SCM.   Pain   Pain Assessment No/denies pain                 Patient Education - 10/30/15 1244    Education Provided Yes   Education Description Encouraged Yvonne Salazar to call Yvonne Salazar about custom seating option.   Person(s) Educated Patient;Mother   Method Education Verbal explanation;Observed session   Comprehension Verbalized understanding          Peds PT Short Term Goals - 08/21/15 1102    PEDS PT  SHORT TERM GOAL #1   Title Yvonne Salazar will be able to hold her hips in a neutral position for at least 30 minutes when in wheelchair.   Baseline Yvonne Salazar tends to windsweep her hips and move her left foot to her right foot rest within 5 minutes of being prompted due to tightness in her right hip.   Time 6   Period Months   Status New   PEDS PT  SHORT TERM GOAL #2   Title Yvonne Salazar will have knees extended to flexion point of 45 degrees to decrease knee contractions.   Baseline Yvonne Salazar cannot be extended beyond 30-35 degrees out of 90 degrees of flexion at her knees (right tighter than left).   Status Not Met   PEDS PT  SHORT TERM GOAL #3   Title Yvonne Salazar will be able to work her right arm against gravity for  five minutes without propping it on her wheelchair arm rest.   Baseline Yvonne Salazar needs to rest her right arm after 1-2 minutes typically.  Her right arm is dominant, and she uses it frequently for her work.   Time 6   Period Months   Status New   PEDS PT  SHORT TERM GOAL #4   Title Yvonne Salazar will tolerate wearing new compression stockings for 4 hours a day.   Baseline Fit today for compression stockings.  She has never worn them before.     Time 6   Period Months   Status New   PEDS PT  SHORT TERM GOAL #5   Title Yvonne Salazar will be able to maintain a vocal "humming" exhalation for greater than 5 seconds.   Status Achieved   PEDS PT  SHORT TERM GOAL #6   Title Yvonne Salazar will be able to sit and turn about 45 degrees to her right to  see someone sitting behind her.   Status Achieved   PEDS PT  SHORT TERM GOAL #7   Title Yvonne Salazar will be able to lift toes from wheelchair when she is sitting with her knees flexed.   Status Achieved          Peds PT Long Term Goals - 08/21/15 1106    PEDS PT  LONG TERM GOAL #1   Title Yvonne Salazar will direct a caregiver (other than family) to provide daily care for her so that she could function in the community without a family member over a multiple day period.   Baseline Yvonne Salazar's mother has been able to travel away from Yvonne Salazar for more than one day now that her sister lives in town.   Status Achieved   PEDS PT  LONG TERM GOAL #2   Title Yvonne Salazar will make fully informed decisions about medical equipment and rehab equipment that can help her maximize her function.    Status On-going          Plan - 10/30/15 1245    Clinical Impression Statement Yvonne Salazar would benefit from improved postural support  She has fatigue in shoulders and low back.     PT plan Continue PT every other week to improve Yvonne Salazar's comfort and posture.        Problem List There are no active problems to display for this patient.   Yvonne Salazar 10/30/2015, 12:49 PM  [place], [place], [address] Phone: [phone]   Fax:  [phone]  Name: Yvonne Salazar MRN: [id number] Date of Birth: 10-Mar-1991   Yvonne Salazar, PT 10/30/2015 12:49 PM Phone: [phone] Fax: [phone]

## 2015-11-13 ENCOUNTER — Ambulatory Visit: Payer: 59 | Admitting: Physical Therapy

## 2015-11-27 ENCOUNTER — Ambulatory Visit: Payer: 59 | Admitting: Physical Therapy

## 2015-12-04 ENCOUNTER — Encounter: Payer: Self-pay | Admitting: Physical Therapy

## 2015-12-04 ENCOUNTER — Ambulatory Visit: Payer: 59 | Attending: Pediatrics | Admitting: Physical Therapy

## 2015-12-04 DIAGNOSIS — M2450 Contracture, unspecified joint: Secondary | ICD-10-CM | POA: Diagnosis present

## 2015-12-04 DIAGNOSIS — R52 Pain, unspecified: Secondary | ICD-10-CM | POA: Insufficient documentation

## 2015-12-04 DIAGNOSIS — R531 Weakness: Secondary | ICD-10-CM

## 2015-12-04 DIAGNOSIS — R293 Abnormal posture: Secondary | ICD-10-CM | POA: Insufficient documentation

## 2015-12-04 NOTE — Therapy (Signed)
[facility] [facility], [place], [address] Phone: [phone]   Fax:  [phone]  Pediatric Physical Therapy Treatment  Patient Details  Name: Yvonne Salazar MRN: [id number] Date of Birth: October 09, 1991 No Data Recorded  Encounter date: 12/04/2015      End of Session - 12/04/15 1145    Visit Number 353   Number of Visits 60   Date for PT Re-Evaluation 02/18/16   Authorization Type U[employer]   Authorization Time Period Next recertificaiton due 8/46/65    Authorization - Visit Number 1  for 2017   Authorization - Number of Visits 60   PT Start Time 0909  late arrival   PT Stop Time 0945   PT Time Calculation (min) 36 min   Activity Tolerance Patient tolerated treatment well   Behavior During Therapy Willing to participate;Alert and social   Activity Tolerance Patient tolerated treatment well      History reviewed. No pertinent past medical history.  History reviewed. No pertinent past surgical history.  There were no vitals filed for this visit.  Visit Diagnosis:Posture abnormality  Weakness generalized                    Pediatric PT Treatment - 12/04/15 1140    Subjective Information   Patient Comments Yvonne Salazar was very sick late December with respiratory illness.  "She wore out that cough assist machine."  She did not need to go to the hospital.  She has some soreness since wheelchair adjustment, and some skin breakdown on her left ischium that mom feels is improving.   PT Pediatric Exercise/Activities   Strengthening Activities UE active assisted hands clasped, lifting to chin level, X 5 reps, 2 trials.   Self-care Massage and stretch for right SCM at end of session   Activities Performed   Core Stability Details lateral and rotational perturbations while sitting in chair   Gross Motor Activities   Bilateral Coordination Yvonne Salazar demonstrated AROM of UE's, limited in left flexion,  horizontal adduction, abduction   ROM   Hip Abduction and ER Stretched bilaterally, in wheelchiar, emphasis on left hip   Comment Stretched left UE passively into endrange flexion, X 5 stretches, proloned (about 60 seconds each)   Pain   Pain Assessment No/denies pain                 Patient Education - 12/04/15 1143    Education Provided Yes   Education Description Asked Yvonne Salazar to call [employer] about wheelchair to inquire if she will need seat adjustment prior to custom seating option; also asked K to perform daily AAROM and PROM (wall walk) for right UE flexion   Person(s) Educated Patient;Mother   Method Education Verbal explanation;Observed session;Discussed session   Comprehension Returned demonstration          Peds PT Short Term Goals - 08/21/15 1102    PEDS PT  SHORT TERM GOAL #1   Title Yvonne Salazar will be able to hold her hips in a neutral position for at least 30 minutes when in wheelchair.   Baseline Yvonne Salazar tends to windsweep her hips and move her left foot to her right foot rest within 5 minutes of being prompted due to tightness in her right hip.   Time 6   Period Months   Status New   PEDS PT  SHORT TERM GOAL #2   Title Yvonne Salazar will have knees extended to flexion point of 45 degrees to decrease knee  contractions.   Baseline Yvonne Salazar cannot be extended beyond 30-35 degrees out of 90 degrees of flexion at her knees (right tighter than left).   Status Not Met   PEDS PT  SHORT TERM GOAL #3   Title Yvonne Salazar will be able to work her right arm against gravity for five minutes without propping it on her wheelchair arm rest.   Baseline Yvonne Salazar needs to rest her right arm after 1-2 minutes typically.  Her right arm is dominant, and she uses it frequently for her work.   Time 6   Period Months   Status New   PEDS PT  SHORT TERM GOAL #4   Title Yvonne Salazar will tolerate wearing new compression stockings for 4 hours a day.   Baseline Fit today for compression stockings.  She has never worn them  before.     Time 6   Period Months   Status New   PEDS PT  SHORT TERM GOAL #5   Title Yvonne Salazar will be able to maintain a vocal "humming" exhalation for greater than 5 seconds.   Status Achieved   PEDS PT  SHORT TERM GOAL #6   Title Yvonne Salazar will be able to sit and turn about 45 degrees to her right to see someone sitting behind her.   Status Achieved   PEDS PT  SHORT TERM GOAL #7   Title Yvonne Salazar will be able to lift toes from wheelchair when she is sitting with her knees flexed.   Status Achieved          Peds PT Long Term Goals - 08/21/15 1106    PEDS PT  LONG TERM GOAL #1   Title Yvonne Salazar will direct a caregiver (other than family) to provide daily care for her so that she could function in the community without a family member over a multiple day period.   Baseline Yvonne Salazar's mother has been able to travel away from [place] for more than one day now that her sister lives in town.   Status Achieved   PEDS PT  LONG TERM GOAL #2   Title Yvonne Salazar will make fully informed decisions about medical equipment and rehab equipment that can help her maximize her function.    Status On-going          Plan - 12/04/15 1146    Clinical Impression Statement Yvonne Salazar demonstrates decreased AROM of left UE.  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  Custom seating options are highly recommended.     PT plan Continue PT every other week to increase Yvonne Salazar's comfort and function.        Problem List There are no active problems to display for this patient.   Yvonne Salazar 12/04/2015, 11:49 AM  [place], [place], [address] Phone: [phone]   Fax:  [phone]  Name: Yvonne Salazar MRN: [id number] Date of Birth: Aug 04, 1991  Yvonne Salazar, PT 12/04/2015 11:49 AM Phone: [phone] Fax: [phone]

## 2015-12-11 ENCOUNTER — Ambulatory Visit: Payer: 59 | Admitting: Physical Therapy

## 2015-12-18 ENCOUNTER — Ambulatory Visit: Payer: 59 | Admitting: Physical Therapy

## 2015-12-18 ENCOUNTER — Encounter: Payer: Self-pay | Admitting: Physical Therapy

## 2015-12-18 DIAGNOSIS — M2450 Contracture, unspecified joint: Secondary | ICD-10-CM

## 2015-12-18 DIAGNOSIS — R293 Abnormal posture: Secondary | ICD-10-CM

## 2015-12-18 DIAGNOSIS — R52 Pain, unspecified: Secondary | ICD-10-CM

## 2015-12-18 DIAGNOSIS — R531 Weakness: Secondary | ICD-10-CM

## 2015-12-18 NOTE — Therapy (Signed)
[place], [place], [address] Phone: [phone]   Fax:  [phone]  Pediatric Physical Therapy Treatment  Patient Details  Name: Yvonne Salazar MRN: [id number] Date of Birth: July 29, 1991 No Data Recorded  Encounter date: 12/18/2015      End of Session - 12/18/15 1258    Visit Number [id number]   Number of Visits 60   Date for PT Re-Evaluation 02/18/16   Authorization Type [employer]   Authorization Time Period Next recertificaiton due 1/74/08    Authorization - Visit Number 2  2017   Authorization - Number of Visits 60   PT Start Time 1448   PT Stop Time 1201   PT Time Calculation (min) 45 min   Activity Tolerance Patient tolerated treatment well   Behavior During Therapy Willing to participate;Alert and social      History reviewed. No pertinent past medical history.  History reviewed. No pertinent past surgical history.  There were no vitals filed for this visit.  Visit Diagnosis:Weakness generalized  Posture abnormality  Flexion contractures  Pain                    Pediatric PT Treatment - 12/18/15 1254    Subjective Information   Patient Comments Yvonne Salazar is nervous about seeing pulmonologist Dr. Alveta Heimlich tomorrow because he wants her to gain weight and has discussed a PEG tube.  She is down from 57 lb to closer to 53.   PT Pediatric Exercise/Activities   Strengthening Activities UE active and PROM, AAROM for left arm.  Flexion, extension, eccentric control; abduction and adduction; and protraction/retraction, 10 each activity   Self-care Massage to calves, thighs, scaps and neck   Gross Motor Activities   Prone/Extension Reclined in w/c to about 45 degrees for some chest and shoulder manual work.   Pain   Pain Assessment 0-10  5/10 on right hip when abducted and flexed simultaneously                 Patient Education - 12/18/15 1257    Education Provided  Yes   Education Description Yvonne Salazar interested in meeting massage therapist, Maylon Peppers with PT present at next PT session; also asked K to perform protraciton/retraction 10 times a few times a day   Person(s) Educated Patient;Mother   Method Education Verbal explanation;Observed session;Discussed session   Comprehension Verbalized understanding          Peds PT Short Term Goals - 08/21/15 1102    PEDS PT  SHORT TERM GOAL #1   Title Yvonne Salazar will be able to hold her hips in a neutral position for at least 30 minutes when in wheelchair.   Baseline Yvonne Salazar tends to windsweep her hips and move her left foot to her right foot rest within 5 minutes of being prompted due to tightness in her right hip.   Time 6   Period Months   Status New   PEDS PT  SHORT TERM GOAL #2   Title Yvonne Salazar will have knees extended to flexion point of 45 degrees to decrease knee contractions.   Baseline Yvonne Salazar cannot be extended beyond 30-35 degrees out of 90 degrees of flexion at her knees (right tighter than left).   Status Not Met   PEDS PT  SHORT TERM GOAL #3   Title Yvonne Salazar will be able to work her right arm against gravity for five minutes without propping it on her wheelchair arm rest.   Baseline Yvonne Salazar  needs to rest her right arm after 1-2 minutes typically.  Her right arm is dominant, and she uses it frequently for her work.   Time 6   Period Months   Status New   PEDS PT  SHORT TERM GOAL #4   Title Yvonne Salazar will tolerate wearing new compression stockings for 4 hours a day.   Baseline Fit today for compression stockings.  She has never worn them before.     Time 6   Period Months   Status New   PEDS PT  SHORT TERM GOAL #5   Title Yvonne Salazar will be able to maintain a vocal "humming" exhalation for greater than 5 seconds.   Status Achieved   PEDS PT  SHORT TERM GOAL #6   Title Yvonne Salazar will be able to sit and turn about 45 degrees to her right to see someone sitting behind her.   Status Achieved   PEDS PT  SHORT TERM GOAL #7    Title Yvonne Salazar will be able to lift toes from wheelchair when she is sitting with her knees flexed.   Status Achieved          Peds PT Long Term Goals - 08/21/15 1106    PEDS PT  LONG TERM GOAL #1   Title Yvonne Salazar will direct a caregiver (other than family) to provide daily care for her so that she could function in the community without a family member over a multiple day period.   Baseline [name] mother has been able to travel away from [place] for more than one day now that her sister lives in town.   Status Achieved   PEDS PT  LONG TERM GOAL #2   Title Yvonne Salazar will make fully informed decisions about medical equipment and rehab equipment that can help her maximize her function.    Status On-going          Plan - 12/18/15 1258    Clinical Impression Statement Yvonne Salazar increasingly weak in left UE.  She lacks position change opportunities and grows increasingly contracted.   PT plan Continue PT every other week to increase [name] comfort and movement.      Problem List There are no active problems to display for this patient.   Yvonne Salazar 12/18/2015, 1:00 PM  [place], [place], [address] Phone: [phone]   Fax:  [phone]  Name: Yvonne Salazar MRN: [id number] Date of Birth: 10/20/1991  Lawerance Bach, PT 12/18/2015 1:00 PM Phone: [phone] Fax: [phone]

## 2016-01-08 ENCOUNTER — Ambulatory Visit: Payer: 59 | Attending: Pediatrics | Admitting: Physical Therapy

## 2016-01-08 ENCOUNTER — Encounter: Payer: Self-pay | Admitting: Physical Therapy

## 2016-01-08 DIAGNOSIS — M2450 Contracture, unspecified joint: Secondary | ICD-10-CM | POA: Diagnosis present

## 2016-01-08 DIAGNOSIS — R52 Pain, unspecified: Secondary | ICD-10-CM | POA: Insufficient documentation

## 2016-01-08 DIAGNOSIS — R531 Weakness: Secondary | ICD-10-CM

## 2016-01-08 DIAGNOSIS — R293 Abnormal posture: Secondary | ICD-10-CM | POA: Diagnosis present

## 2016-01-08 NOTE — Therapy (Signed)
[place], [place], [address] Phone: [phone]   Fax:  [phone]  Pediatric Physical Therapy Treatment  Patient Details  Name: Yvonne Salazar MRN: [id number] Date of Birth: [birth year] No Data Recorded  Encounter date: 01/08/2016      End of Session - 01/08/16 1227    Visit Number 355   Number of Visits 60   Date for PT Re-Evaluation 02/18/16   Authorization Type [employer]   Authorization Time Period Next recertificaiton due 7/42/59    Authorization - Visit Number 3  2017   Authorization - Number of Visits 60   PT Start Time 0821   PT Stop Time 0906   PT Time Calculation (min) 45 min   Activity Tolerance Patient tolerated treatment well   Behavior During Therapy Willing to participate;Alert and social      History reviewed. No pertinent past medical history.  History reviewed. No pertinent past surgical history.  There were no vitals filed for this visit.  Visit Diagnosis:Posture abnormality  Flexion contractures  Weakness generalized                    Pediatric PT Treatment - 01/08/16 1222    Subjective Information   Patient Comments Yvonne Salazar eager to meet massage therapist.  Yvonne Salazar reports no significant new issues since visitng Dr. Alveta Heimlich and Dr. Stephanie Acre.     PT Pediatric Exercise/Activities   Strengthening Activities Short leg kicks, with assist, X 10 each LE from wheelchair.   Self-care Massaged scalenes, traps, rt SCM, rhomboids, and calves   Activities Performed   Core Stability Details Worked out of resting posture in w/c by rotating trunk to right and leaning left shoulder off of trunk rest, worked on about 10 minutes.     Gross Motor Activities   Bilateral Coordination Put hands together to extend fingers; worked on supination of both hands and then extending arms (required assist on left UE)   ROM   Hip Abduction and ER Stretched bilaterally, in  wheelchiar   Knee Extension(hamstrings) AAROM stretches   Ankle DF active ankle pumps   Pain   Pain Assessment No/denies pain                 Patient Education - 01/08/16 1226    Education Provided Yes   Education Description Asked Yvonne Salazar to adjust posture so that left therapist does not lean on back several times thorughout the day.  Mom and Yvonne Salazar to f/u with massage therapist who had no questions for PT.  Yvonne Salazar also asked to contact [employer] about moving forward with custom seat.   Person(s) Educated Patient;Mother   Method Education Verbal explanation;Observed session;Questions addressed   Comprehension Verbalized understanding          Peds PT Short Term Goals - 08/21/15 1102    PEDS PT  SHORT TERM GOAL #1   Title Yvonne Salazar will be able to hold her hips in a neutral position for at least 30 minutes when in wheelchair.   Baseline Yvonne Salazar tends to windsweep her hips and move her left foot to her right foot rest within 5 minutes of being prompted due to tightness in her right hip.   Time 6   Period Months   Status New   PEDS PT  SHORT TERM GOAL #2   Title Yvonne Salazar will have knees extended to flexion point of 45 degrees to decrease knee contractions.   Baseline Yvonne Salazar cannot be extended beyond  30-35 degrees out of 90 degrees of flexion at her knees (right tighter than left).   Status Not Met   PEDS PT  SHORT TERM GOAL #3   Title Yvonne Salazar will be able to work her right arm against gravity for five minutes without propping it on her wheelchair arm rest.   Baseline Yvonne Salazar needs to rest her right arm after 1-2 minutes typically.  Her right arm is dominant, and she uses it frequently for her work.   Time 6   Period Months   Status New   PEDS PT  SHORT TERM GOAL #4   Title Yvonne Salazar will tolerate wearing new compression stockings for 4 hours a day.   Baseline Fit today for compression stockings.  She has never worn them before.     Time 6   Period Months   Status New   PEDS PT  SHORT TERM GOAL #5    Title Yvonne Salazar will be able to maintain a vocal "humming" exhalation for greater than 5 seconds.   Status Achieved   PEDS PT  SHORT TERM GOAL #6   Title Yvonne Salazar will be able to sit and turn about 45 degrees to her right to see someone sitting behind her.   Status Achieved   PEDS PT  SHORT TERM GOAL #7   Title Yvonne Salazar will be able to lift toes from wheelchair when she is sitting with her knees flexed.   Status Achieved          Peds PT Long Term Goals - 08/21/15 1106    PEDS PT  LONG TERM GOAL #1   Title Yvonne Salazar will direct a caregiver (other than family) to provide daily care for her so that she could function in the community without a family member over a multiple day period.   Baseline Yvonne Salazar's mother has been able to travel away from [place] for more than one day now that her sister lives in town.   Status Achieved   PEDS PT  LONG TERM GOAL #2   Title Yvonne Salazar will make fully informed decisions about medical equipment and rehab equipment that can help her maximize her function.    Status On-going          Plan - 01/08/16 1228    Clinical Impression Statement Yvonne Salazar demonstrates tightness in strongest musculature, on her right side.  This tightness leads to discomfort and fatigue.   PT plan Continue PT every other week to increase Yvonne Salazar's AROM and promote optimal postural alignment.      Problem List There are no active problems to display for this patient.   Zaryan Yakubov 01/08/2016, 12:30 PM  [place], [place], [address] Phone: [phone]   Fax:  [phone]  Name: Yvonne Salazar MRN: [id number] Date of Birth: 1991-01-21  Lawerance Bach, PT 01/08/2016 12:30 PM Phone: [phone] Fax: [phone]

## 2016-01-22 ENCOUNTER — Ambulatory Visit: Payer: 59 | Admitting: Physical Therapy

## 2016-01-22 ENCOUNTER — Encounter: Payer: Self-pay | Admitting: Physical Therapy

## 2016-01-22 DIAGNOSIS — R293 Abnormal posture: Secondary | ICD-10-CM | POA: Diagnosis not present

## 2016-01-22 DIAGNOSIS — R531 Weakness: Secondary | ICD-10-CM

## 2016-01-22 DIAGNOSIS — M2450 Contracture, unspecified joint: Secondary | ICD-10-CM

## 2016-01-22 DIAGNOSIS — R52 Pain, unspecified: Secondary | ICD-10-CM

## 2016-01-22 NOTE — Therapy (Signed)
[place], [place], [address] Phone: [phone]   Fax:  [phone]  Pediatric Physical Therapy Treatment  Patient Details  Name: Yvonne Salazar MRN: [id number] Date of Birth: 04/10/1991 No Data Recorded  Encounter date: 01/22/2016      End of Session - 01/22/16 1216    Visit Number [id number]   Number of Visits 60   Date for PT Re-Evaluation 02/18/16   Authorization Type U[employer]   Authorization Time Period Next recertificaiton due 2/67/12    Authorization - Visit Number 4  2017   Authorization - Number of Visits 60   PT Start Time 0824   PT Stop Time 0904   PT Time Calculation (min) 40 min   Activity Tolerance Patient tolerated treatment well   Behavior During Therapy Willing to participate;Alert and social      History reviewed. No pertinent past medical history.  History reviewed. No pertinent past surgical history.  There were no vitals filed for this visit.  Visit Diagnosis:Weakness generalized  Pain  Posture abnormality  Flexion contractures                    Pediatric PT Treatment - 01/22/16 0950    Subjective Information   Patient Comments Yvonne Salazar has increased parasethia and pain in distal left LE related to her menstrual cycle, mother believes.   PT Pediatric Exercise/Activities   Strengthening Activities Held UE's up off of arm rests (assist for left arm) for about 30 seconds at a time, 5 trials, with neck and trunk ROM during that time.   Gross Motor Activities   Bilateral Coordination Moving arms against gravity, and avoiding leaning to right (which relieves pressure on right hip)   ROM   Hip Abduction and ER Stretched in w/c into parallel hip abduction   Knee Extension(hamstrings) AAROM from sitting, both LE's   Ankle DF active sustained hold and repetitve foot pumping when hamstrings stretched   Comment Massaged right thigh musculature and calf  musculature during stretches   Pain   Pain Assessment FLACC  6-9 out of 10 in distal right LE, relief in fl. and wt shift                 Patient Education - 01/22/16 1216    Education Provided Yes   Education Description Yvonne Salazar to f/u with [employer] to schedule molded custom seat as soon as possible, as this contributes to asymmetric posture and pressures.   Person(s) Educated Patient;Mother   Method Education Verbal explanation;Observed session;Questions addressed   Comprehension Verbalized understanding          Peds PT Short Term Goals - 08/21/15 1102    PEDS PT  SHORT TERM GOAL #1   Title Yvonne Salazar will be able to hold her hips in a neutral position for at least 30 minutes when in wheelchair.   Baseline Yvonne Salazar tends to windsweep her hips and move her left foot to her right foot rest within 5 minutes of being prompted due to tightness in her right hip.   Time 6   Period Months   Status New   PEDS PT  SHORT TERM GOAL #2   Title Yvonne Salazar will have knees extended to flexion point of 45 degrees to decrease knee contractions.   Baseline Yvonne Salazar cannot be extended beyond 30-35 degrees out of 90 degrees of flexion at her knees (right tighter than left).   Status Not Met   PEDS PT  SHORT TERM GOAL #3   Title Yvonne Salazar will be able to work her right arm against gravity for five minutes without propping it on her wheelchair arm rest.   Baseline Yvonne Salazar needs to rest her right arm after 1-2 minutes typically.  Her right arm is dominant, and she uses it frequently for her work.   Time 6   Period Months   Status New   PEDS PT  SHORT TERM GOAL #4   Title Yvonne Salazar will tolerate wearing new compression stockings for 4 hours a day.   Baseline Fit today for compression stockings.  She has never worn them before.     Time 6   Period Months   Status New   PEDS PT  SHORT TERM GOAL #5   Title Yvonne Salazar will be able to maintain a vocal "humming" exhalation for greater than 5 seconds.   Status Achieved   PEDS PT   SHORT TERM GOAL #6   Title Yvonne Salazar will be able to sit and turn about 45 degrees to her right to see someone sitting behind her.   Status Achieved   PEDS PT  SHORT TERM GOAL #7   Title Yvonne Salazar will be able to lift toes from wheelchair when she is sitting with her knees flexed.   Status Achieved          Peds PT Long Term Goals - 08/21/15 1106    PEDS PT  LONG TERM GOAL #1   Title Yvonne Salazar will direct a caregiver (other than family) to provide daily care for her so that she could function in the community without a family member over a multiple day period.   Baseline Yvonne Salazar's mother has been able to travel away from [place] for more than one day now that her sister lives in town.   Status Achieved   PEDS PT  LONG TERM GOAL #2   Title Yvonne Salazar will make fully informed decisions about medical equipment and rehab equipment that can help her maximize her function.    Status On-going          Plan - 01/22/16 1220    Clinical Impression Statement Yvonne Salazar has increased pressure under right hip due to postural asymmetry and pelvic obliquity.  She is having increased discomfort and some parasethia in her right lower extremity.   PT plan Continue PT every other week to increase Yvonne Salazar's strength and range of motion and postural therapist.        Problem List There are no active problems to display for this patient.   Yvonne Salazar 01/22/2016, 12:26 PM  [place], [place], [address] Phone: [phone]   Fax:  [phone]  Name: Yvonne Salazar MRN: [id number] Date of Birth: Aug 24, 1991  Lawerance Bach, PT 01/22/2016 12:26 PM Phone: [phone] Fax: [phone]

## 2016-02-05 ENCOUNTER — Ambulatory Visit: Payer: 59 | Attending: Pediatrics | Admitting: Physical Therapy

## 2016-02-05 ENCOUNTER — Encounter: Payer: Self-pay | Admitting: Physical Therapy

## 2016-02-05 DIAGNOSIS — R293 Abnormal posture: Secondary | ICD-10-CM | POA: Diagnosis present

## 2016-02-05 DIAGNOSIS — R52 Pain, unspecified: Secondary | ICD-10-CM | POA: Insufficient documentation

## 2016-02-05 DIAGNOSIS — R531 Weakness: Secondary | ICD-10-CM

## 2016-02-05 DIAGNOSIS — M2450 Contracture, unspecified joint: Secondary | ICD-10-CM | POA: Insufficient documentation

## 2016-02-05 NOTE — Therapy (Signed)
[place], [place], [address] Phone: [phone]   Fax:  [phone]  Pediatric Physical Therapy Treatment  Patient Details  Name: Yvonne Salazar MRN: [id number] Date of Birth: 11-25-1991 No Data Recorded  Encounter date: 02/05/2016      End of Session - 02/05/16 1012    Visit Number 357   Number of Visits 60   Date for PT Re-Evaluation 02/18/16   Authorization Type [employer]   Authorization Time Period Next recertification due 02/01/61   Authorization - Visit Number 5  2017   Authorization - Number of Visits 60   PT Start Time 0822   PT Stop Time 0904   PT Time Calculation (min) 42 min   Activity Tolerance Patient tolerated treatment well   Behavior During Therapy Willing to participate      History reviewed. No pertinent past medical history.  History reviewed. No pertinent past surgical history.  There were no vitals filed for this visit.  Visit Diagnosis:Weakness generalized - Plan: PT PLAN OF CARE CERT/RE-CERT  Pain - Plan: PT PLAN OF CARE CERT/RE-CERT  Posture abnormality - Plan: PT PLAN OF CARE CERT/RE-CERT  Flexion contractures - Plan: PT PLAN OF CARE CERT/RE-CERT                    Pediatric PT Treatment - 02/05/16 1006    Subjective Information   Patient Comments Yvonne Salazar was fitted last week for custom mold seat last Thursday, and told to expect 4 weeks before it is delivered.   PT Pediatric Exercise/Activities   Strengthening Activities Held UE's off arm rests, biltearl for 30 second trial, left UE for 30 second trial, and five minutes for right UE   Activities Performed   Core Stability Details Experimented with position of wheelchair back for recline to avoid propping head in right UE, about 25 degree tilt seemed to work   ROM   Knee Extension(hamstrings) PROM prolonged stretch, and then 20 beats at end-range (20 to 25 degrees)   Comment Pulsing/squeezing  right hand and then extending fingers X 10 trials   Pain   Pain Assessment FLACC  1/10 in right wrist after propping                 Patient Education - 02/05/16 1011    Education Provided Yes   Education Description Asked Yvonne Salazar to avoid right hand propping of head, as this is leading to torque and pain, and to recline instead.  Because Yvonne Salazar feels breathing is compromised in this position, she can use cannula at home.   Person(s) Educated Patient;Mother   Method Education Verbal explanation;Observed session;Questions addressed   Comprehension Verbalized understanding          Peds PT Short Term Goals - 02/05/16 1015    PEDS PT  SHORT TERM GOAL #1   Title Yvonne Salazar will be able to hold her hips in a neutral position for at least 30 minutes when in wheelchair.   Baseline Seat being modified to custom seat because Yvonne Salazar cannot achieve neutral due to asymmetry and obliquity.   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   Baseline Yvonne Salazar has started having pain in right wrist after working on projects, pain is minimal, but new (1/10).   Time 6   Period Months   Status New  PEDS PT  SHORT TERM GOAL #3   Title Yvonne Salazar will be able to work her right arm against gravity for five minutes without propping it on her wheelchair arm rest.   Status Achieved   PEDS PT  SHORT TERM GOAL #4   Title Yvonne Salazar will tolerate wearing new compression stockings for 4 hours a day.   Baseline Yvonne Salazar does not like compression stockings and has not tolerated wearing them.   Status Not Met   PEDS PT  SHORT TERM GOAL #5   Title Yvonne Salazar will not prop her head in her right hand during a 45 minute session, but will learn to adjust wheelchair tilt feature without verbal prompting.   Baseline Yvonne Salazar props her head in her hand at least 3-5 times in 45 minutes.   Time 6   Period Months   Status New   PEDS PT  SHORT TERM  GOAL #6   Title Yvonne Salazar will be able to lift left arm to shoulder height without use of right hand to assist.   Baseline Yvonne Salazar can only lift her left arm about 30 degrees.   Time 6   Period Months   Status New          Peds PT Long Term Goals - 02/05/16 1019    PEDS PT  LONG TERM GOAL #2   Title Yvonne Salazar will make fully informed decisions about medical equipment and rehab equipment that can help her maximize her function.    Baseline [name] equipment needs change as she is losing funciton.   Time 12   Period Months   Status On-going          Plan - 02/05/16 1013    Clinical Impression Statement Yvonne Salazar continues to have tightness which leads to pain in several joints.  Modifying her seat should improve LE comfort and slow progression of deformities and skeletal changes.  She continues to have some fatigue in neck and UE's when working at school and on her art, and benefits from continued therapy for strategies to maximize her strength and postural control.   Patient will benefit from treatment of the following deficits: Decreased ability to maintain good postural alignment;Decreased ability to participate in recreational activities;Decreased function at home and in the community   Rehab Potential Good   Clinical impairments affecting rehab potential N/A   PT Frequency Every other week   PT Duration 6 months   PT Treatment/Intervention Therapeutic activities;Therapeutic exercises;Neuromuscular reeducation;Wheelchair management;Orthotic fitting and training;Instruction proper posture/body mechanics;Self-care and home management   PT plan PT recommends continuing another six months every other week to promote postural control and maximize ROM and strength.      Problem List There are no active problems to display for this patient.   Alecsander Hattabaugh 02/05/2016, 10:22 AM  [place], [place],  [address] Phone: [phone]   Fax:  [phone]  Name: Yvonne Salazar MRN: [id number] Date of Birth: 18-Jun-1991  Lawerance Bach, PT 02/05/2016 10:22 AM Phone: [phone] Fax: [phone]

## 2016-02-19 ENCOUNTER — Encounter: Payer: Self-pay | Admitting: Physical Therapy

## 2016-02-19 ENCOUNTER — Ambulatory Visit: Payer: 59 | Admitting: Physical Therapy

## 2016-02-19 DIAGNOSIS — R531 Weakness: Secondary | ICD-10-CM

## 2016-02-19 DIAGNOSIS — M2450 Contracture, unspecified joint: Secondary | ICD-10-CM

## 2016-02-19 DIAGNOSIS — R293 Abnormal posture: Secondary | ICD-10-CM

## 2016-02-19 NOTE — Therapy (Signed)
[name], [place], [address] Phone: [phone]   Fax:  [phone]  Pediatric Physical Therapy Treatment  Patient Details  Name: Yvonne Salazar MRN: [id number] Date of Birth: 1991-07-23 No Data Recorded  Encounter date: 02/19/2016      End of Session - 02/19/16 0943    Visit Number 712   Number of Visits 60   Date for PT Re-Evaluation 08/07/16   Authorization Type [employer]   Authorization Time Period Next recertification due 03/04/79   Authorization - Visit Number 6  2017   Authorization - Number of Visits 60   PT Start Time 0822   PT Stop Time 0905   PT Time Calculation (min) 43 min   Activity Tolerance Patient tolerated treatment well   Behavior During Therapy Willing to participate      History reviewed. No pertinent past medical history.  History reviewed. No pertinent past surgical history.  There were no vitals filed for this visit.  Visit Diagnosis:Weakness generalized  Posture abnormality  Flexion contractures                    Pediatric PT Treatment - 02/19/16 0916    Subjective Information   Patient Comments Yvonne Salazar states that it is harder to comfortable at night and her sacrum is often sore on her right side.  Mom is looking for a new geomat to sleep on.  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   PT Pediatric Exercise/Activities   Strengthening Activities Yvonne Salazar held left arm out in abduction and at flexion to 60 degrees after placed there by PT for less than 3 seconds at a time, 10 trials.  She also performed active assisted lift/ flexion and abduction of left shoulder X10 trials.   Self-care Massaged at bilateral trapezius, focus on left side due to tight musculature that was easily palpated.   Gross Motor Activities   Bilateral Coordination Clasped hands at midline and lifted.   ROM   Hip Abduction and ER Stretched in seat with  distraction.   Ankle DF active df when hips distracted   Comment Focused on left ER passively in shoulder at start of session with some glenohumeral and scapular mobilizations.   Pain   Pain Assessment No/denies pain                 Patient Education - 02/19/16 0942    Education Provided Yes   Education Description Asked Yvonne Salazar to work on left shoulder isometric ER sets with left elbow resting/pushing into armrest.  Asked her to hold these for 10-30 seconds, 2-5 reps at a time, daily.   Person(s) Educated Patient;Mother   Method Education Verbal explanation;Observed session;Demonstration   Comprehension Returned demonstration          Peds PT Short Term Goals - 02/05/16 1015    PEDS PT  SHORT TERM GOAL #1   Title Yvonne Salazar will be able to hold her hips in a neutral position for at least 30 minutes when in wheelchair.   Baseline Seat being modified to custom seat because Yvonne Salazar cannot achieve neutral due to asymmetry and obliquity.   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   Baseline Yvonne Salazar has started having pain in right wrist after working on projects, pain is minimal, but new (  1/10).   Time 6   Period Months   Status New   PEDS PT  SHORT TERM GOAL #3   Title Yvonne Salazar will be able to work her right arm against gravity for five minutes without propping it on her wheelchair arm rest.   Status Achieved   PEDS PT  SHORT TERM GOAL #4   Title Yvonne Salazar will tolerate wearing new compression stockings for 4 hours a day.   Baseline Yvonne Salazar does not like compression stockings and has not tolerated wearing them.   Status Not Met   PEDS PT  SHORT TERM GOAL #5   Title Yvonne Salazar will not prop her head in her right hand during a 45 minute session, but will learn to adjust wheelchair tilt feature without verbal prompting.   Baseline Yvonne Salazar props her head in her hand at least 3-5 times in 45 minutes.    Time 6   Period Months   Status New   PEDS PT  SHORT TERM GOAL #6   Title Yvonne Salazar will be able to lift left arm to shoulder height without use of right hand to assist.   Baseline Yvonne Salazar can only lift her left arm about 30 degrees.   Time 6   Period Months   Status New          Peds PT Long Term Goals - 02/05/16 1019    PEDS PT  LONG TERM GOAL #2   Title Yvonne Salazar will make fully informed decisions about medical equipment and rehab equipment that can help her maximize her function.    Baseline [name] equipment needs change as she is losing funciton.   Time 12   Period Months   Status On-going          Plan - 02/19/16 0944    Clinical Impression Statement Yvonne Salazar is extremely weak in extremities and tightness and posture grow increasingly asymmetric.  She continues to have excellent use of her hands.  She grows tight and fatigued in her neck and shoulders, left more than right, when working on her art projects and benefits from joint range of motion, manipulation and massage.     PT plan Contiue PT every other week to increase [name] AROM and comfort.      Problem List There are no active problems to display for this patient.   SAWULSKI,CARRIE 02/19/2016, 9:46 AM  [place], [place], [address] Phone: [phone]   Fax:  [phone]  Name: Yvonne Salazar MRN: [id number] Date of Birth: 1991-08-12  Lawerance Bach, PT 02/19/2016 9:46 AM Phone: [phone] Fax: [phone]

## 2016-03-04 ENCOUNTER — Encounter: Payer: Self-pay | Admitting: Physical Therapy

## 2016-03-04 ENCOUNTER — Ambulatory Visit: Payer: 59 | Attending: Pediatrics | Admitting: Physical Therapy

## 2016-03-04 DIAGNOSIS — R293 Abnormal posture: Secondary | ICD-10-CM | POA: Insufficient documentation

## 2016-03-04 DIAGNOSIS — R52 Pain, unspecified: Secondary | ICD-10-CM | POA: Insufficient documentation

## 2016-03-04 DIAGNOSIS — M2450 Contracture, unspecified joint: Secondary | ICD-10-CM | POA: Insufficient documentation

## 2016-03-04 DIAGNOSIS — M6289 Other specified disorders of muscle: Secondary | ICD-10-CM | POA: Insufficient documentation

## 2016-03-04 DIAGNOSIS — R531 Weakness: Secondary | ICD-10-CM | POA: Insufficient documentation

## 2016-03-04 NOTE — Therapy (Signed)
[place], [place], [address] Phone: [phone]   Fax:  [phone]  Pediatric Physical Therapy Treatment  Patient Details  Name: Yvonne Salazar MRN: [id number] Date of Birth: Mar 05, 1991 No Data Recorded  Encounter date: 03/04/2016      End of Session - 03/04/16 0911    Visit Number 428   Number of Visits 60   Date for PT Re-Evaluation 08/07/16   Authorization Type UHC   Authorization Time Period Next recertification due 06/03/80   Authorization - Visit Number 7  2017   Authorization - Number of Visits 60   PT Start Time 0825   PT Stop Time 0900   PT Time Calculation (min) 35 min   Activity Tolerance Patient tolerated treatment well   Behavior During Therapy Willing to participate      History reviewed. No pertinent past medical history.  History reviewed. No pertinent past surgical history.  There were no vitals filed for this visit.  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  Increased "nerve pain" down right leg, new at top of ankle.  Has not received new custom seat yet.   PT Pediatric Exercise/Activities   Strengthening Activities Worked on bicep curls X 10 each, 2 reps, independent in right arm, AAROM with left.  AAROM with hands together for shoulder abduction, elbows flexed and hands clasped, X 10 reps, 2 sets with vc's to rotate toward left side to avoid overusing right UE.  Yvonne Salazar also performed tricep sets by leaning forward in chair and pushing elbows back after shoulder elevation and depression, X 10 reps, 2 sets.  Finally, with chair reclined, K's left arm was lifted overhead and then she required assist to control descent X 10.  She performed 10 on  right UE wihtout assistance.   Activities Performed   Core Stability Details Changed recline angle in chair throughout session, allowing for increased recline when fatigued or when Yvonne Salazar needed assistance to move left arm overhead.  She used recline feature about every 3-4 minutes.   ROM   Knee Extension(hamstrings) passive stretch in chair   Ankle DF active ankle pumps when PT extended knee, X 15 each side   Pain   Pain Assessment No/denies pain  reports fatigue in shoudlers with today's exercises, no pain                 Patient Education - 03/04/16 0910    Education Provided Yes   Education Description Asked Yvonne Salazar to practice some form of a-g UE exercise each day, at least 10 reps; also encouraged her to move neck and back away from seat when performing UE ther ex to engage proximal muscles   Person(s) Educated Patient;Mother   Method Education Verbal explanation;Observed session;Demonstration   Comprehension Returned demonstration          Peds PT Short Term Goals - 02/05/16 1015    PEDS PT  SHORT TERM GOAL #1   Title Yvonne Salazar will be able to hold her hips in a neutral position for at least 30 minutes when in wheelchair.   Baseline Seat being modified to custom seat because Yvonne Salazar cannot achieve neutral due to asymmetry and  obliquity.   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   Baseline Yvonne Salazar has started having pain in right wrist after working on projects, pain is minimal, but new (1/10).   Time 6   Period Months   Status New   PEDS PT  SHORT TERM GOAL #3   Title Yvonne Salazar will be able to work her right arm against gravity for five minutes without propping it on her wheelchair arm rest.   Status Achieved   PEDS PT  SHORT TERM GOAL #4   Title Yvonne Salazar will tolerate wearing new compression stockings for 4 hours a day.   Baseline Yvonne Salazar does not like compression stockings and has  not tolerated wearing them.   Status Not Met   PEDS PT  SHORT TERM GOAL #5   Title Yvonne Salazar will not prop her head in her right hand during a 45 minute session, but will learn to adjust wheelchair tilt feature without verbal prompting.   Baseline Yvonne Salazar props her head in her hand at least 3-5 times in 45 minutes.   Time 6   Period Months   Status New   PEDS PT  SHORT TERM GOAL #6   Title Yvonne Salazar will be able to lift left arm to shoulder height without use of right hand to assist.   Baseline Yvonne Salazar can only lift her left arm about 30 degrees.   Time 6   Period Months   Status New          Peds PT Long Term Goals - 02/05/16 1019    PEDS PT  LONG TERM GOAL #2   Title Yvonne Salazar will make fully informed decisions about medical equipment and rehab equipment that can help her maximize her function.    Baseline [name] equipment needs change as she is losing funciton.   Time 12   Period Months   Status On-going          Plan - 03/04/16 0912    Clinical Impression Statement Yvonne Salazar requires physical assistance to lift left UE more than an inch or two off of her arm rest.  She is most comfortable with head rotated left and laterally flexed to the right, and hips windswept to her right side.  She will benefit from custom seating due to significant asymmetry at hips and pelvis and significant weakness.   PT plan Continue PT every other week to strengthen [name] muscles and improve her postural and muscular control and endurance.      Problem List There are no active problems to display for this patient.   Quentina Fronek 03/04/2016, 9:16 AM  [place], [place], [address] Phone: [phone]   Fax:  [phone]  Name: ICHELLE HARRAL MRN: [id number] Date of Birth: 15-Jul-1991  Lawerance Bach, PT 03/04/2016 9:16 AM Phone: [phone] Fax: [phone]

## 2016-03-18 ENCOUNTER — Ambulatory Visit: Payer: 59 | Admitting: Physical Therapy

## 2016-03-18 ENCOUNTER — Encounter: Payer: Self-pay | Admitting: Physical Therapy

## 2016-03-18 DIAGNOSIS — R531 Weakness: Secondary | ICD-10-CM

## 2016-03-18 DIAGNOSIS — R293 Abnormal posture: Secondary | ICD-10-CM

## 2016-03-18 DIAGNOSIS — M6289 Other specified disorders of muscle: Secondary | ICD-10-CM | POA: Diagnosis not present

## 2016-03-18 DIAGNOSIS — M2450 Contracture, unspecified joint: Secondary | ICD-10-CM

## 2016-03-18 NOTE — Therapy (Signed)
[place], [place], [address] Phone: [phone]   Fax:  [phone]  Pediatric Physical Therapy Treatment  Patient Details  Name: Yvonne Salazar MRN: [id number] Date of Birth: 04/12/1991 No Data Recorded  Encounter date: 03/18/2016      End of Session - 03/18/16 0910    Visit Number 360   Number of Visits 60   Date for PT Re-Evaluation 08/07/16   Authorization Type U[employer]   Authorization Time Period Next recertification due 05/07/78   Authorization - Visit Number 8  2017   Authorization - Number of Visits 60   PT Start Time 0824   PT Stop Time 0904   PT Time Calculation (min) 40 min   Activity Tolerance Patient tolerated treatment well   Behavior During Therapy Willing to participate      History reviewed. No pertinent past medical history.  History reviewed. No pertinent past surgical history.  There were no vitals filed for this visit.                    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  Her right arm and neck are the most tired.  She still does not have her custom seat.  She followed up with Deberah Pelton of [employer] after our last PT appointment.   PT Pediatric Exercise/Activities   Strengthening Activities Yvonne Salazar worked on pulsing/isometric action in shoulder horizontal abd/adduction; elbow flexion; and wrist stabilization.  She would hold positions for 10 seconds, and beat repetitively 10 times for each movement.  She also worked on forearm pronation/supination, active, X 10 each.   Self-care Massage to right SCM, traps, scalenes.  Yvonne Salazar also tolearted shoulder depression and distraction bilaterally, X 10 reps.   ROM   Hip Abduction and ER Active hip abduction bilaterally X 10.   Knee Extension(hamstrings) Active assist X 10 each   Ankle DF active ankle pumps    Comment PT distracted wrist joints, ankle joints today.   Pain   Pain Assessment FLACC  2/10 at right neck during depression and palpation tight m's                 Patient Education - 03/18/16 0910    Education Provided Yes   Education Description Active depression at shoulders by pushing elbows into arm rests, repeat 10 times for shoulder and neck active movement; to perform after prolonged art work projects   [employer]) Educated Patient;Mother   Method Education Verbal explanation;Observed session;Demonstration   Comprehension Returned demonstration          Peds PT Short Term Goals - 02/05/16 1015    PEDS PT  SHORT TERM GOAL #1   Title Yvonne Salazar will be able to hold her hips in a neutral position for at least 30 minutes when in wheelchair.   Baseline Seat being modified to custom seat because Yvonne Salazar cannot achieve neutral due to asymmetry and obliquity.   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   Baseline Yvonne Salazar has started having pain in right wrist after working on projects, pain is minimal, but new (1/10).   Time 6   Period Months   Status New   PEDS PT  SHORT TERM GOAL #3  Title Yvonne Salazar will be able to work her right arm against gravity for five minutes without propping it on her wheelchair arm rest.   Status Achieved   PEDS PT  SHORT TERM GOAL #4   Title Yvonne Salazar will tolerate wearing new compression stockings for 4 hours a day.   Baseline Yvonne Salazar does not like compression stockings and has not tolerated wearing them.   Status Not Met   PEDS PT  SHORT TERM GOAL #5   Title Yvonne Salazar will not prop her head in her right hand during a 45 minute session, but will learn to adjust wheelchair tilt feature without verbal prompting.   Baseline Yvonne Salazar props her head in her hand at least 3-5 times in 45 minutes.   Time 6   Period Months   Status New   PEDS PT  SHORT TERM GOAL #6    Title Yvonne Salazar will be able to lift left arm to shoulder height without use of right hand to assist.   Baseline Yvonne Salazar can only lift her left arm about 30 degrees.   Time 6   Period Months   Status New          Peds PT Long Term Goals - 02/05/16 1019    PEDS PT  LONG TERM GOAL #2   Title Yvonne Salazar will make fully informed decisions about medical equipment and rehab equipment that can help her maximize her function.    Baseline [name] equipment needs change as she is losing funciton.   Time 12   Period Months   Status On-going          Plan - 03/18/16 0911    Clinical Impression Statement Yvonne Salazar with limited active movement, and has most freedom of movement at distal joints.  She grows incrasingly tight at proximal joints (right shoulder, neck into right lateral flexion; hips into adduction, left greatr than right) and benefits from active stretches.   PT plan Continue PT every other week to promote improved AROM and comfort.      Patient will benefit from skilled therapeutic intervention in order to improve the following deficits and impairments:     Visit Diagnosis: Posture abnormality  Flexion contractures  Muscle fatigue  Weakness generalized   Problem List There are no active problems to display for this patient.   Jentry Warnell 03/18/2016, 10:19 AM  [place], [place], [address] Phone: [phone]   Fax:  [phone]  Name: Yvonne Salazar MRN: [id number] Date of Birth: 02/20/1991  Lawerance Bach, PT 03/18/2016 10:19 AM Phone: [phone] Fax: [phone]

## 2016-04-01 ENCOUNTER — Encounter: Payer: Self-pay | Admitting: Physical Therapy

## 2016-04-01 ENCOUNTER — Ambulatory Visit: Payer: 59 | Attending: Pediatrics | Admitting: Physical Therapy

## 2016-04-01 DIAGNOSIS — M6281 Muscle weakness (generalized): Secondary | ICD-10-CM | POA: Diagnosis present

## 2016-04-01 DIAGNOSIS — R293 Abnormal posture: Secondary | ICD-10-CM | POA: Insufficient documentation

## 2016-04-01 NOTE — Therapy (Signed)
[facility] [address] [place], [place], [address] Phone: [phone]   Fax:  [phone]  Pediatric Physical Therapy Treatment  Patient Details  Name: Yvonne Salazar MRN: [id number] Date of Birth: 1991-05-14 No Data Recorded  Encounter date: 04/01/2016      End of Session - 04/01/16 1050    Visit Number 361   Number of Visits 60   Date for PT Re-Evaluation 08/07/16   Authorization Type U[employer]   Authorization Time Period Next recertification due 08/07/16   Authorization - Visit Number 9  2017   Authorization - Number of Visits 60   PT Start Time 0826  came late   PT Stop Time 0900   PT Time Calculation (min) 34 min   Activity Tolerance Patient tolerated treatment well   Behavior During Therapy Willing to participate      History reviewed. No pertinent past medical history.  History reviewed. No pertinent past surgical history.  There were no vitals filed for this visit.                    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  Her custom seat was not acceptable, as the deep posterior angle caused her to not have the ability to lean forward in her seat.   PT Pediatric Exercise/Activities   Strengthening Activities Yvonne [name] worked with yellow theraband, independently on right for flexion/extension X 10, 2 sets, and D1PNF pattern X 10.  For left UE she performed the same movements, active assisted.  She also pulled apart theraband X 10 with cues to keep left UE off of arm rests.    ROM   Hip Abduction and ER Active hip abduction when sitting after PT stretched both legs.   Ankle DF active ankle movement   Comment Also stretched right SCM with cues to look up and to the right, X 2 stretches, 60 seconds each.   Pain   Pain Assessment No/denies pain                 Patient Education -  04/01/16 1049    Education Provided Yes   Education Description No new therapeutic exercise assigned as Yvonne [name] is preparing for graduation from college this month.  Await new custom seat.   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   Status Achieved   PEDS PT  SHORT TERM GOAL #5   Title Yvonne [name] will not prop her head in her right hand during a 45 minute session, but will learn to adjust wheelchair tilt feature without verbal prompting.   Status On-going   PEDS PT  SHORT TERM GOAL #6   Title Yvonne [name] will be able to lift left arm to shoulder height without use of right hand to assist.   Status On-going          Peds PT Long Term Goals - 02/05/16 1019    PEDS PT  LONG TERM GOAL #2   Title Yvonne [name] will make fully informed decisions about medical equipment and rehab equipment that can help  her maximize her function.    Baseline Yvonne Salazar's equipment needs change as she is losing funciton.   Time 12   Period Months   Status On-going          Plan - 04/01/16 1051    Clinical Impression Statement Yvonne Salazar demonstrates decreaesd active movement in left UE and quick fatguability.  Her posture is impacted so significantly by seating system.   PT plan Continue PT every other week to increase Yvonne Salazar's movement and posture.      Patient will benefit from skilled therapeutic intervention in order to improve the following deficits and impairments:  Decreased ability to maintain good postural alignment, Decreased ability to participate in recreational activities, Decreased function at home and in the community  Visit Diagnosis: Posture abnormality  Muscle weakness (generalized)   Problem List There are no active problems  to display for this patient.   SAWULSKI,CARRIE 04/01/2016, 10:54 AM  [facility] [address] [place], [place], [address] Phone: [phone]   Fax:  [phone]  Name: Yvonne Salazar MRN: [id number] Date of Birth: 1991-10-20  Everardo Beals, PT 04/01/2016 10:54 AM Phone: [phone] Fax: [phone]

## 2016-04-15 ENCOUNTER — Ambulatory Visit: Payer: 59 | Admitting: Physical Therapy

## 2016-04-29 ENCOUNTER — Encounter: Payer: Self-pay | Admitting: Physical Therapy

## 2016-04-29 ENCOUNTER — Ambulatory Visit: Payer: 59 | Attending: Pediatrics | Admitting: Physical Therapy

## 2016-04-29 DIAGNOSIS — M6281 Muscle weakness (generalized): Secondary | ICD-10-CM | POA: Insufficient documentation

## 2016-04-29 DIAGNOSIS — R293 Abnormal posture: Secondary | ICD-10-CM

## 2016-04-29 NOTE — Therapy (Signed)
[facility] [address] [place], [place], [address] Phone: [phone]   Fax:  [phone]  Pediatric Physical Therapy Treatment  Patient Details  Name: Yvonne Salazar MRN: [id number] Date of Birth: 11-29-1991 No Data Recorded  Encounter date: 04/29/2016      End of Session - 04/29/16 1058    Visit Number 362   Number of Visits 60   Date for PT Re-Evaluation 08/07/16   Authorization Type UHC   Authorization Time Period Next recertification due 08/07/16   Authorization - Visit Number 10  2017   Authorization - Number of Visits 60   PT Start Time 0826  came late   PT Stop Time 0900   PT Time Calculation (min) 34 min   Activity Tolerance Patient tolerated treatment well   Behavior During Therapy Willing to participate      History reviewed. No pertinent past medical history.  History reviewed. No pertinent past surgical history.  There were no vitals filed for this visit.                    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  She has enjoyed relaxing since graduation, but complains of neck thightness after thesis work.     PT Pediatric Exercise/Activities   Strengthening Activities Active push ups (straight up) assisted, X 10 each side and X 10 together.  Active assisted external rotation of each arm, X 10.   Self-care Massage and joint mobilization/scapular glide of each side; massage at scalenes and traps and rhomboids   Gross Motor Activities   Bilateral Coordination yellow theraband clasped together and pull (PT supported left elbow)   ROM   Knee Extension(hamstrings) stretched each knee into end-range extension from w/c   Pain   Pain Assessment No/denies pain                 Patient Education - 04/29/16 1057    Education Provided Yes   Education Description stretch of right trap and right SCM by hooking right arm on arm rest and rotating and laterally flexing neck toward left   Person(s) Educated Patient   Method Education Verbal explanation;Demonstration   Comprehension Returned demonstration          Peds PT Short Term Goals - 04/01/16 1052    PEDS PT  SHORT TERM GOAL #2   Title Yvonne Salazar will not have complaints of pain in rigth wrist after completing school and thesis work.   Status Achieved   PEDS PT  SHORT TERM GOAL #5   Title Yvonne Salazar will not prop her head in her right hand during a 45 minute session, but will learn to adjust wheelchair tilt feature without verbal prompting.   Status On-going   PEDS PT  SHORT TERM GOAL #6   Title Yvonne Salazar will be able to lift left arm to shoulder height without use of right hand to assist.   Status On-going          Peds PT Long Term Goals - 02/05/16 1019    PEDS PT  LONG TERM GOAL #2   Title Yvonne Salazar will make fully informed decisions about medical equipment and rehab equipment that can help her maximize her function.    Baseline [name] equipment needs change as she is losing funciton.   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  Her left upper extremity is weaker, but she has more flexibility in her neck and arm.     PT plan Continue PT every other week to increase [name] A/ROM.        Patient will benefit from skilled therapeutic intervention in order to improve the following deficits and impairments:  Decreased ability to maintain good postural alignment, Decreased ability to participate in recreational activities, Decreased function at home and in the community  Visit Diagnosis: Posture abnormality  Muscle weakness (generalized)   Problem List There are no active problems to display  for this patient.   Yvonne Salazar 04/29/2016, 11:01 AM  [facility] [place], [place], [address] Phone: [phone]   Fax:  [phone]  Name: Yvonne Salazar MRN: [id number] Date of Birth: 1991/08/24  Everardo Bealsarrie Shaynah Hund, PT 04/29/2016 11:01 AM Phone: [phone] Fax: [phone]

## 2016-05-13 ENCOUNTER — Ambulatory Visit: Payer: 59

## 2016-05-13 DIAGNOSIS — R293 Abnormal posture: Secondary | ICD-10-CM | POA: Diagnosis not present

## 2016-05-13 DIAGNOSIS — M6281 Muscle weakness (generalized): Secondary | ICD-10-CM

## 2016-05-13 NOTE — Therapy (Signed)
[facility] [place], [place], [address] Phone: [phone]   Fax:  [phone]  Pediatric Physical Therapy Treatment  Patient Details  Name: Yvonne Salazar MRN: [id number] Date of Birth: May 27, 1991 No Data Recorded  Encounter date: 05/13/2016      End of Session - 05/13/16 1409    Visit Number 363   Number of Visits 60   Date for PT Re-Evaluation 08/07/16   Authorization Type UHC   Authorization Time Period Next recertification due 08/07/16   Authorization - Visit Number 11  2017   Authorization - Number of Visits 60   PT Start Time 0821   PT Stop Time 0902   PT Time Calculation (min) 41 min   Activity Tolerance Patient tolerated treatment well   Behavior During Therapy Willing to participate   Activity Tolerance Patient tolerated treatment well      History reviewed. No pertinent past medical history.  History reviewed. No pertinent past surgical history.  There were no vitals filed for this visit.                    Pediatric PT Treatment - 05/13/16 1256    Subjective Information   Patient Comments [name] reports she slept in her w/c last night, so she would like to work on LEs today.   PT Pediatric Exercise/Activities   Strengthening Activities Active ankle pumps with greater range on R side.   Self-care Massage of R SCM muscle and surrounding region.   ROM   Hip Abduction and ER Active assisted hip flexion while seated in the w/c.  Stretched further into end range hip flexion with knee flexed.   Knee Extension(hamstrings) stretched each knee into end-range extension from w/c   Ankle DF Passive ROM and stretch into DF and circumduction.   Comment Also stretched right SCM with cues to look up and to the right, X 2 stretches, 60 seconds each.   Pain   Pain Assessment No/denies pain                 Patient Education - 05/13/16 1409    Education Provided Yes   Education Description Continue with home program.   Person(s) Educated Patient;Mother   Method Education Verbal explanation;Observed session;Discussed session   Comprehension Verbalized understanding          Peds PT Short Term Goals - 04/01/16 1052    PEDS PT  SHORT TERM GOAL #2   Title [name] will not have complaints of pain in rigth wrist after completing school and thesis work.   Status Achieved   PEDS PT  SHORT TERM GOAL #5   Title [name] will not prop her head in her right hand during a 45 minute session, but will learn to adjust wheelchair tilt feature without verbal prompting.   Status On-going   PEDS PT  SHORT TERM GOAL #6   Title [name] will be able to lift left arm to shoulder height without use of right hand to assist.   Status On-going          Peds PT Long Term Goals - 02/05/16 1019    PEDS PT  LONG TERM GOAL #2   Title [name] will make fully informed decisions about medical equipment and rehab equipment that can help her maximize her function.    Baseline [name] equipment needs change as she is losing funciton.   Time 12   Period Months   Status On-going  Plan - 05/13/16 1411    Clinical Impression Statement [name] continues to demonstrate tightness throughout her extremities.  Right sternocleidomastoid muscle is also significantly tense as is visable as well as palpable.   PT plan Continue with PT every other week to increase [name] A/ROM.      Patient will benefit from skilled therapeutic intervention in order to improve the following deficits and impairments:  Decreased ability to maintain good postural alignment, Decreased ability to participate in recreational activities, Decreased function at home and in the community  Visit Diagnosis: Posture abnormality  Muscle weakness (generalized)   Problem List There are no active problems to display for this patient.   LEE,[name], PT 05/13/2016, 2:15 PM  [facility] [place], [place], [address] Phone: [phone]   Fax:  [phone]  Name: Harvin HazelKatherine G Aprea MRN: [id number] Date of Birth: Feb 25, 1991

## 2016-05-27 ENCOUNTER — Ambulatory Visit: Payer: 59 | Admitting: Physical Therapy

## 2016-05-27 ENCOUNTER — Encounter: Payer: Self-pay | Admitting: Physical Therapy

## 2016-05-27 DIAGNOSIS — M6281 Muscle weakness (generalized): Secondary | ICD-10-CM

## 2016-05-27 DIAGNOSIS — R293 Abnormal posture: Secondary | ICD-10-CM

## 2016-05-27 NOTE — Therapy (Signed)
[facility] [facility] [address] [place], [place], [address] Phone: [phone]   Fax:  [phone]  Pediatric Physical Therapy Treatment  Patient Details  Name: Yvonne Salazar MRN: [id number] Date of Birth: 11/21/1991 No Data Recorded  Encounter date: 05/27/2016      End of Session - 05/27/16 1017    Visit Number 364   Number of Visits 60   Date for PT Re-Evaluation 08/07/16   Authorization Type UHC   Authorization Time Period Next recertification due 08/07/16   Authorization - Visit Number 12  2017   Authorization - Number of Visits 60   PT Start Time 0820   PT Stop Time 0905   PT Time Calculation (min) 45 min   Activity Tolerance Patient tolerated treatment well   Behavior During Therapy Willing to participate      History reviewed. No pertinent past medical history.  History reviewed. No pertinent past surgical history.  There were no vitals filed for this visit.                    Pediatric PT Treatment - 05/27/16 0908    Subjective Information   Patient Comments Yvonne Salazar attended a session with massage therapist, and enjoyed it with deep tissue work on her neck.   PT Pediatric Exercise/Activities   Strengthening Activities Active assistive arm raises, X 5 at a time, 3 sets.  She also worked shoulder into ER with flexion, AAROM with assist of PT, both arms repeated movement X 10.     Self-care massage and actively moved scapulae   Activities Performed   Core Stability Details Reclined last 10 minutes of session,and performed neck range  of motion, rotation and flexion/extension.   ROM   Knee Extension(hamstrings) stretched each knee into end-range extension from w/c   Ankle DF ankle pumps, passively   Comment neck out of right lateral flexion   Pain   Pain Assessment No/denies pain                 Patient Education - 05/27/16 1016    Education Provided Yes   Education  Description A/AROM, arm lifts, 5 reps at time, 1-2 sets a day.   Person(s) Educated Patient;Mother   Method Education Verbal explanation;Observed session;Discussed session   Comprehension Returned demonstration          Peds PT Short Term Goals - 05/27/16 1018    PEDS PT  SHORT TERM GOAL #2   Title Yvonne Salazar will not have complaints of pain in rigth wrist after completing school and thesis work.   Status Achieved   PEDS PT  SHORT TERM GOAL #5   Title Yvonne Salazar will not prop her head in her right hand during a 45 minute session, but will learn to adjust wheelchair tilt feature without verbal prompting.   Status On-going   PEDS PT  SHORT TERM GOAL #6   Title Yvonne Salazar will be able to lift left arm to shoulder height without use of right hand to assist.   Status On-going          Peds PT Long Term Goals - 02/05/16 1019    PEDS PT  LONG TERM GOAL #2   Title Yvonne Salazar will make fully informed decisions about medical equipment and rehab equipment that can help her maximize her function.    Baseline [name] equipment needs change as she is losing funciton.   Time 12   Period Months   Status On-going  Plan - 05/27/16 1018    Clinical Impression Statement Yvonne Salazar has increased tightness, but increased strength in a smaller range of motion for right arm compared to left.  She requires assistance to lift left arm against gravity more than 30 degrees.     PT plan Continue PT every other week to increase [name] strength and funciton.        Patient will benefit from skilled therapeutic intervention in order to improve the following deficits and impairments:  Decreased ability to maintain good postural alignment, Decreased ability to participate in recreational activities, Decreased function at home and in the community  Visit Diagnosis: Posture abnormality  Muscle weakness (generalized)   Problem List There are no active problems to display for this patient.   [name] 05/27/2016,  10:22 AM  [facility] [address] [place], [place], [address] Phone: [phone]   Fax:  [phone]  Name: Yvonne Salazar MRN: [id number] Date of Birth: [birth year]  Everardo Bealsarrie Loucinda Croy, PT 05/27/2016 10:22 AM Phone: [phone] Fax: [phone]

## 2016-06-10 ENCOUNTER — Ambulatory Visit: Payer: 59 | Attending: Pediatrics | Admitting: Physical Therapy

## 2016-06-10 ENCOUNTER — Encounter: Payer: Self-pay | Admitting: Physical Therapy

## 2016-06-10 DIAGNOSIS — M6281 Muscle weakness (generalized): Secondary | ICD-10-CM | POA: Diagnosis present

## 2016-06-10 DIAGNOSIS — M6289 Other specified disorders of muscle: Secondary | ICD-10-CM

## 2016-06-10 DIAGNOSIS — R293 Abnormal posture: Secondary | ICD-10-CM | POA: Diagnosis present

## 2016-06-10 NOTE — Therapy (Signed)
[facility] [facility] [address] [place], [place], [address] Phone: [phone]   Fax:  [phone]  Pediatric Physical Therapy Treatment  Patient Details  Name: Yvonne Salazar MRN: [id number] Date of Birth: [birth year] No Data Recorded  Encounter date: 06/10/2016      End of Session - 06/10/16 0906    Visit Number 365   Number of Visits 60   Date for PT Re-Evaluation 08/07/16   Authorization Type UHC   Authorization Time Period Next recertification due 08/07/16   Authorization - Visit Number 13  2017   Authorization - Number of Visits 60   PT Start Time 0823   PT Stop Time 0903   PT Time Calculation (min) 40 min   Activity Tolerance Patient tolerated treatment well   Activity Tolerance Patient tolerated treatment well      History reviewed. No pertinent past medical history.  History reviewed. No pertinent past surgical history.  There were no vitals filed for this visit.                    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   PT Pediatric Exercise/Activities   Strengthening Activities Seated marching for 1-3 minutes at a time, in isolation; with arms alternatively reaching up, and alternatively reaching across, and with bilateral active assistive towel roll lifts.  Yvonne Salazar also performed active assistive "rowing" movement with arms.     Activities Performed   Core Stability Details In w/c, moved toward cat/cow, X 10 reps.   ROM   Ankle DF Heel lifts X 60 seconds.   Pain   Pain Assessment No/denies pain                 Patient Education - 06/10/16 0906    Education Provided Yes   Education Description seated marching (minimal movement) while driving long distance in car ([place]   Starwood HotelsPerson(s) Educated Patient;Mother   Method Education Verbal explanation;Observed session;Discussed session   Comprehension Returned demonstration          Peds PT Short Term Goals - 05/27/16 1018    PEDS PT  SHORT TERM GOAL #2   Title Yvonne Salazar will not have complaints of pain in rigth wrist after completing school and thesis work.   Status Achieved   PEDS PT  SHORT TERM GOAL #5   Title Yvonne Salazar will not prop her head in her right hand during a 45 minute session, but will learn to adjust wheelchair tilt feature without verbal prompting.   Status On-going   PEDS PT  SHORT TERM GOAL #6   Title Yvonne Salazar will be able to lift left arm to shoulder height without use of right hand to assist.   Status On-going          Peds PT Long Term Goals - 02/05/16 1019    PEDS PT  LONG TERM GOAL #2   Title Yvonne Salazar will make fully informed decisions about medical equipment and rehab equipment that can help her maximize her function.    Baseline Yvonne Salazar's equipment needs change as she is losing funciton.   Time 12   Period Months   Status On-going          Plan - 06/10/16 [id number]  Clinical Impression Statement When Yvonne Salazar moves out of her resting posture in power w/c with custom seat, her postural muscles are working and she experiences DOMS, especially after engaging UE's as she does when carving.  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     PT plan Continue PT every other week to increase Yvonne Salazar's endurance, strength and functional A/ROM.      Patient will benefit from skilled therapeutic intervention in order to improve the following deficits and impairments:  Decreased ability to maintain good postural alignment, Decreased ability to participate in recreational activities, Decreased function at home and in the community  Visit Diagnosis: Muscle weakness (generalized)  Posture abnormality  Muscle  fatigue   Problem List There are no active problems to display for this patient.   SAWULSKI,CARRIE 06/10/2016, 9:10 AM  [facility] [address] [place], [place], [address] Phone: [phone]   Fax:  [phone]  Name: Yvonne Salazar MRN: [id number] Date of Birth: 1990-12-05  Everardo Beals, PT 06/10/2016 9:10 AM Phone: [phone] Fax: [phone]

## 2016-06-24 ENCOUNTER — Encounter: Payer: Self-pay | Admitting: Physical Therapy

## 2016-06-24 ENCOUNTER — Ambulatory Visit: Payer: 59 | Admitting: Physical Therapy

## 2016-06-24 DIAGNOSIS — M6281 Muscle weakness (generalized): Secondary | ICD-10-CM

## 2016-06-24 DIAGNOSIS — M6289 Other specified disorders of muscle: Secondary | ICD-10-CM

## 2016-06-24 DIAGNOSIS — R293 Abnormal posture: Secondary | ICD-10-CM

## 2016-06-24 NOTE — Therapy (Signed)
[facility] [place], [place], [address] Phone: [phone]   Fax:  [phone]  Pediatric Physical Therapy Treatment  Patient Details  Name: JALEXUS BRETT MRN: [id number] Date of Birth: 08-07-1991 No Data Recorded  Encounter date: 06/24/2016      End of Session - 06/24/16 0956    Visit Number 366   Number of Visits 60   Date for PT Re-Evaluation 08/07/16   Authorization Type UHC   Authorization Time Period Next recertification due 08/07/16   Authorization - Visit Number 14  2017   Authorization - Number of Visits 60   PT Start Time 0826  arrived late   PT Stop Time 0903   PT Time Calculation (min) 37 min   Activity Tolerance Patient tolerated treatment well   Behavior During Therapy Willing to participate      History reviewed. No pertinent past medical history.  History reviewed. No pertinent surgical history.  There were no vitals filed for this visit.                    Pediatric PT Treatment - 06/24/16 0952      Subjective Information   Patient Comments Yvonne Salazar running late.  She had to use cough assist on her way to session.  She feels like her GERD is "acting up".  She has just resumed taking Nexium this morning.  She is very worried about getting a URI.  She plans to see Ruthine Dose on Monday for massage.       PT Pediatric Exercise/Activities   Strengthening Activities Seated marching and seated kicking (toe tap and then thrust leg into extension), X 10 each LE, 2 trials.  Yvonne Salazar also performed AAROM towel lifts X 10 each, 2 trials.   Self-care [name] self-adjusted tilt throughout session, as ther ex appeared to contribute to coughing.     ROM   Hip Abduction and ER Active resisted hip abd/add from seat   Knee Extension(hamstrings) stretched each knee into end-range extension from w/c   Ankle DF stretched each ankle ot end-range df   Comment stretched elbows to end  range extension (very significant flexion contracture on right); and stretched each shoulder to end range flexion and external rotation, alternately     Pain   Pain Assessment No/denies pain                 Patient Education - 06/24/16 0955    Education Provided Yes   Education Description toe taps and kicks, X 10 each LE, seperately (not alternating); daily, up to two sets   Person(s) Educated Patient;Mother   Method Education Verbal explanation;Discussed session   Comprehension Returned demonstration          Peds PT Short Term Goals - 06/24/16 0958      PEDS PT  SHORT TERM GOAL #2   Title Yvonne Salazar will not have complaints of pain in rigth wrist after completing school and thesis work.   Status Achieved     PEDS PT  SHORT TERM GOAL #5   Title Yvonne Salazar will not prop her head in her right hand during a 45 minute session, but will learn to adjust wheelchair tilt feature without verbal prompting.   Baseline Yvonne Salazar props her head in her hand at least 3-5 times in 45 minutes.   Status On-going     PEDS PT  SHORT TERM GOAL #6   Title Yvonne Salazar will be able to lift left  arm to shoulder height without use of right hand to assist.   Baseline Yvonne Salazar can only lift her left arm about 30 degrees.   Status On-going          Peds PT Long Term Goals - 02/05/16 1019      PEDS PT  LONG TERM GOAL #2   Title Yvonne Salazar will make fully informed decisions about medical equipment and rehab equipment that can help her maximize her function.    Baseline [name] equipment needs change as she is losing funciton.   Time 12   Period Months   Status On-going          Plan - 06/24/16 0957    Clinical Impression Statement Yvonne Salazar has increased distal right LE movement if she stabilizes or "lifts" right thigh with her left hand.  She has increased flexibility in left UE, but is weaker.  She is willing and trying to participate with sustained ther ex in wheelchair for some cardiovascular workout.     PT plan  Continue PT every other week to increase [name] general strength.        Patient will benefit from skilled therapeutic intervention in order to improve the following deficits and impairments:     Visit Diagnosis: Muscle weakness (generalized)  Posture abnormality  Muscle fatigue   Problem List There are no active problems to display for this patient.   SAWULSKI,CARRIE 06/24/2016, 10:02 AM  [facility] [address] [place], [place], [address] Phone: [phone]   Fax:  [phone]  Name: ANITRIA ROSSMILLER MRN: [id number] Date of Birth: 02/14/1991   Everardo Beals, PT 06/24/16 10:03 AM Phone: [phone] Fax: [phone]

## 2016-07-08 ENCOUNTER — Encounter: Payer: Self-pay | Admitting: Physical Therapy

## 2016-07-08 ENCOUNTER — Ambulatory Visit: Payer: 59 | Attending: Pediatrics | Admitting: Physical Therapy

## 2016-07-08 DIAGNOSIS — M6281 Muscle weakness (generalized): Secondary | ICD-10-CM

## 2016-07-08 DIAGNOSIS — R293 Abnormal posture: Secondary | ICD-10-CM | POA: Diagnosis present

## 2016-07-08 DIAGNOSIS — M6289 Other specified disorders of muscle: Secondary | ICD-10-CM | POA: Diagnosis present

## 2016-07-08 NOTE — Therapy (Signed)
[facility] [address] [place], [place], [address] Phone: [phone]   Fax:  [phone]  Pediatric Physical Therapy Treatment  Patient Details  Name: Yvonne HazelKatherine G Keiffer MRN: [id number] Date of Birth: September 11, 1991 No Data Recorded  Encounter date: 07/08/2016      End of Session - 07/08/16 0910    Visit Number [id number]   Number of Visits 60   Date for PT Re-Evaluation 08/07/16   Authorization Type U[employer]   Authorization Time Period Next recertification due 08/07/16   Authorization - Visit Number 15  2017   Authorization - Number of Visits 60   PT Start Time 0826  came late   PT Stop Time 0902   PT Time Calculation (min) 36 min   Activity Tolerance Patient tolerated treatment well   Behavior During Therapy Willing to participate      History reviewed. No pertinent past medical history.  History reviewed. No pertinent surgical history.  There were no vitals filed for this visit.                    Pediatric PT Treatment - 07/08/16 0907      Subjective Information   Patient Comments [name] [birth year] is [birth year].  She had another appointment with Deandra, massage therapist, and enjoys when she works on her neck musculature.       PT Pediatric Exercise/Activities   Strengthening Activities Shouldder shrugs, repetitive X 10; sustained X 10 seconds, X 5; and with resistance.   Self-care massaged neck musculature with focus on scalenes and traps and rhomboids     ROM   Hip Abduction and ER active hip abduction X 5 each LE   Knee Extension(hamstrings) stretched each knee into end-range extension from w/c   Ankle DF active toe lifts, X 10 each LE   Comment stretched elbows to available extension and shoulders through avaialable IR/ER     Pain   Pain Assessment 0-10  1/10 in right hip when abducting                 Patient Education - 07/08/16 0909    Education Provided Yes   Education  Description shoulder shrugs to compensate when seated and working on Youth workerart projects   Person(s) Educated Patient;Mother   Method Education Verbal explanation;Discussed session   Comprehension Returned demonstration          Peds PT Short Term Goals - 06/24/16 0958      PEDS PT  SHORT TERM GOAL #2   Title [name] will not have complaints of pain in rigth wrist after completing school and thesis work.   Status Achieved     PEDS PT  SHORT TERM GOAL #5   Title [name] will not prop her head in her right hand during a 45 minute session, but will learn to adjust wheelchair tilt feature without verbal prompting.   Baseline [name] props her head in her hand at least 3-5 times in 45 minutes.   Status On-going     PEDS PT  SHORT TERM GOAL #6   Title [name] will be able to lift left arm to shoulder height without use of right hand to assist.   Baseline [name] can only lift her left arm about 30 degrees.   Status On-going          Peds PT Long Term Goals - 02/05/16 1019      PEDS PT  LONG TERM GOAL #2   Title [name]  will make fully informed decisions about medical equipment and rehab equipment that can help her maximize her function.    Baseline [name] equipment needs change as she is losing funciton.   Time 12   Period Months   Status On-going          Plan - 07/08/16 0913    Clinical Impression Statement [name] is demonstrating minimal LE movement actively; her skeletal aligment is stabilizing now that she is sitting in her custom seat.  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     PT plan Continue PT every other week to progress home program activites to improve [name] comfort and function.        Patient will benefit from skilled therapeutic intervention in order to improve the following deficits and impairments:  Decreased ability to maintain good postural alignment, Decreased ability to  participate in recreational activities, Decreased function at home and in the community  Visit Diagnosis: Muscle weakness (generalized)  Posture abnormality  Muscle fatigue   Problem List There are no active problems to display for this patient.   SAWULSKI,CARRIE 07/08/2016, 9:16 AM  [facility] [address] [place], [place], [address] Phone: [phone]   Fax:  [phone]  Name: IYANIA DENNE MRN: [id number] Date of Birth: 1991-10-07   Everardo Beals, PT 07/08/16 9:16 AM Phone: [phone] Fax: [phone]

## 2016-07-22 ENCOUNTER — Ambulatory Visit: Payer: 59 | Admitting: Physical Therapy

## 2016-08-05 ENCOUNTER — Encounter: Payer: Self-pay | Admitting: Physical Therapy

## 2016-08-05 ENCOUNTER — Ambulatory Visit: Payer: 59 | Attending: Pediatrics | Admitting: Physical Therapy

## 2016-08-05 DIAGNOSIS — R2689 Other abnormalities of gait and mobility: Secondary | ICD-10-CM | POA: Diagnosis present

## 2016-08-05 DIAGNOSIS — M6281 Muscle weakness (generalized): Secondary | ICD-10-CM

## 2016-08-05 DIAGNOSIS — R293 Abnormal posture: Secondary | ICD-10-CM

## 2016-08-05 NOTE — Therapy (Signed)
[place], [place], [address] Phone: [phone]   Fax:  [phone]  Pediatric Physical Therapy Treatment  Patient Details  Name: Yvonne Salazar MRN: [id number] Date of Birth: 03-09-1991 No Data Recorded  Encounter date: 08/05/2016      End of Session - 08/05/16 1003    Visit Number 368   Number of Visits 60   Date for PT Re-Evaluation 10/05/16   Authorization Type UHC   Authorization Time Period Next recertification due 0/0/17   Authorization - Visit Number 16  2017   Authorization - Number of Visits 7   PT Start Time 0824  came late   PT Stop Time 0901   PT Time Calculation (min) 37 min   Activity Tolerance Patient limited by pain   Behavior During Therapy Willing to participate      History reviewed. No pertinent past medical history.  History reviewed. No pertinent surgical history.  There were no vitals filed for this visit.                    Pediatric PT Treatment - 08/05/16 0958      Subjective Information   Patient Comments Yvonne Salazar was basically "stuck" in her home for over one week while she waited on part for her wheelchair.  She needs a new chair, but family is concerned about using [employer] because of customer service.  Dad is currently in [place] and will need a hip replaced in the near future.       PT Pediatric Exercise/Activities   Strengthening Activities Shoulder shrugs and depression on right side X 20; lifting left arm A/AROM X 10 on left; actively supinating with assist both forearms X 10 each; shoulder abduction X 10, right independent, left AAROM     Activities Performed   Core Stability Details Actively lifted head to neutral and rotated neck toward right to move against right SCM tigtness     ROM   Hip Abduction and ER A/AROM hip abduction X 10 bilaterally   Knee Extension(hamstrings) held leg up with knee as extended as possible  (greater than 45 degrees flexed) and had K try to hold up (unable bilaterally)   Ankle DF toe lifts X 25 each side, simultaneous   Comment stretched fingers and forearms into extension and supination bilaterally     Pain   Pain Assessment No/denies pain                 Patient Education - 08/05/16 1002    Education Provided Yes   Education Description shoulder depression on right; try on wrist splints before next session   Person(s) Educated Patient;Mother   Method Education Verbal explanation;Discussed session   Comprehension Returned demonstration          Peds PT Short Term Goals - 08/05/16 1007      PEDS PT  SHORT TERM GOAL #1   Title Yvonne Salazar will be able to supinate forearms to neutral bilaterally and keep fingers extended.    Baseline She cannot supinate to neutral on left, and she starts to flex on right when she goes beyond neutral.     Time 6   Period Months   Status New     PEDS PT  SHORT TERM GOAL #2   Title Yvonne Salazar will be able to actively depress her right shoulder while holding neck in neutral to decrease shortening of right SCM.   Baseline She hikes right  shoulder and cannot depress without compensatory lateral flexion to right of her neck.   Time 6   Period Months   Status New     PEDS PT  SHORT TERM GOAL #3   Title Yvonne Salazar will perform 3 minutes of chair therapeutic exercise without resting.   Baseline Takes breaks after 30-60 seconds of a-g movement.   Time 6   Period Months   Status New     PEDS PT  SHORT TERM GOAL #4   Title Yvonne Salazar will simulatneously move both hips to end-range hip abduction in custom seat.   Baseline She cannot abduct past neutral resting posture.   Time 6   Period Months   Status New     PEDS PT  SHORT TERM GOAL #5   Title Yvonne Salazar will not prop her head in her right hand during a 45 minute session, but will learn to adjust wheelchair tilt feature without verbal prompting.   Baseline Often rests at least once, but may be more habit  than fatigue   Status Partially Met     PEDS PT  SHORT TERM GOAL #6   Title Yvonne Salazar will be able to lift left arm to shoulder height without use of right hand to assist.   Baseline moved from 30 degrees to 60 degrees   Status Partially Met          Peds PT Long Term Goals - 08/05/16 1011      PEDS PT  LONG TERM GOAL #1   Title Yvonne Salazar will participate in chair exercises that she can perform independently for cardiovascular health.   Baseline No sustained HEP to address.   Time 12   Period Months   Status New     PEDS PT  LONG TERM GOAL #2   Title Yvonne Salazar will make fully informed decisions about medical equipment and rehab equipment that can help her maximize her function.    Time 12   Period Months   Status On-going          Plan - 08/05/16 1004    Clinical Impression Statement Yvonne Salazar is less able to tolerat working out of her power wheelchair with custom seat because of increasing pelvic obliquity.  She does not tolerate flat surface, and she has very limited ability to hold trunk and neck against gravity without support.  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  She has limited range of motion for hips, and cannot actively abduct hip beyone neutral posture.  She is weaker in left arm than right, and right UE is tighter than left.  She is growing more limited in end-range finger extension, especially as she atempts to move out of pronation.  She has 2-/5 strength on left arm, and 3-/5 on right grossly.     Rehab Potential Good   Clinical impairments affecting rehab potential N/A   PT Frequency Every other week   PT Duration 6 months   PT Treatment/Intervention Therapeutic activities;Therapeutic exercises;Neuromuscular reeducation;Orthotic fitting and training;Wheelchair management;Self-care and home management;Manual techniques   PT plan Recommend continuing PT every other week to increase A/ROM and P/ROM, and postural  stability to avoid further progression of flexor contractures.  Maximizing comfort is also important for [name] quality of life.  Working on cardiovascular fitness is a challenge that will also be addressed in PT.      Patient will benefit from skilled therapeutic intervention in order to improve the following deficits  and impairments:  Decreased ability to maintain good postural alignment, Decreased ability to participate in recreational activities, Decreased function at home and in the community  Visit Diagnosis: Muscle weakness (generalized) - Plan: PT plan of care cert/re-cert  Posture abnormality - Plan: PT plan of care cert/re-cert  Decreased mobility - Plan: PT plan of care cert/re-cert   Problem List There are no active problems to display for this patient.   SAWULSKI,CARRIE 08/05/2016, 10:15 AM  [place], [place], [address] Phone: [phone]   Fax:  [phone]  Name: ANALEI WHINERY MRN: [id number] Date of Birth: [birth year]   Lawerance Bach, PT 08/05/16 10:15 AM Phone: [phone] Fax: [phone]

## 2016-08-19 ENCOUNTER — Ambulatory Visit: Payer: 59 | Admitting: Physical Therapy

## 2016-08-26 ENCOUNTER — Ambulatory Visit: Payer: 59 | Admitting: Physical Therapy

## 2016-08-26 ENCOUNTER — Encounter: Payer: Self-pay | Admitting: Physical Therapy

## 2016-08-26 DIAGNOSIS — M6281 Muscle weakness (generalized): Secondary | ICD-10-CM

## 2016-08-26 DIAGNOSIS — R293 Abnormal posture: Secondary | ICD-10-CM

## 2016-08-26 NOTE — Therapy (Signed)
[place], [place], [address] Phone: [phone]   Fax:  [phone]  Pediatric Physical Therapy Treatment  Patient Details  Name: Yvonne Salazar MRN: [id number] Date of Birth: December 20, 1990 No Data Recorded  Encounter date: 08/26/2016      End of Session - 08/26/16 1234    Visit Number 369   Number of Visits 60   Date for PT Re-Evaluation 10/05/16   Authorization Type 10   Authorization Time Period Next recertification due 07/31/10   Authorization - Visit Number 17  2017   Authorization - Number of Visits 60   PT Start Time 1031   PT Stop Time 1116   PT Time Calculation (min) 45 min   Activity Tolerance Patient tolerated treatment well   Behavior During Therapy Willing to participate      History reviewed. No pertinent past medical history.  History reviewed. No pertinent surgical history.  There were no vitals filed for this visit.                    Pediatric PT Treatment - 08/26/16 1157      Subjective Information   Patient Comments Yvonne Salazar has some radiating challenges in left distal LE, that she thinks is related to her hip position.  Yvonne Salazar also complains of tightness in right lateral trunk (intercostals) and right thigh.       PT Pediatric Exercise/Activities   Strengthening Activities Seated marching, with assistance, X 10 each LE.  Heel raises in wheelchair with feet on foot plates, X 10 reps X 2 trials.   Self-care Taught K how to massage right intercostals and right thigh musculature.       Therapeutic Activities   Therapeutic Activity Details Distraction at intercostals on right side through trunk along with massage, deep.       ROM   Hip Abduction and ER A/AROM hip abduction X 10 bilaterally   Ankle DF active pf/df X 20 while PT massaged gastroc/soleus muscle bellies   Comment depressed right shoulder while passivley stretching neck out of right lateral  flexion, then K had to activley hold neck in neutral while PT depressed shoulder     Pain   Pain Assessment 0-10  2/10 in calf muscles when rubbed.                   Patient Education - 08/26/16 1223    Education Provided Yes   Education Description calf raises, X 10 reps, X 5-8 reps each day until next PT appointment   Person(s) Educated Patient;Mother   Method Education Verbal explanation;Discussed session   Comprehension Returned demonstration          Peds PT Short Term Goals - 08/05/16 1007      PEDS PT  SHORT TERM GOAL #1   Title Yvonne Salazar will be able to supinate forearms to neutral bilaterally and keep fingers extended.    Baseline She cannot supinate to neutral on left, and she starts to flex on right when she goes beyond neutral.     Time 6   Period Months   Status New     PEDS PT  SHORT TERM GOAL #2   Title Yvonne Salazar will be able to actively depress her right shoulder while holding neck in neutral to decrease shortening of right SCM.   Baseline She hikes right shoulder and cannot depress without compensatory lateral flexion to right of her neck.   Time 6  Period Months   Status New     PEDS PT  SHORT TERM GOAL #3   Title Yvonne Salazar will perform 3 minutes of chair therapeutic exercise without resting.   Baseline Takes breaks after 30-60 seconds of a-g movement.   Time 6   Period Months   Status New     PEDS PT  SHORT TERM GOAL #4   Title Yvonne Salazar will simulatneously move both hips to end-range hip abduction in custom seat.   Baseline She cannot abduct past neutral resting posture.   Time 6   Period Months   Status New     PEDS PT  SHORT TERM GOAL #5   Title Yvonne Salazar will not prop her head in her right hand during a 45 minute session, but will learn to adjust wheelchair tilt feature without verbal prompting.   Baseline Often rests at least once, but may be more habit than fatigue   Status Partially Met     PEDS PT  SHORT TERM GOAL #6   Title Yvonne Salazar will be able to lift  left arm to shoulder height without use of right hand to assist.   Baseline moved from 30 degrees to 60 degrees   Status Partially Met          Peds PT Long Term Goals - 08/05/16 1011      PEDS PT  LONG TERM GOAL #1   Title Yvonne Salazar will participate in chair exercises that she can perform independently for cardiovascular health.   Baseline No sustained HEP to address.   Time 12   Period Months   Status New     PEDS PT  LONG TERM GOAL #2   Title Yvonne Salazar will make fully informed decisions about medical equipment and rehab equipment that can help her maximize her function.    Time 12   Period Months   Status On-going          Plan - 08/26/16 1235    Clinical Impression Statement Yvonne Salazar has minimal changes in posture throughout the day and signfiicant weakness.  Therefore, she has discomfort associated with contractures and lack of muscle action.     PT plan Continue PT every other week to increase [name] A/ROM and P/ROM to promote increased comfort and maximize postural stability.      Patient will benefit from skilled therapeutic intervention in order to improve the following deficits and impairments:  Decreased ability to maintain good postural alignment, Decreased ability to participate in recreational activities, Decreased function at home and in the community  Visit Diagnosis: Muscle weakness (generalized)  Posture abnormality   Problem List There are no active problems to display for this patient.   SAWULSKI,CARRIE 08/26/2016, 12:40 PM  [place], [place], [address] Phone: [phone]   Fax:  [phone]  Name: Yvonne Salazar MRN: [id number] Date of Birth: 1991/02/02   Lawerance Bach, PT 08/26/16 12:40 PM Phone: [phone] Fax: [phone]

## 2016-09-02 ENCOUNTER — Encounter: Payer: Self-pay | Admitting: Physical Therapy

## 2016-09-02 ENCOUNTER — Ambulatory Visit: Payer: 59 | Attending: Pediatrics | Admitting: Physical Therapy

## 2016-09-02 DIAGNOSIS — M6281 Muscle weakness (generalized): Secondary | ICD-10-CM | POA: Insufficient documentation

## 2016-09-02 DIAGNOSIS — R293 Abnormal posture: Secondary | ICD-10-CM | POA: Diagnosis present

## 2016-09-02 NOTE — Therapy (Signed)
[place], [place], [address] Phone: [phone]   Fax:  [phone]  Pediatric Physical Therapy Treatment  Patient Details  Name: Yvonne Salazar MRN: [id number] Date of Birth: [birth year] No Data Recorded  Encounter date: 09/02/2016      End of Session - 09/02/16 1001    Visit Number [id number]   Number of Visits 60   Date for PT Re-Evaluation 10/05/16   Authorization Type UHC   Authorization Time Period Next recertification due 12/03/38   Authorization - Visit Number 18  2017   Authorization - Number of Visits 59   PT Start Time 0867  came late, traffic   PT Stop Time 0900   PT Time Calculation (min) 32 min   Activity Tolerance Patient tolerated treatment well   Behavior During Therapy Willing to participate      History reviewed. No pertinent past medical history.  History reviewed. No pertinent surgical history.  There were no vitals filed for this visit.                    Pediatric PT Treatment - 09/02/16 0955      Subjective Information   Patient Comments Yvonne Salazar did exercises for calf muscles this week, but focused mostly on left.  She did not have mom massage calves this week.  "There just wasn't a good time."     PT Pediatric Exercise/Activities   Strengthening Activities Seated heel raises, both legs together X 10 and reciprocal X 10.  Also supinated and abducted arms X 20 each, X 10 together (AAROM on left UE).   Self-care massaged calves     Activities Performed   Core Stability Details encouraged holding neck in more neutral posture (avoiding strong right lateral fleixon) thorughout session     ROM   Hip Abduction and ER encouraged more neutral foot posture, as Yvonne Salazar at times allows left foot to migrate to right foot rest   Comment stretched forearms into supination, elbow into extension and shoulder into horizontal abudction X 3 reps, 60 seconds each stretch     Pain   Pain Assessment 0-10  laughing with pain 7/10, consistent ea left calf palpation                 Patient Education - 09/02/16 1000    Education Provided Yes   Education Description asked Yvonne Salazar to continue to do daily heel raises X 10 each day, multiple times a day, specified to do them together; also asked Yvonne Salazar to perform active supination with shoulders horizontally abducted X 10 each day   Person(s) Educated Patient;Mother   Method Education Verbal explanation;Discussed session   Comprehension Returned demonstration          Peds PT Short Term Goals - 08/05/16 1007      PEDS PT  SHORT TERM GOAL #1   Title Yvonne Salazar will be able to supinate forearms to neutral bilaterally and keep fingers extended.    Baseline She cannot supinate to neutral on left, and she starts to flex on right when she goes beyond neutral.     Time 6   Period Months   Status New     PEDS PT  SHORT TERM GOAL #2   Title Yvonne Salazar will be able to actively depress her right shoulder while holding neck in neutral to decrease shortening of right SCM.   Baseline She hikes right shoulder and cannot depress without compensatory lateral flexion to  right of her neck.   Time 6   Period Months   Status New     PEDS PT  SHORT TERM GOAL #3   Title Yvonne Salazar will perform 3 minutes of chair therapeutic exercise without resting.   Baseline Takes breaks after 30-60 seconds of a-g movement.   Time 6   Period Months   Status New     PEDS PT  SHORT TERM GOAL #4   Title Yvonne Salazar will simulatneously move both hips to end-range hip abduction in custom seat.   Baseline She cannot abduct past neutral resting posture.   Time 6   Period Months   Status New     PEDS PT  SHORT TERM GOAL #5   Title Yvonne Salazar will not prop her head in her right hand during a 45 minute session, but will learn to adjust wheelchair tilt feature without verbal prompting.   Baseline Often rests at least once, but may be more habit than fatigue   Status  Partially Met     PEDS PT  SHORT TERM GOAL #6   Title Yvonne Salazar will be able to lift left arm to shoulder height without use of right hand to assist.   Baseline moved from 30 degrees to 60 degrees   Status Partially Met          Peds PT Long Term Goals - 08/05/16 1011      PEDS PT  LONG TERM GOAL #1   Title Yvonne Salazar will participate in chair exercises that she can perform independently for cardiovascular health.   Baseline No sustained HEP to address.   Time 12   Period Months   Status New     PEDS PT  LONG TERM GOAL #2   Title Yvonne Salazar will make fully informed decisions about medical equipment and rehab equipment that can help her maximize her function.    Time 12   Period Months   Status On-going          Plan - 09/02/16 1002    Clinical Impression Statement Yvonne Salazar is weaker with movement against gravity in left UE, and has increased tightness in right UE.  She is hypersensitive to touch, especially in left posterior calf musculature.  Yvonne Salazar is starting a home program for more active movement and use of calf musculature to determine if this may decrease hypesensitivity.   PT plan Continue PT every other week to increase Yvonne Salazar's comfort and allow her to have increased postural control and options for movement.      Patient will benefit from skilled therapeutic intervention in order to improve the following deficits and impairments:  Decreased ability to maintain good postural alignment, Decreased ability to participate in recreational activities, Decreased function at home and in the community  Visit Diagnosis: Muscle weakness (generalized)  Posture abnormality   Problem List There are no active problems to display for this patient.   Marico Buckle 09/02/2016, 10:04 AM  [place], [place], [address] Phone: [phone]   Fax:  [phone]  Name: Yvonne Salazar MRN: [id number] Date of  Birth: 1991-05-31   Lawerance Bach, PT 09/02/16 10:04 AM Phone: [phone] Fax: [phone]

## 2016-09-16 ENCOUNTER — Encounter: Payer: Self-pay | Admitting: Physical Therapy

## 2016-09-16 ENCOUNTER — Ambulatory Visit: Payer: 59 | Admitting: Physical Therapy

## 2016-09-16 DIAGNOSIS — M6281 Muscle weakness (generalized): Secondary | ICD-10-CM

## 2016-09-16 DIAGNOSIS — R293 Abnormal posture: Secondary | ICD-10-CM

## 2016-09-16 NOTE — Therapy (Signed)
[place], [place], [address] Phone: [phone]   Fax:  [phone]  Pediatric Physical Therapy Treatment  Patient Details  Name: Yvonne Salazar MRN: [id number] Date of Birth: October 03, 1991 No Data Recorded  Encounter date: 09/16/2016      End of Session - 09/16/16 1012    Visit Number [id number]   Number of Visits 60   Date for PT Re-Evaluation 10/05/16   Authorization Type [employer]   Authorization Time Period Next recertification due 05/07/61   Authorization - Visit Number 19  2017   Authorization - Number of Visits 24   PT Start Time 0821  arrived late   PT Stop Time 0900   PT Time Calculation (min) 39 min   Activity Tolerance Patient tolerated treatment well   Behavior During Therapy Willing to participate      History reviewed. No pertinent past medical history.  History reviewed. No pertinent surgical history.  There were no vitals filed for this visit.                    Pediatric PT Treatment - 09/16/16 1005      Subjective Information   Patient Comments Yvonne Salazar reports today is a good day for her muscles, and she feels less sensitive.  She reports that her left foot is always numb, and that she has intermittent parasthesias throughout her left foot on the plantar aspect and the insole.       PT Pediatric Exercise/Activities   Strengthening Activities When reclined about 30 degrees, Yvonne Salazar flexed chin toward chest five times  When seated more upright, she performed 20 shoulder shrugs.       Activities Performed   Core Stability Details During deep tissue massage to traps and levator scapulae and her suboccipital muscle, Yvonne Salazar actively laterally flexed and rotaed her neck.       ROM   Hip Abduction and ER encouraged more neutral foot posture, as Yvonne Salazar at times allows left foot to migrate to right foot rest   Knee Extension(hamstrings) stretched to end range (at least to negative 60  degrees) bilaterally   Ankle DF active movement in ankles   Comment stretched forearms toward supination and elbows into extension simultaneously while asking Yvonne Salazar to extend fingers     Pain   Pain Assessment No/denies pain                 Patient Education - 09/16/16 1011    Education Provided Yes   Education Description shoulder shrugs daily X 20 reps   Person(s) Educated Patient;Mother   Method Education Verbal explanation;Discussed session   Comprehension Returned demonstration          Peds PT Short Term Goals - 08/05/16 1007      PEDS PT  SHORT TERM GOAL #1   Title Yvonne Salazar will be able to supinate forearms to neutral bilaterally and keep fingers extended.    Baseline She cannot supinate to neutral on left, and she starts to flex on right when she goes beyond neutral.     Time 6   Period Months   Status New     PEDS PT  SHORT TERM GOAL #2   Title Yvonne Salazar will be able to actively depress her right shoulder while holding neck in neutral to decrease shortening of right SCM.   Baseline She hikes right shoulder and cannot depress without compensatory lateral flexion to right of her neck.   Time  6   Period Months   Status New     PEDS PT  SHORT TERM GOAL #3   Title Yvonne Salazar will perform 3 minutes of chair therapeutic exercise without resting.   Baseline Takes breaks after 30-60 seconds of a-g movement.   Time 6   Period Months   Status New     PEDS PT  SHORT TERM GOAL #4   Title Yvonne Salazar will simulatneously move both hips to end-range hip abduction in custom seat.   Baseline She cannot abduct past neutral resting posture.   Time 6   Period Months   Status New     PEDS PT  SHORT TERM GOAL #5   Title Yvonne Salazar will not prop her head in her right hand during a 45 minute session, but will learn to adjust wheelchair tilt feature without verbal prompting.   Baseline Often rests at least once, but may be more habit than fatigue   Status Partially Met     PEDS PT  SHORT TERM GOAL #6    Title Yvonne Salazar will be able to lift left arm to shoulder height without use of right hand to assist.   Baseline moved from 30 degrees to 60 degrees   Status Partially Met          Peds PT Long Term Goals - 08/05/16 1011      PEDS PT  LONG TERM GOAL #1   Title Yvonne Salazar will participate in chair exercises that she can perform independently for cardiovascular health.   Baseline No sustained HEP to address.   Time 12   Period Months   Status New     PEDS PT  LONG TERM GOAL #2   Title Yvonne Salazar will make fully informed decisions about medical equipment and rehab equipment that can help her maximize her function.    Time 12   Period Months   Status On-going          Plan - 09/16/16 1013    Clinical Impression Statement Yvonne Salazar continues to have weakness and tightness throughout, especially in neck musculature. She benefits from small movements within her range of motion, actively, to avoid increased contractures.     PT plan Continue PT every other week to increase [name] comfort and promote improved posture as able.        Patient will benefit from skilled therapeutic intervention in order to improve the following deficits and impairments:  Decreased ability to maintain good postural alignment, Decreased ability to participate in recreational activities, Decreased function at home and in the community  Visit Diagnosis: Muscle weakness (generalized)  Posture abnormality   Problem List There are no active problems to display for this patient.   Kristofor Michalowski 09/16/2016, 10:17 AM  [place], [place], [address] Phone: [phone]   Fax:  [phone]  Name: Yvonne Salazar MRN: [id number] Date of Birth: 1991/04/24   Lawerance Bach, PT 09/16/16 10:17 AM Phone: [phone] Fax: [phone]

## 2016-09-30 ENCOUNTER — Encounter: Payer: Self-pay | Admitting: Physical Therapy

## 2016-09-30 ENCOUNTER — Ambulatory Visit: Payer: 59 | Attending: Pediatrics | Admitting: Physical Therapy

## 2016-09-30 DIAGNOSIS — M6281 Muscle weakness (generalized): Secondary | ICD-10-CM

## 2016-09-30 DIAGNOSIS — R293 Abnormal posture: Secondary | ICD-10-CM | POA: Diagnosis present

## 2016-09-30 NOTE — Therapy (Signed)
[facility] [address] [place], [place], [address] Phone: [phone]   Fax:  [phone]  Pediatric Physical Therapy Treatment  Patient Details  Name: Yvonne Salazar MRN: [id number] Date of Birth: 1991/03/20 No Data Recorded  Encounter date: 09/30/2016      End of Session - 09/30/16 1053    Visit Number [id number]   Number of Visits 60   Date for PT Re-Evaluation 02/02/17   Authorization Type U[employer]   Authorization Time Period Next recertification due 02/02/17   Authorization - Visit Number 20  2017   Authorization - Number of Visits 60   PT Start Time [phone]  came late   PT Stop Time 0900   PT Time Calculation (min) 37 min   Activity Tolerance Patient tolerated treatment well   Behavior During Therapy Willing to participate      History reviewed. No pertinent past medical history.  History reviewed. No pertinent surgical history.  There were no vitals filed for this visit.                    Pediatric PT Treatment - 09/30/16 1042      Subjective Information   Patient Comments Yvonne Salazar had pain in her left hip that radiated "through the core" of her left leg on 09/17/16 after her last session.  She was not sure if she sprained  her left hip when working on ankle pumps with WB'ing from chair during last session.  ice helped and it did not persist beyond 48 hours after last session.     PT Pediatric Exercise/Activities   Strengthening Activities Yvonne Salazar perform A/AROM LAQ's X 5 reps, X 2 trials each LE.  Yvonne Salazar also performed glut sets with emphasis on pushing femur into PT's hand/wheelchair seat, worked bilaterally X 10 reps, and then 5 reps holding about 6 seconds each.     ROM   Hip Abduction and ER asked Yvonne Salazar to keep feet in neutral on footplates; Yvonne Salazar tends to pf through ankles and prop her heels on the backs of foot plates and she allows her left hip to adduct so that left foot rests on right foot plate   Knee  Extension(hamstrings) distracted hip and knee on left side and stretched left knee to end range extension for about 2 miinutes; performed same stretch for about 30 seconds on right   Ankle DF active ankle pumps, open chain, while knee was stretched   Comment deep tissue massage performed with focus on trigger points at right rhomboid (upper), left upper traps toward neck insertion, and right levator scapulae; also massaged suboccipital triangle; massage performed about 35 minutes of session by SPT     Pain   Pain Assessment FLACC  1/10 during rhomboid trigger point                 Patient Education - 09/30/16 1052    Education Provided Yes   Education Description discussed shepherd's hook trigger point cane to help with trigger point self-massage   Person(s) Educated Patient;Mother   Method Education Verbal explanation;Discussed session   Comprehension Verbalized understanding          Peds PT Short Term Goals - 09/30/16 1056      PEDS PT  SHORT TERM GOAL #1   Title Yvonne Salazar will be able to supinate forearms to neutral bilaterally and keep fingers extended.    Status On-going     PEDS PT  SHORT TERM GOAL #2  Title Yvonne Salazar will be able to actively depress her right shoulder while holding neck in neutral to decrease shortening of right SCM.   Status On-going     PEDS PT  SHORT TERM GOAL #3   Title Yvonne Salazar will perform 3 minutes of chair therapeutic exercise without resting.   Status On-going     PEDS PT  SHORT TERM GOAL #4   Title Yvonne Salazar will simulatneously move both hips to end-range hip abduction in custom seat.   Status On-going          Peds PT Long Term Goals - 08/05/16 1011      PEDS PT  LONG TERM GOAL #1   Title Yvonne Salazar will participate in chair exercises that she can perform independently for cardiovascular health.   Baseline No sustained HEP to address.   Time 12   Period Months   Status New     PEDS PT  LONG TERM GOAL #2   Title Yvonne Salazar will make fully informed  decisions about medical equipment and rehab equipment that can help her maximize her function.    Time 12   Period Months   Status On-going          Plan - 09/30/16 1054    Clinical Impression Statement Yvonne Salazar had slightly increased scapular, shoulder and neck movememnt after adddressing trigger points in neck and interscapular musculature.  She can activate gluteal muscles and quads while seated in chair, isometrically, consdiering her limited range of motion and limited postural changes.     PT plan Continue PT every other week to increase Yvonne Salazar's postural stability and A/ROM.        Patient will benefit from skilled therapeutic intervention in order to improve the following deficits and impairments:  Decreased ability to maintain good postural alignment, Decreased ability to participate in recreational activities, Decreased function at home and in the community  Visit Diagnosis: Muscle weakness (generalized)  Posture abnormality   Problem List There are no active problems to display for this patient.   Yvonne Salazar 09/30/2016, 11:01 AM  [facility] [address] [place], [place], [address] Phone: [phone]   Fax:  [phone]  Name: Yvonne Salazar MRN: [id number] Date of Birth: [birth year]   Yvonne Salazar, PT 09/30/16 11:01 AM Phone: [phone] Fax: [phone]

## 2016-10-14 ENCOUNTER — Ambulatory Visit: Payer: 59 | Admitting: Physical Therapy

## 2016-10-28 ENCOUNTER — Ambulatory Visit: Payer: 59 | Admitting: Physical Therapy

## 2016-10-28 ENCOUNTER — Encounter: Payer: Self-pay | Admitting: Physical Therapy

## 2016-10-28 DIAGNOSIS — M6281 Muscle weakness (generalized): Secondary | ICD-10-CM

## 2016-10-28 DIAGNOSIS — R293 Abnormal posture: Secondary | ICD-10-CM

## 2016-10-28 NOTE — Therapy (Signed)
[facility] [address] [place], [place], [address] Phone: [phone]   Fax:  [phone]  Pediatric Physical Therapy Treatment  Patient Details  Name: Yvonne Salazar MRN: [id number] Date of Birth: 1991/09/12 No Data Recorded  Encounter date: 10/28/2016      End of Session - 10/28/16 1214    Visit Number 373   Number of Visits 60   Date for PT Re-Evaluation 02/02/17   Authorization Type UHC   Authorization Time Period Next recertification due 02/02/17   Authorization - Visit Number 21  2017   Authorization - Number of Visits 60   PT Start Time 0827  arrived late   PT Stop Time 0900   PT Time Calculation (min) 33 min   Behavior During Therapy Willing to participate   Activity Tolerance Patient tolerated treatment well      History reviewed. No pertinent past medical history.  History reviewed. No pertinent surgical history.  There were no vitals filed for this visit.                    Pediatric PT Treatment - 10/28/16 1111      Subjective Information   Patient Comments Yvonne Salazar has had increased headaches of late, as she has been traveling more.  She is very [facility].       PT Pediatric Exercise/Activities   Strengthening Activities UE movements a-g, included: elbow extension/flexion with arms pronated, then supinated, and elbow flexion to shoulders and extend and across body; running arms; finger extension; writing in the air; and arc across body (left UE unable to lift up except to not rest on arm rest).  She worked for 1 minute at a time, for 2 trials, and then 30 second intervals for two trials after that.       Activities Performed   Core Stability Details Asked K to depress right Salazar and try to move out of right lateral fleixon of neck intermittently throughout session, including while she was being massaged at traps,  scapular and occipital muscles.     ROM   Comment deep tissue massage for fifteen minutes to bilateral trap and levator scap, and occipital triangle.       Pain   Pain Assessment No/denies pain                 Patient Education - 10/28/16 1213    Education Provided Yes   Education Description Yvonne Salazar purchased a shepherd's hook massage point, and PT explained specific muscles to work on daily   Starwood HotelsPerson(s) Educated Patient;Mother   Method Education Verbal explanation;Discussed session   Comprehension Verbalized understanding          Peds PT Short Term Goals - 09/30/16 1056      PEDS PT  SHORT TERM GOAL #1   Title Yvonne Salazar to neutral bilaterally and keep fingers extended.    Status On-going     PEDS PT  SHORT TERM GOAL #2   Title Yvonne Salazar while holding neck in neutral to decrease shortening of right SCM.   Status On-going     PEDS PT  SHORT TERM GOAL #3   Title Yvonne Salazar.   Status On-going     PEDS PT  SHORT TERM GOAL #4   Title Yvonne Salazar  will simulatneously move both hips to end-range hip abduction in custom Salazar.   Status On-going          Peds PT Long Term Goals - 08/05/16 1011      PEDS PT  LONG TERM GOAL #1   Title Yvonne Salazar.   Baseline No sustained HEP to address.   Time 12   Period Months   Status New     PEDS PT  LONG TERM GOAL #2   Title Yvonne Salazar will make fully informed decisions about medical equipment and rehab equipment that can help her maximize her function.    Time 12   Period Months   Status On-going          Plan - 10/28/16 1215    Clinical Impression Statement Yvonne Salazar can move against gravity, right UE greater than left, and can sustain for at least 1 minute.  She has potential to increase movement and endurance  with sustained practice.  She is extremely tight in right SCM and Salazar, neck and scapular muscles, but is elevating her right Salazar less and more able to hold it in neutral.     PT plan Continue PT every other week to increase [name] strength and endurance.        Patient will benefit from skilled therapeutic intervention in order to improve the following deficits and impairments:  Decreased ability to maintain good postural alignment, Decreased ability to participate in recreational activities, Decreased function at home and in the community  Visit Diagnosis: Muscle weakness (generalized)  Posture abnormality   Problem List There are no active problems to display for this patient.   SAWULSKI,CARRIE 10/28/2016, 12:22 PM  [facility] [address] [place], [place], [address] Phone: [phone]   Fax:  [phone]  Name: Yvonne HazelKatherine G Degraffenreid MRN: [id number] Date of Birth: 1991-07-22   Everardo Bealsarrie Sawulski, PT 10/28/16 12:22 PM Phone: [phone] Fax: [phone]

## 2016-11-11 ENCOUNTER — Encounter: Payer: Self-pay | Admitting: Physical Therapy

## 2016-11-11 ENCOUNTER — Ambulatory Visit: Payer: 59 | Attending: Pediatrics | Admitting: Physical Therapy

## 2016-11-11 DIAGNOSIS — R293 Abnormal posture: Secondary | ICD-10-CM

## 2016-11-11 DIAGNOSIS — M6281 Muscle weakness (generalized): Secondary | ICD-10-CM | POA: Insufficient documentation

## 2016-11-11 NOTE — Therapy (Signed)
[facility] [address] [place], [place], [address] Phone: [phone]   Fax:  [phone]  Pediatric Physical Therapy Treatment  Patient Details  Name: Yvonne Salazar MRN: [id number] Date of Birth: January 07, 1991 No Data Recorded  Encounter date: 11/11/2016      End of Session - 11/11/16 0955    Visit Number 374   Number of Visits 60   Date for PT Re-Evaluation 02/02/17   Authorization Type U[employer]   Authorization Time Period Next recertification due 02/02/17   Authorization - Visit Number 22  2017   Authorization - Number of Visits 60   PT Start Time 0827  came late, needed to jump car   PT Stop Time 0900   PT Time Calculation (min) 33 min   Activity Tolerance Patient tolerated treatment well   Behavior During Therapy Willing to participate      History reviewed. No pertinent past medical history.  History reviewed. No pertinent surgical history.  There were no vitals filed for this visit.                    Pediatric PT Treatment - 11/11/16 0951      Subjective Information   Patient Comments Yvonne Salazar reports she is having more pain in right hip when lifted out of wheelchair, especially when she has pressure on her right knee.      PT Pediatric Exercise/Activities   Strengthening Activities Quad sets X 30 total.  Marching in place X 20 total (alternating 10 reps each) X 3 trials.     Activities Performed   Core Stability Details K sat upright in chair until last ten minutes when she tiltedn backward about 30 degrees.     ROM   Hip Abduction and ER Passively stretched, and then actively asked K to assist - working both legs together and alternately, worked on at least 20 minutes.   Comment deep tissue and cross friction along ITB bilaterally, last 10 minutes of session     Pain   Pain Assessment No/denies pain                 Patient Education - 11/11/16 0954    Education  Provided Yes   Education Description Asked Yvonne Salazar to march in place for 20 reps (alternating 10), 3 x/day until next session.   Person(s) Educated Patient;Mother   Method Education Verbal explanation;Observed session;Demonstration   Comprehension Returned demonstration          Peds PT Short Term Goals - 11/11/16 0957      PEDS PT  SHORT TERM GOAL #1   Title Yvonne Salazar will be able to supinate forearms to neutral bilaterally and keep fingers extended.    Status On-going     PEDS PT  SHORT TERM GOAL #2   Title Yvonne Salazar will be able to actively depress her right shoulder while holding neck in neutral to decrease shortening of right SCM.   Status On-going     PEDS PT  SHORT TERM GOAL #3   Title Yvonne Salazar will perform 3 minutes of chair therapeutic exercise without resting.   Status On-going     PEDS PT  SHORT TERM GOAL #4   Title Yvonne Salazar will simulatneously move both hips to end-range hip abduction in custom seat.   Status On-going          Peds PT Long Term Goals - 08/05/16 1011      PEDS PT  LONG TERM GOAL #  1   Title Yvonne Salazar will participate in chair exercises that she can perform independently for cardiovascular health.   Baseline No sustained HEP to address.   Time 12   Period Months   Status New     PEDS PT  LONG TERM GOAL #2   Title Yvonne Salazar will make fully informed decisions about medical equipment and rehab equipment that can help her maximize her function.    Time 12   Period Months   Status On-going          Plan - 11/11/16 0955    Clinical Impression Statement Yvonne Salazar demonstrates minimal ability to move out of resting posture in custom chair.  When she adjusts her LE's/hips/pelvis, she has to fight windsweeping in hips toward her right side.  She reports that she also is feeling less support in her lumbar region along hte right when she moves her right hip forward or back in her wheelchair.     PT plan Continue PT every other week except next session canceled because office is closed  between Christmas and New Year's with goal of maximizing [name] postural alignment and improving her comfort when moving out of resting posture.      Patient will benefit from skilled therapeutic intervention in order to improve the following deficits and impairments:  Decreased ability to maintain good postural alignment, Decreased ability to participate in recreational activities, Decreased function at home and in the community  Visit Diagnosis: Posture abnormality  Muscle weakness (generalized)   Problem List There are no active problems to display for this patient.   Yvonne Salazar 11/11/2016, 10:00 AM  [facility] [address] [place], [place], [address] Phone: [phone]   Fax:  [phone]  Name: Yvonne Salazar MRN: [id number] Date of Birth: 1991/02/09   Everardo Bealsarrie Burnell Hurta, PT 11/11/16 10:00 AM Phone: [phone] Fax: [phone]

## 2016-12-09 ENCOUNTER — Ambulatory Visit: Payer: 59 | Attending: Pediatrics | Admitting: Physical Therapy

## 2016-12-09 ENCOUNTER — Encounter: Payer: Self-pay | Admitting: Physical Therapy

## 2016-12-09 DIAGNOSIS — M6281 Muscle weakness (generalized): Secondary | ICD-10-CM

## 2016-12-09 DIAGNOSIS — M6289 Other specified disorders of muscle: Secondary | ICD-10-CM | POA: Insufficient documentation

## 2016-12-09 DIAGNOSIS — R2689 Other abnormalities of gait and mobility: Secondary | ICD-10-CM | POA: Insufficient documentation

## 2016-12-09 DIAGNOSIS — R293 Abnormal posture: Secondary | ICD-10-CM | POA: Insufficient documentation

## 2016-12-09 NOTE — Therapy (Signed)
[facility] [address] [place], [place], [address] Phone: [phone]   Fax:  [phone]  Pediatric Physical Therapy Treatment  Patient Details  Name: Yvonne Salazar MRN: [id number] Date of Birth: 1991/05/11 No Data Recorded  Encounter date: 12/09/2016      End of Session - 12/09/16 0959    Visit Number 375   Number of Visits 60   Date for PT Re-Evaluation 02/02/17   Authorization Type U[employer]   Authorization Time Period Next recertification due 02/02/17   Authorization - Visit Number 1  2018   Authorization - Number of Visits 60   PT Start Time 0824  came late   PT Stop Time 0900   PT Time Calculation (min) 36 min   Activity Tolerance Patient tolerated treatment well   Behavior During Therapy Willing to participate      History reviewed. No pertinent past medical history.  History reviewed. No pertinent surgical history.  There were no vitals filed for this visit.                    Pediatric PT Treatment - 12/09/16 0955      Subjective Information   Patient Comments Yvonne Salazar is worried about getting sick because her mom has a cold.       PT Pediatric Exercise/Activities   Strengthening Activities Asked K to attempt to put hands together with goal of approximating thenar eminences.  She can only get fingertips together.  Did this actively five times.       Activities Performed   Core Stability Details Reclined 30 minutes of session, and upright other five.       ROM   Comment A/AROM for supination bilateral forearms.  Deep tissue and cross friction work for 20 minutes at right SCM, starting at insertion at skull and working downard, coming at muscle from posterior aspect.  Also provided deep tissue massage along traps and scalenes.  Stretched bilateral elbows to end-range extension hard end feels.     Pain   Pain Assessment No/denies pain                 Patient Education -  12/09/16 0959    Education Provided Yes   Education Description Asked Yvonne Salazar to practice daily approximating hands/thenar emineces, at least 2x/day   Person(s) Educated Patient   Method Education Verbal explanation;Observed session;Demonstration   Comprehension Returned demonstration          Peds PT Short Term Goals - 11/11/16 0957      PEDS PT  SHORT TERM GOAL #1   Title Yvonne Salazar will be able to supinate forearms to neutral bilaterally and keep fingers extended.    Status On-going     PEDS PT  SHORT TERM GOAL #2   Title Yvonne Salazar will be able to actively depress her right shoulder while holding neck in neutral to decrease shortening of right SCM.   Status On-going     PEDS PT  SHORT TERM GOAL #3   Title Yvonne Salazar will perform 3 minutes of chair therapeutic exercise without resting.   Status On-going     PEDS PT  SHORT TERM GOAL #4   Title Yvonne Salazar will simulatneously move both hips to end-range hip abduction in custom seat.   Status On-going          Peds PT Long Term Goals - 08/05/16 1011      PEDS PT  LONG TERM GOAL #1   Title  Yvonne Salazar will participate in chair exercises that she can perform independently for cardiovascular health.   Baseline No sustained HEP to address.   Time 12   Period Months   Status New     PEDS PT  LONG TERM GOAL #2   Title Yvonne Salazar will make fully informed decisions about medical equipment and rehab equipment that can help her maximize her function.    Time 12   Period Months   Status On-going          Plan - 12/09/16 1000    Clinical Impression Statement Yvonne Salazar demonstrates significant tightness in all extremities, and more tightness along right neck musculature due to resting posture with head in right lateral flexion.  Tightness in forearms puts Yvonne Salazar at risk of losing mobility in fingers and wrist for extension.     PT plan Continue PT every other week to increase Yvonne Salazar's A/ROM and flexibility for variability of resting postures and to maintain funciton of  hands.        Patient will benefit from skilled therapeutic intervention in order to improve the following deficits and impairments:  Decreased ability to maintain good postural alignment, Decreased ability to participate in recreational activities, Decreased function at home and in the community  Visit Diagnosis: Posture abnormality  Muscle weakness (generalized)   Problem List There are no active problems to display for this patient.   SAWULSKI,CARRIE 12/09/2016, 10:02 AM  [facility] [address] [place], [place], [address] Phone: [phone]   Fax:  [phone]  Name: Yvonne Salazar MRN: [id number] Date of Birth: 1991/04/06   Everardo Beals, PT 12/09/16 10:02 AM Phone: [phone] Fax: [phone]

## 2016-12-16 DIAGNOSIS — G710 Muscular dystrophy: Secondary | ICD-10-CM | POA: Diagnosis not present

## 2016-12-23 ENCOUNTER — Ambulatory Visit: Payer: 59 | Admitting: Physical Therapy

## 2016-12-23 ENCOUNTER — Encounter: Payer: Self-pay | Admitting: Physical Therapy

## 2016-12-23 DIAGNOSIS — R293 Abnormal posture: Secondary | ICD-10-CM

## 2016-12-23 DIAGNOSIS — R2689 Other abnormalities of gait and mobility: Secondary | ICD-10-CM

## 2016-12-23 DIAGNOSIS — M6281 Muscle weakness (generalized): Secondary | ICD-10-CM

## 2016-12-23 DIAGNOSIS — M6289 Other specified disorders of muscle: Secondary | ICD-10-CM

## 2016-12-23 NOTE — Therapy (Signed)
[facility] [place], [place], [address] Phone: [phone]   Fax:  [phone]  Pediatric Physical Therapy Treatment  Patient Details  Name: Yvonne Salazar MRN: [id number] Date of Birth: 23-Feb-1991 No Data Recorded  Encounter date: 12/23/2016      End of Session - 12/23/16 1052    Visit Number 376   Number of Visits 60   Date for PT Re-Evaluation 02/02/17   Authorization Type UHC   Authorization Time Period Next recertification due 02/02/17   Authorization - Visit Number 2  2018   Authorization - Number of Visits 60   PT Start Time 0821   PT Stop Time 0900   PT Time Calculation (min) 39 min   Activity Tolerance Patient tolerated treatment well   Behavior During Therapy Willing to participate      History reviewed. No pertinent past medical history.  History reviewed. No pertinent surgical history.  There were no vitals filed for this visit.                    Pediatric PT Treatment - 12/23/16 0947      Subjective Information   Patient Comments Yvonne Salazar has been sick since last visit with congestion, and mild fever, starting to feel better now.  She has been sleeping in w/c, using nasal cannula and cough assist frequently.  Sees pulmonologist tomorrow.      Gross Motor Activities   Bilateral Coordination performed lateral passive movement through shoulders rhythmically to increase neck and scapular mobility, about 20 reps     ROM   Comment A/AROM for supination and shoulder flexion and ER, both arms, with cues to increase lifting arms overhead and opening chest up, X 10 total each, performed simulationeously and alternately (2 sets total); deep tissue massage along both SCM and spleinus capitis, as low as levator scapulae, rhomboids and traps     Pain   Pain Assessment No/denies pain                 Patient Education - 12/23/16 1051    Education Provided Yes   Education Description encouraged Yvonne Salazar to change positions, even when she has been sick, including reclining in bed to decrease hip flexion, for short periods, with goal of tolerating 20 minutes min daily   Person(s) Educated Patient;Mother   Method Education Verbal explanation;Observed session;Demonstration   Comprehension Verbalized understanding          Peds PT Short Term Goals - 11/11/16 0957      PEDS PT  SHORT TERM GOAL #1   Title Yvonne Salazar will be able to supinate forearms to neutral bilaterally and keep fingers extended.    Status On-going     PEDS PT  SHORT TERM GOAL #2   Title Yvonne Salazar will be able to actively depress her right shoulder while holding neck in neutral to decrease shortening of right SCM.   Status On-going     PEDS PT  SHORT TERM GOAL #3   Title Yvonne Salazar will perform 3 minutes of chair therapeutic exercise without resting.   Status On-going     PEDS PT  SHORT TERM GOAL #4   Title Yvonne Salazar will simulatneously move both hips to end-range hip abduction in custom seat.   Status On-going          Peds PT Long Term Goals - 08/05/16 1011      PEDS PT  LONG TERM GOAL #1   Title Yvonne Salazar  will participate in chair exercises that she can perform independently for cardiovascular health.   Baseline No sustained HEP to address.   Time 12   Period Months   Status New     PEDS PT  LONG TERM GOAL #2   Title Yvonne [name] will make fully informed decisions about medical equipment and rehab equipment that can help her maximize her function.    Time 12   Period Months   Status On-going          Plan - 12/23/16 1053    Clinical Impression Statement Yvonne [name] is so limited in variable positions due to significant weakness, contractures, and decreased tolerance of reclined positions, especially when congested and concerned about coughing and congestion.  Staying in wheelchair limits Yvonne Salazar's ability to change posture and can feed into increased postural deformity.     PT plan Continue PT every other  week to increase Yvonne Salazar's flexibility and postural variability.        Patient will benefit from skilled therapeutic intervention in order to improve the following deficits and impairments:  Decreased ability to maintain good postural alignment, Decreased ability to participate in recreational activities, Decreased function at home and in the community  Visit Diagnosis: Posture abnormality  Decreased mobility  Muscle weakness (generalized)  Muscle fatigue   Problem List There are no active problems to display for this patient.   Leita Lindbloom 12/23/2016, 10:56 AM  [facility] [address] [place], [place], [address] Phone: [phone]   Fax:  [phone]  Name: Harvin HazelKatherine G Flynn MRN: [id number] Date of Birth: 1991-07-02   Everardo Bealsarrie Jarman Litton, PT 12/23/16 10:56 AM Phone: [phone] Fax: [phone]

## 2016-12-24 DIAGNOSIS — G709 Myoneural disorder, unspecified: Secondary | ICD-10-CM | POA: Diagnosis not present

## 2016-12-24 DIAGNOSIS — J984 Other disorders of lung: Secondary | ICD-10-CM | POA: Diagnosis not present

## 2016-12-24 DIAGNOSIS — G710 Muscular dystrophy: Secondary | ICD-10-CM | POA: Diagnosis not present

## 2016-12-24 DIAGNOSIS — J9690 Respiratory failure, unspecified, unspecified whether with hypoxia or hypercapnia: Secondary | ICD-10-CM | POA: Diagnosis not present

## 2016-12-28 DIAGNOSIS — J9690 Respiratory failure, unspecified, unspecified whether with hypoxia or hypercapnia: Secondary | ICD-10-CM | POA: Diagnosis not present

## 2017-01-06 ENCOUNTER — Encounter: Payer: Self-pay | Admitting: Physical Therapy

## 2017-01-06 ENCOUNTER — Ambulatory Visit: Payer: 59 | Attending: Pediatrics | Admitting: Physical Therapy

## 2017-01-06 DIAGNOSIS — R293 Abnormal posture: Secondary | ICD-10-CM

## 2017-01-06 DIAGNOSIS — G710 Muscular dystrophy: Secondary | ICD-10-CM | POA: Diagnosis present

## 2017-01-06 DIAGNOSIS — R2689 Other abnormalities of gait and mobility: Secondary | ICD-10-CM

## 2017-01-06 DIAGNOSIS — M6289 Other specified disorders of muscle: Secondary | ICD-10-CM | POA: Diagnosis present

## 2017-01-06 DIAGNOSIS — M6281 Muscle weakness (generalized): Secondary | ICD-10-CM | POA: Diagnosis present

## 2017-01-06 NOTE — Therapy (Signed)
[facility] [place], [place], [address] Phone: [phone]   Fax:  [phone]  Pediatric Physical Therapy Treatment  Patient Details  Name: Yvonne Salazar MRN: [id number] Date of Birth: [birth year] No Data Recorded  Encounter date: 01/06/2017      End of Session - 01/06/17 0915    Visit Number 377   Number of Visits 60   Date for PT Re-Evaluation 02/02/17   Authorization Type UHC   Authorization Time Period Next recertification due 02/02/17   Authorization - Visit Number 3  2018   Authorization - Number of Visits 60   PT Start Time 0824   PT Stop Time 0904   PT Time Calculation (min) 40 min   Activity Tolerance Patient tolerated treatment well   Behavior During Therapy Willing to participate      History reviewed. No pertinent past medical history.  History reviewed. No pertinent surgical history.  There were no vitals filed for this visit.                    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  Yvonne Salazar saw Dr. Blair Heys, who wants to monitor CO2 levels more closely.  She said they recommend GI, but no one has a aspecific GI specialist recommnendation for someone with her diagnosis and who is her age.       PT Pediatric Exercise/Activities   Strengthening Activities K was stretched into shoulder extension with elbow flexed to end-range (about 30 degrees) and then asked to resist/push against.  She performed 5 each unilaterally, and then 10 bilaterally/simultaneously.       ROM   Hip Abduction and ER After stretching into hip abduction, K was asked to actively abduct X 5 times with cues to maintain hold (count of 3); X 5.   Comment While sitting in w/c, distracted hips.  Also massaged along traps, scalenes and  erector spinae at start of session.  Passively mobilized right glenohumeral joint and scapula before active ther ex described above.     Pain   Pain Assessment No/denies pain                 Patient Education - 01/06/17 0915    Education Provided Yes   Education Description active hip abduction when feet are in seperate foot plates, each day, X 10 each or prolonged/sustained hold X 3-5   Person(s) Educated Patient;Mother   Method Education Verbal explanation;Observed session;Demonstration   Comprehension Returned demonstration          Peds PT Short Term Goals - 11/11/16 0957      PEDS PT  SHORT TERM GOAL #1   Title Yvonne Salazar will be able to supinate forearms to neutral bilaterally and keep fingers extended.    Status On-going     PEDS PT  SHORT TERM GOAL #2   Title Yvonne Salazar will be able to actively depress her right shoulder while holding neck in neutral to decrease shortening of right SCM.   Status On-going     PEDS PT  SHORT TERM GOAL #3   Title Yvonne Salazar will perform 3 minutes of chair therapeutic exercise without resting.   Status On-going     PEDS PT  SHORT TERM GOAL #4   Title Yvonne Salazar will  simulatneously move both hips to end-range hip abduction in custom seat.   Status On-going          Peds PT Long Term Goals - 08/05/16 1011      PEDS PT  LONG TERM GOAL #1   Title Yvonne Salazar will participate in chair exercises that she can perform independently for cardiovascular health.   Baseline No sustained HEP to address.   Time 12   Period Months   Status New     PEDS PT  LONG TERM GOAL #2   Title Yvonne Salazar will make fully informed decisions about medical equipment and rehab equipment that can help her maximize her function.    Time 12   Period Months   Status On-going          Plan - 01/06/17 0916    Clinical Impression Statement Yvonne Salazar has more strength in left UE for WB'ing and pushing through UE with shoulder extended and elbow flexed, but cannot lift left arm as high as  right (30-45 degrees above arm rest).  She uses UE musculatre and neck excessively when she is carving, and has subsequent fatigue.  Her pelvic obliquity and LE contractures, along with extreme weakness, limit any active motion in LE's.     PT plan Continue PT every other week to increase Yvonne Salazar's A/ROM and comfort within functional range and activity level.        Patient will benefit from skilled therapeutic intervention in order to improve the following deficits and impairments:  Decreased ability to maintain good postural alignment, Decreased ability to participate in recreational activities, Decreased function at home and in the community  Visit Diagnosis: Posture abnormality  Decreased mobility  Muscle weakness (generalized)  Muscle fatigue   Problem List There are no active problems to display for this patient.   Zacory Fiola 01/06/2017, 9:20 AM  [facility] [address] [place], [place], [address] Phone: [phone]   Fax:  [phone]  Name: Yvonne Salazar MRN: [id number] Date of Birth: [birth year]   Everardo Bealsarrie Miran Kautzman, PT 01/06/17 9:20 AM Phone: [phone] Fax: [phone]

## 2017-01-16 DIAGNOSIS — G710 Muscular dystrophy: Secondary | ICD-10-CM | POA: Diagnosis not present

## 2017-01-20 ENCOUNTER — Encounter: Payer: Self-pay | Admitting: Physical Therapy

## 2017-01-20 ENCOUNTER — Ambulatory Visit: Payer: 59 | Admitting: Physical Therapy

## 2017-01-20 DIAGNOSIS — R293 Abnormal posture: Secondary | ICD-10-CM

## 2017-01-20 DIAGNOSIS — R2689 Other abnormalities of gait and mobility: Secondary | ICD-10-CM

## 2017-01-20 DIAGNOSIS — M6281 Muscle weakness (generalized): Secondary | ICD-10-CM

## 2017-01-20 DIAGNOSIS — G7109 Other specified muscular dystrophies: Secondary | ICD-10-CM

## 2017-01-20 NOTE — Therapy (Signed)
[facility] [place], [place], [address] Phone: [phone]   Fax:  [phone]  Pediatric Physical Therapy Treatment  Patient Details  Name: Yvonne Salazar MRN: [id number] Date of Birth: October 17, 1991 No Data Recorded  Encounter date: 01/20/2017      End of Session - 01/20/17 1131    Visit Number 378   Number of Visits 60   Date for PT Re-Evaluation 07/20/17   Authorization Type UHC   Authorization Time Period Next recertification due 07/20/17   Authorization - Visit Number 4  2018   Authorization - Number of Visits 60   PT Start Time 0822   PT Stop Time 0902   PT Time Calculation (min) 40 min   Equipment Utilized During Treatment Other (comment)  power w/c   Activity Tolerance Patient tolerated treatment well   Behavior During Therapy Willing to participate      History reviewed. No pertinent past medical history.  History reviewed. No pertinent surgical history.  There were no vitals filed for this visit.                    Pediatric PT Treatment - 01/20/17 1126      Subjective Information   Patient Comments Yvonne Salazar is excited about potential for new w/c.  Very skeptical about dropping foot rests to accomodate increased LE length (knees are flexed higher than hips).     PT Pediatric Exercise/Activities   Strengthening Activities Stretched into supination bilaterally with active finger and wrist extension.  Also stretched right knee into extension.  Stretched bilateraly ankles pf/df.  All of these activites were A/AROM   Self-care Yvonne Salazar of [employer] present to adjust current chair (dropped foot plates) and tightened back support, moving it forward to appropriate depth as this had slipped at it has loosened.  PT supported Yvonne Salazar's head while Yvonne Salazar then adjusted head support to accomodate changes in back support.  Yvonne Salazar also discussed new w/c otption.  Yvonne Salazar interested in  permobile M3 to allow anterior "reach".  PT recommends custom back support, which Yvonne Salazar agreed to.       Activities Performed   Core Stability Details Yvonne Salazar was lifted from w/c by her mother to a seat with back and arm rests (with pillows under seat and behind back).  Yvonne Salazar was able to sit, holding onto one arm rest for about 10 minutes with feet supported).     Pain   Pain Assessment FLACC  3/10 right hip with P/ROM of right LE                 Patient Education - 01/20/17 1130    Education Provided Yes   Education Description After seat adjustments, Yvonne Salazar feels that she has less support at left lateral, and PT recommends a small cushion to help her adjust to changes.  Yvonne Salazar may also need a support under her right foot short-term as she accomodates to increased stretch in hips/low back now that leg rests are lowered.     Person(s) Educated Patient;Mother   Method Education Verbal explanation;Observed session;Demonstration   Comprehension Verbalized understanding          Peds PT Short Term Goals - 01/20/17 1134      PEDS PT  SHORT TERM GOAL #1   Title Yvonne Salazar will be able to supinate forearms to neutral bilaterally and keep fingers extended.    Status Achieved     PEDS PT  SHORT TERM GOAL #  2   Title Yvonne Salazar will be able to actively depress her right shoulder while holding neck in neutral to decrease shortening of right SCM.   Status Achieved     PEDS PT  SHORT TERM GOAL #3   Title Yvonne Salazar will perform 3 minutes of chair therapeutic exercise without resting.   Status Achieved     PEDS PT  SHORT TERM GOAL #4   Title Yvonne Salazar will simulatneously move both hips to end-range hip abduction in custom seat.   Status Achieved     PEDS PT  SHORT TERM GOAL #5   Title Yvonne Salazar will receive new w/c with new custom back and tolerate new positioning.   Baseline Yvonne Salazar present on 01/20/17 to initiate new order.    Time 6   Period Months   Status New     PEDS PT  SHORT TERM GOAL #6   Title Yvonne Salazar will be able  to hold left arm off of arm rest for at least 30 seconds to allow for increased bilateral manual use.   Baseline left arm fatigues quickly and tends to rest on arm rest after 10 seconds   Time 6   Period Months   Status New     PEDS PT  SHORT TERM GOAL #7   Title Yvonne Salazar will tolerate new adjustment to foot plates that have placed her knees level with her hips without pillow behind back or pillow under right foot without complaints of fatigue or pain.   Baseline [name] foot plates were adjusted 2/22 to increase alignment, but Yvonne Salazar complained of hip flexor stretch, fatigue/stretch in low back, and propped right heel on back of foot plate to accomodate for this change in position.     Time 6   Period Months   Status New          Peds PT Long Term Goals - 01/20/17 1138      PEDS PT  LONG TERM GOAL #1   Title Yvonne Salazar will participate in chair exercises that she can perform independently for cardiovascular health.   Baseline Finding HEP work to carryover at home, and sustain for more than a moment or 2 is challenging; PT would like to continue to develop.   Time 12   Period Months   Status On-going     PEDS PT  LONG TERM GOAL #2   Title Yvonne Salazar will make fully informed decisions about medical equipment and rehab equipment that can help her maximize her function.    Baseline New w/c order initiated today.   Time 12   Period Months   Status New          Plan - 01/20/17 1132    Clinical Impression Statement Yvonne Salazar will benefit from new w/c with custom back and her function will increase with anterior tilt function to allow her to lean over art work, reach items in refrigerator, etc.  Yvonne Salazar did have increased pressure at her hips and sacrum due to sitting with knees flexed higher than hips.  Altough she was noting an increased stretch at hip flexors to sit with hips more at a 90-90 angle, she should have improved mobility/flexibility and even circulation with improved positioning.  Yvonne Salazar continues to  complain of fatigue in right arm and neck as she works on her artwork/printmaking.     Rehab Potential Good   Clinical impairments affecting rehab potential N/A   PT Frequency Every other week   PT Duration 6 months   PT Treatment/Intervention  Therapeutic activities;Therapeutic exercises;Neuromuscular reeducation;Wheelchair management;Orthotic fitting and training;Manual techniques   PT plan Continue PT every other week to increase [name] function, improve her posture and minimize secondary impairments associated with her limited mobility.        Patient will benefit from skilled therapeutic intervention in order to improve the following deficits and impairments:  Decreased ability to maintain good postural alignment, Decreased ability to participate in recreational activities, Decreased function at home and in the community  Visit Diagnosis: Posture abnormality  Decreased mobility  Muscle weakness (generalized)  Ullrich congenital muscular dystrophy (HCC)   Problem List There are no active problems to display for this patient.   Yvonne Salazar 01/20/2017, 11:40 AM  [facility] [address] [place], [place], [address] Phone: [phone]   Fax:  [phone]  Name: Yvonne Salazar MRN: [id number] Date of Birth: November 06, 1991   Everardo Bealsarrie Cheryll Keisler, PT 01/20/17 11:40 AM Phone: [phone] Fax: [phone]

## 2017-01-24 DIAGNOSIS — J9690 Respiratory failure, unspecified, unspecified whether with hypoxia or hypercapnia: Secondary | ICD-10-CM | POA: Diagnosis not present

## 2017-01-26 DIAGNOSIS — J9690 Respiratory failure, unspecified, unspecified whether with hypoxia or hypercapnia: Secondary | ICD-10-CM | POA: Diagnosis not present

## 2017-02-03 ENCOUNTER — Encounter: Payer: Self-pay | Admitting: Physical Therapy

## 2017-02-03 ENCOUNTER — Ambulatory Visit: Payer: 59 | Attending: Pediatrics | Admitting: Physical Therapy

## 2017-02-03 DIAGNOSIS — G710 Muscular dystrophy: Secondary | ICD-10-CM | POA: Diagnosis present

## 2017-02-03 DIAGNOSIS — R2689 Other abnormalities of gait and mobility: Secondary | ICD-10-CM | POA: Diagnosis present

## 2017-02-03 DIAGNOSIS — G7109 Other specified muscular dystrophies: Secondary | ICD-10-CM

## 2017-02-03 DIAGNOSIS — R293 Abnormal posture: Secondary | ICD-10-CM | POA: Diagnosis present

## 2017-02-03 DIAGNOSIS — M6281 Muscle weakness (generalized): Secondary | ICD-10-CM | POA: Diagnosis present

## 2017-02-03 NOTE — Therapy (Signed)
[facility] [address] [place], [place], [address] Phone: [phone]   Fax:  [phone]  Pediatric Physical Therapy Treatment  Patient Details  Name: Yvonne HazelKatherine G Salazar MRN: [id number] Date of Birth: 30-Sep-1991 No Data Recorded  Encounter date: 02/03/2017      End of Session - 02/03/17 1043    Visit Number [id number]   Number of Visits 60   Date for PT Re-Evaluation 07/20/17   Authorization Type UHC   Authorization Time Period Next recertification due 07/20/17   Authorization - Visit Number 5  No limit   Authorization - Number of Visits 60   PT Start Time 0824   PT Stop Time 0904   PT Time Calculation (min) 40 min   Activity Tolerance Patient tolerated treatment well   Behavior During Therapy Willing to participate      History reviewed. No pertinent past medical history.  History reviewed. No pertinent surgical history.  There were no vitals filed for this visit.                    Pediatric PT Treatment - 02/03/17 1037      Subjective Information   Patient Comments Yvonne Salazar reports that she is slowly getting used to having her foot plates lowered.  She asks her mom to intermittently lift her and 'reposition'.  She plans to speak to Dr. Paulino RilyWolters about GI specialist at next MD appointment.  She is excited about new wheelchair.       PT Pediatric Exercise/Activities   Strengthening Activities Scapular movement/depression/protraction/retraction x 10 (arms flapping with elbows bent); also extended through fingers when forearms held supinated to neutral, X 5 each     Gross Motor Activities   Bilateral Coordination massage along right SCM performed to try and achieve some shoulder depression on right and to move K out of lateral flexion of neck to right, performed about 10 minutes     ROM   Knee Extension(hamstrings) streteched from w/c with distraction at bilateral hips   Ankle DF actively perfromed  with knees stretched, X 5 and then asking K to hold final rep for 10 seconds   Comment Fingers extended and thumb extended and abducted to end-range A/AROM     Pain   Pain Assessment FLACC  2/10 trigger points right SCM and hips with LE movement                 Patient Education - 02/03/17 1041    Education Provided Yes   Education Description Asked K to actively df and hold at least 10 seconds when she finds she has propped her heels on the back of her foot plates.  Also asked her to practice 10 arm flaps at least 1 x daily until K is seen again at PT.   Person(s) Educated Patient;Mother   Method Education Verbal explanation;Observed session;Demonstration   Comprehension Returned demonstration          Peds PT Short Term Goals - 01/20/17 1134      PEDS PT  SHORT TERM GOAL #1   Title Yvonne Salazar will be able to supinate forearms to neutral bilaterally and keep fingers extended.    Status Achieved     PEDS PT  SHORT TERM GOAL #2   Title Yvonne Salazar will be able to actively depress her right shoulder while holding neck in neutral to decrease shortening of right SCM.   Status Achieved     PEDS PT  SHORT TERM  GOAL #3   Title Yvonne Dire will perform 3 minutes of chair therapeutic exercise without resting.   Status Achieved     PEDS PT  SHORT TERM GOAL #4   Title Yvonne Dire will simulatneously move both hips to end-range hip abduction in custom seat.   Status Achieved     PEDS PT  SHORT TERM GOAL #5   Title Yvonne Dire will receive new w/c with new custom back and tolerate new positioning.   Baseline Harrie Jeans present on 01/20/17 to initiate new order.    Time 6   Period Months   Status New     PEDS PT  SHORT TERM GOAL #6   Title Yvonne Dire will be able to hold left arm off of arm rest for at least 30 seconds to allow for increased bilateral manual use.   Baseline left arm fatigues quickly and tends to rest on arm rest after 10 seconds   Time 6   Period Months   Status New     PEDS PT  SHORT TERM  GOAL #7   Title Yvonne Dire will tolerate new adjustment to foot plates that have placed her knees level with her hips without pillow behind back or pillow under right foot without complaints of fatigue or pain.   Baseline [name] foot plates were adjusted 2/22 to increase alignment, but Yvonne Dire complained of hip flexor stretch, fatigue/stretch in low back, and propped right heel on back of foot plate to accomodate for this change in position.     Time 6   Period Months   Status New          Peds PT Long Term Goals - 01/20/17 1138      PEDS PT  LONG TERM GOAL #1   Title Yvonne Dire will participate in chair exercises that she can perform independently for cardiovascular health.   Baseline Finding HEP work to carryover at home, and sustain for more than a moment or 2 is challenging; PT would like to continue to develop.   Time 12   Period Months   Status On-going     PEDS PT  LONG TERM GOAL #2   Title Yvonne Dire will make fully informed decisions about medical equipment and rehab equipment that can help her maximize her function.    Baseline New w/c order initiated today.   Time 12   Period Months   Status New          Plan - 02/03/17 1045    Clinical Impression Statement Yvonne Dire is tolerating adjustments to w/c, and this will prepare her for improved positioning in new w/c with custom seat and back.  Yvonne Dire is very weak in left UE, and tighter and weaker in right UE and right neck musculature.  Her fingers remain strong and somewhat flexible with full extension of all fingers, though right side is tigher than left when foearms are not fully pronated.     PT plan Contineu PT every other week to increaes [name] independence and postural control and maintain P/ROM as her disease progresses.        Patient will benefit from skilled therapeutic intervention in order to improve the following deficits and impairments:  Decreased ability to maintain good postural alignment, Decreased ability to participate in  recreational activities, Decreased function at home and in the community  Visit Diagnosis: Posture abnormality  Muscle weakness (generalized)  Decreased mobility  Ullrich congenital muscular dystrophy (HCC)   Problem List There are no active problems to display for this  patient.   SAWULSKI,CARRIE 02/03/2017, 10:48 AM  [facility] [address] [place], [place], [address] Phone: [phone]   Fax:  [phone]  Name: DELIYAH MUCKLE MRN: [id number] Date of Birth: [birth year]  Everardo Beals, PT 02/03/17 10:48 AM Phone: [phone] Fax: [phone]

## 2017-02-13 DIAGNOSIS — G710 Muscular dystrophy: Secondary | ICD-10-CM | POA: Diagnosis not present

## 2017-02-17 ENCOUNTER — Encounter: Payer: Self-pay | Admitting: Physical Therapy

## 2017-02-17 ENCOUNTER — Ambulatory Visit: Payer: 59 | Admitting: Physical Therapy

## 2017-02-17 DIAGNOSIS — R293 Abnormal posture: Secondary | ICD-10-CM

## 2017-02-17 DIAGNOSIS — M6281 Muscle weakness (generalized): Secondary | ICD-10-CM

## 2017-02-17 DIAGNOSIS — G7109 Other specified muscular dystrophies: Secondary | ICD-10-CM

## 2017-02-17 NOTE — Therapy (Signed)
[facility] [address] [place], [place], [address] Phone: [phone]   Fax:  [phone]  Pediatric Physical Therapy Treatment  Patient Details  Name: Yvonne Salazar MRN: [id number] Date of Birth: 1991/11/10 No Data Recorded  Encounter date: 02/17/2017      End of Session - 02/17/17 1047    Visit Number [id number]   Number of Visits 60   Date for PT Re-Evaluation 07/20/17   Authorization Type UHC   Authorization Time Period Next recertification due 07/20/17   Authorization - Visit Number 6  2018   Authorization - Number of Visits 60   PT Start Time 0820   PT Stop Time 0900   PT Time Calculation (min) 40 min   Activity Tolerance Patient tolerated treatment well   Behavior During Therapy Willing to participate      History reviewed. No pertinent past medical history.  History reviewed. No pertinent surgical history.  There were no vitals filed for this visit.                    Pediatric PT Treatment - 02/17/17 1040      Subjective Information   Patient Comments Yvonne Salazar is having increased parasthesia and pain related to her left hip, radiating down her left leg . She is working with her massage therapist to try and find some relief, an plans to have massage in side-lying at her next session (her sessions are usually in her wheelchair).      PT Pediatric Exercise/Activities   Strengthening Activities Passive A/AROM PNF UE patterns D2 extension, X 10 each side.     Gross Motor Activities   Prone/Extension tilt 15-30 degrees in w/c to see if this increased any relief in left hip/LE, and K would automatically raise her right arm overhead in that position; she could be cued to rest with her right arm on arm rest.     Therapeutic Activities   Therapeutic Activity Details K was stretched along right neck lateral flexion into a more neutral posture; Deep massage also offered along quad, distal to  proximal instertion on left LE when seated.     ROM   Hip Abduction and ER Encouraged K to keep each foot on its own foot rest (as she typically places left foot on right foot rest), she maintained the entire session after prompted   Ankle DF K did not rest with her heels on back of foot rest as she has been observed to do at last session   Comment Fingers extended at end range of PNF patterning     Pain   Pain Assessment FLACC  2-4/10 left LE parasthesia                 Patient Education - 02/17/17 1046    Education Provided Yes   Education Description Encouraged K practice PNF D2 extension of right UE up to 10 times, and to try this at different angles depending on tilt of w/c; encouraged her to perform up to 2 sets a day   Person(s) Educated Patient;Mother   Method Education Verbal explanation;Observed session;Demonstration   Comprehension Returned demonstration          Peds PT Short Term Goals - 01/20/17 1134      PEDS PT  SHORT TERM GOAL #1   Title Yvonne Salazar will be able to supinate forearms to neutral bilaterally and keep fingers extended.    Status Achieved     PEDS  PT  SHORT TERM GOAL #2   Title Yvonne Salazar will be able to actively depress her right shoulder while holding neck in neutral to decrease shortening of right SCM.   Status Achieved     PEDS PT  SHORT TERM GOAL #3   Title Yvonne Salazar will perform 3 minutes of chair therapeutic exercise without resting.   Status Achieved     PEDS PT  SHORT TERM GOAL #4   Title Yvonne Salazar will simulatneously move both hips to end-range hip abduction in custom seat.   Status Achieved     PEDS PT  SHORT TERM GOAL #5   Title Yvonne Salazar will receive new w/c with new custom back and tolerate new positioning.   Baseline Yvonne Salazar present on 01/20/17 to initiate new order.    Time 6   Period Months   Status New     PEDS PT  SHORT TERM GOAL #6   Title Yvonne Salazar will be able to hold left arm off of arm rest for at least 30 seconds to allow for  increased bilateral manual use.   Baseline left arm fatigues quickly and tends to rest on arm rest after 10 seconds   Time 6   Period Months   Status New     PEDS PT  SHORT TERM GOAL #7   Title Yvonne Salazar will tolerate new adjustment to foot plates that have placed her knees level with her hips without pillow behind back or pillow under right foot without complaints of fatigue or pain.   Baseline Yvonne Salazar's foot plates were adjusted 2/22 to increase alignment, but Yvonne Salazar complained of hip flexor stretch, fatigue/stretch in low back, and propped right heel on back of foot plate to accomodate for this change in position.     Time 6   Period Months   Status New          Peds PT Long Term Goals - 01/20/17 1138      PEDS PT  LONG TERM GOAL #1   Title Yvonne Salazar will participate in chair exercises that she can perform independently for cardiovascular health.   Baseline Finding HEP work to carryover at home, and sustain for more than a moment or 2 is challenging; PT would like to continue to develop.   Time 12   Period Months   Status On-going     PEDS PT  LONG TERM GOAL #2   Title Yvonne Salazar will make fully informed decisions about medical equipment and rehab equipment that can help her maximize her function.    Baseline New w/c order initiated today.   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  Yvonne Salazar does experience increased tightness and strain in left hip after raising her right hand over head with a weight (e.g. her phone), and has DOMS the next day.  Strengthening her core through functional right UE movements against gravity may help her experience less muscle strain with daily activity when in bed.     PT plan Continue PT every other week to  increase Yvonne Salazar's strength and comfort and A/ROM.        Patient will benefit from skilled therapeutic intervention in order to improve the following deficits and impairments:  Decreased ability to maintain good postural alignment, Decreased ability to participate in recreational activities, Decreased function at home and in the community  Visit Diagnosis: Posture abnormality  Muscle weakness (generalized)  Ullrich congenital muscular dystrophy (HCC)   Problem List There are no active problems to display for this patient.   SAWULSKI,CARRIE 02/17/2017, 10:51 AM  [facility] [address] [place], [place], [address] Phone: [phone]   Fax:  [phone]  Name: Yvonne Salazar MRN: [id number] Date of Birth: [birth year]   Everardo Bealsarrie Sawulski, PT 02/17/17 10:52 AM Phone: [phone] Fax: [phone]

## 2017-02-21 DIAGNOSIS — J9690 Respiratory failure, unspecified, unspecified whether with hypoxia or hypercapnia: Secondary | ICD-10-CM | POA: Diagnosis not present

## 2017-02-24 DIAGNOSIS — J9611 Chronic respiratory failure with hypoxia: Secondary | ICD-10-CM | POA: Diagnosis not present

## 2017-02-24 DIAGNOSIS — K219 Gastro-esophageal reflux disease without esophagitis: Secondary | ICD-10-CM | POA: Diagnosis not present

## 2017-02-24 DIAGNOSIS — G710 Muscular dystrophy: Secondary | ICD-10-CM | POA: Diagnosis not present

## 2017-02-25 DIAGNOSIS — J9690 Respiratory failure, unspecified, unspecified whether with hypoxia or hypercapnia: Secondary | ICD-10-CM | POA: Diagnosis not present

## 2017-03-03 ENCOUNTER — Ambulatory Visit: Payer: 59

## 2017-03-16 DIAGNOSIS — G710 Muscular dystrophy: Secondary | ICD-10-CM | POA: Diagnosis not present

## 2017-03-17 ENCOUNTER — Ambulatory Visit: Payer: 59 | Attending: Pediatrics | Admitting: Physical Therapy

## 2017-03-17 ENCOUNTER — Encounter: Payer: Self-pay | Admitting: Physical Therapy

## 2017-03-17 DIAGNOSIS — G710 Muscular dystrophy: Secondary | ICD-10-CM | POA: Diagnosis present

## 2017-03-17 DIAGNOSIS — R293 Abnormal posture: Secondary | ICD-10-CM

## 2017-03-17 DIAGNOSIS — G7109 Other specified muscular dystrophies: Secondary | ICD-10-CM

## 2017-03-17 DIAGNOSIS — M6281 Muscle weakness (generalized): Secondary | ICD-10-CM

## 2017-03-17 NOTE — Therapy (Signed)
[facility] [address] [place], [place], [address] Phone: [phone]   Fax:  [phone]  Pediatric Physical Therapy Treatment  Patient Details  Name: Yvonne Salazar MRN: [id number] Date of Birth: [birth year] No Data Recorded  Encounter date: 03/17/2017      End of Session - 03/17/17 1232    Visit Number 381   Number of Visits 60   Date for PT Re-Evaluation 07/20/17   Authorization Type UHC   Authorization Time Period Next recertification due 07/20/17   Authorization - Visit Number 7  2018   Authorization - Number of Visits 60   PT Start Time [id number]   PT Stop Time 0900   PT Time Calculation (min) 41 min   Activity Tolerance Patient tolerated treatment well   Behavior During Therapy Willing to participate      History reviewed. No pertinent past medical history.  History reviewed. No pertinent surgical history.  There were no vitals filed for this visit.                    Pediatric PT Treatment - 03/17/17 1222      Subjective Information   Patient Comments Yvonne Salazar is trying to schedul with h Dr. Clearance Coots (MD specialist), but struggling because she is no longer "pediatric".  Yvonne Salazar also is trying to get scheduled with a GI specialist.  Also, Yvonne Salazar is pursuing option of using CBD to address joint and nerve pain.     PT Pediatric Exercise/Activities   Strengthening Activities K performed active shoulder rolls with emphasis on backward and downward.       Gross Motor Activities   Bilateral Coordination Lifted UE's from arm rests X 10 seconds each, 5 reps, while PT was providing cross friction massage to neck musculature (SCM and scalenes)     ROM   Knee Extension(hamstrings) stretched from wheelchair   Comment Provided gentle scapular mobilizations to increase depression and retraction on both sides.  When passively supinating (using opposite hand), K actively extended fingers, X 10 each and then  one sustained stretch.     Pain   Pain Assessment No/denies pain                 Patient Education - 03/17/17 1231    Education Provided Yes   Education Description Active assistance supination combined finger extension, X 10 reps with one hold at the end for at least 10 seconds, and shoulder rolls, daily   Person(s) Educated Patient;Mother   Method Education Verbal explanation;Observed session;Demonstration   Comprehension Returned demonstration          Peds PT Short Term Goals - 03/17/17 1234      PEDS PT  SHORT TERM GOAL #5   Title Yvonne Salazar will receive new w/c with new custom back and tolerate new positioning.   Status On-going     PEDS PT  SHORT TERM GOAL #6   Title Yvonne Salazar will be able to hold left arm off of arm rest for at least 30 seconds to allow for increased bilateral manual use.   Status Achieved     PEDS PT  SHORT TERM GOAL #7   Title Yvonne Salazar will tolerate new adjustment to foot plates that have placed her knees level with her hips without pillow behind back or pillow under right foot without complaints of fatigue or pain.   Status Achieved          Peds PT Long Term Goals - 01/20/17  1138      PEDS PT  LONG TERM GOAL #1   Title Yvonne Salazar will participate in chair exercises that she can perform independently for cardiovascular health.   Baseline Finding HEP work to carryover at home, and sustain for more than a moment or 2 is challenging; PT would like to continue to develop.   Time 12   Period Months   Status On-going     PEDS PT  LONG TERM GOAL #2   Title Yvonne Salazar will make fully informed decisions about medical equipment and rehab equipment that can help her maximize her function.    Baseline New w/c order initiated today.   Time 12   Period Months   Status New          Plan - 03/17/17 1233    Clinical Impression Statement Yvonne Salazar holds shoulders tightly elevated and benefits from shoulder mobility to relax and decrease tension.  She has tightness  throughout her body and limited postural options, which increase her pain and decrease A/ROM.     PT plan Continue PT every other week to increase [name] A/ROM and postural opportunities for variance.        Patient will benefit from skilled therapeutic intervention in order to improve the following deficits and impairments:  Decreased ability to maintain good postural alignment, Decreased ability to participate in recreational activities, Decreased function at home and in the community  Visit Diagnosis: Posture abnormality  Muscle weakness (generalized)  Ullrich congenital muscular dystrophy (HCC)   Problem List There are no active problems to display for this patient.   Dontarious Schaum 03/17/2017, 12:42 PM  [facility] [address] [place], [place], [address] Phone: [phone]   Fax:  [phone]   Everardo Beals, PT 03/17/17 12:42 PM Phone: [phone] Fax: [phone]   Name: Yvonne Salazar MRN: [id number] Date of Birth: Jan 03, 1991

## 2017-03-24 DIAGNOSIS — J9690 Respiratory failure, unspecified, unspecified whether with hypoxia or hypercapnia: Secondary | ICD-10-CM | POA: Diagnosis not present

## 2017-03-28 DIAGNOSIS — J9690 Respiratory failure, unspecified, unspecified whether with hypoxia or hypercapnia: Secondary | ICD-10-CM | POA: Diagnosis not present

## 2017-03-31 ENCOUNTER — Ambulatory Visit: Payer: 59 | Attending: Pediatrics | Admitting: Physical Therapy

## 2017-03-31 ENCOUNTER — Encounter: Payer: Self-pay | Admitting: Physical Therapy

## 2017-03-31 DIAGNOSIS — G710 Muscular dystrophy: Secondary | ICD-10-CM | POA: Insufficient documentation

## 2017-03-31 DIAGNOSIS — R293 Abnormal posture: Secondary | ICD-10-CM | POA: Insufficient documentation

## 2017-03-31 DIAGNOSIS — G7109 Other specified muscular dystrophies: Secondary | ICD-10-CM

## 2017-03-31 DIAGNOSIS — R2689 Other abnormalities of gait and mobility: Secondary | ICD-10-CM | POA: Diagnosis present

## 2017-03-31 DIAGNOSIS — M6281 Muscle weakness (generalized): Secondary | ICD-10-CM | POA: Diagnosis present

## 2017-03-31 NOTE — Therapy (Signed)
[facility] [address] [place], [place], [address] Phone: [phone]   Fax:  [phone]  Pediatric Physical Therapy Treatment  Patient Details  Name: Yvonne Salazar MRN: [id number] Date of Birth: 1991-08-03 No Data Recorded  Encounter date: 03/31/2017      End of Session - 03/31/17 1011    Visit Number [id number]   Number of Visits 60   Date for PT Re-Evaluation 07/20/17   Authorization Type UHC   Authorization Time Period Next recertification due 07/20/17   Authorization - Visit Number 8  2018   Authorization - Number of Visits 60   PT Start Time [phone]   PT Stop Time 0900   PT Time Calculation (min) 37 min   Activity Tolerance Patient tolerated treatment well   Behavior During Therapy Willing to participate      History reviewed. No pertinent past medical history.  History reviewed. No pertinent surgical history.  There were no vitals filed for this visit.                    Pediatric PT Treatment - 03/31/17 0950      Subjective Information   Patient Comments Yvonne Salazar is complaining of menstrual cramps today, and she is going to see Dr. Blair HeysKravitz tomorrow.  [facility]       PT Pediatric Exercise/Activities   Strengthening Activities Retractions with left UE supported, X 5 reps, X 3 trials.       Gross Motor Activities   Bilateral Coordination Bilateral, simultaneous shoulders rolls, X 20 backward, X 10 forward.       Therapeutic Activities   Therapeutic Activity Details Massaged bilateral pecs. suboccipital and deep neck muscles and traps for about 10 minutes.      ROM   Ankle DF Encouraged left LE to be on appropriate left foot plate   Comment Stretched each finger to end-range extension of interphalangeal time with hands supinated, about 30 seconds each ahnd.     Pain   Pain Assessment No/denies pain                  Patient Education - 03/31/17 1010    Education Provided Yes   Education Description Retractions each day, 5 to 15 at a time   Person(s) Educated Patient;Mother   Method Education Verbal explanation;Observed session;Demonstration   Comprehension Returned demonstration          Peds PT Short Term Goals - 03/31/17 1016      PEDS PT  SHORT TERM GOAL #5   Title Yvonne Salazar will receive new w/c with new custom back and tolerate new positioning.   Status On-going     PEDS PT  SHORT TERM GOAL #6   Title Yvonne Salazar will be able to hold left arm off of arm rest for at least 30 seconds to allow for increased bilateral manual use.   Status Achieved     PEDS PT  SHORT TERM GOAL #7   Title Yvonne Salazar will tolerate new adjustment to foot plates that have placed her knees level with her hips without pillow behind back or pillow under right foot without complaints of fatigue or pain.   Status Achieved          Peds PT Long Term Goals - 01/20/17 1138      PEDS PT  LONG TERM GOAL #1   Title Yvonne Salazar will  participate in chair exercises that she can perform independently for cardiovascular health.   Baseline Finding HEP work to carryover at home, and sustain for more than a moment or 2 is challenging; PT would like to continue to develop.   Time 12   Period Months   Status On-going     PEDS PT  LONG TERM GOAL #2   Title Yvonne Salazar will make fully informed decisions about medical equipment and rehab equipment that can help her maximize her function.    Baseline New w/c order initiated today.   Time 12   Period Months   Status New          Plan - 03/31/17 1012    Clinical Impression Statement Yvonne Salazar has significant tightness in neck muscles due to proximal weakness and limited variability of postures.  She benefits from massage and active A/AROM, and participates well with this without increased complaints of pain.     Rehab Potential Good   Clinical impairments affecting rehab potential N/A    PT Frequency Every other week   PT Duration 6 months   PT Treatment/Intervention Therapeutic activities;Therapeutic exercises;Neuromuscular reeducation;Orthotic fitting and training;Wheelchair management;Manual techniques   PT plan Continue PT every other week to increase Yvonne Salazar's comfort and activity level.        Patient will benefit from skilled therapeutic intervention in order to improve the following deficits and impairments:  Decreased ability to maintain good postural alignment, Decreased ability to participate in recreational activities, Decreased function at home and in the community  Visit Diagnosis: Posture abnormality  Muscle weakness (generalized)  Decreased mobility  Ullrich congenital muscular dystrophy (HCC)   Problem List There are no active problems to display for this patient.   Crewe Heathman 03/31/2017, 10:20 AM  [facility] [address] [place], [place], [address] Phone: [phone]   Fax:  [phone]  Name: Yvonne Salazar MRN: [id number] Date of Birth: July 12, 1991   Everardo Beals, PT 03/31/17 10:20 AM Phone: [phone] Fax: [phone]

## 2017-04-01 DIAGNOSIS — G710 Muscular dystrophy: Secondary | ICD-10-CM | POA: Diagnosis not present

## 2017-04-01 DIAGNOSIS — J984 Other disorders of lung: Secondary | ICD-10-CM | POA: Diagnosis not present

## 2017-04-01 DIAGNOSIS — G709 Myoneural disorder, unspecified: Secondary | ICD-10-CM | POA: Diagnosis not present

## 2017-04-14 ENCOUNTER — Encounter: Payer: Self-pay | Admitting: Physical Therapy

## 2017-04-14 ENCOUNTER — Ambulatory Visit: Payer: 59 | Admitting: Physical Therapy

## 2017-04-14 DIAGNOSIS — G7109 Other specified muscular dystrophies: Secondary | ICD-10-CM

## 2017-04-14 DIAGNOSIS — R293 Abnormal posture: Secondary | ICD-10-CM

## 2017-04-14 DIAGNOSIS — M6281 Muscle weakness (generalized): Secondary | ICD-10-CM

## 2017-04-14 NOTE — Therapy (Signed)
[facility] [address] [place], [place], [address] Phone: [phone]   Fax:  [phone]  Pediatric Physical Therapy Treatment  Patient Details  Name: Harvin HazelKatherine G Thelen MRN: [id number] Date of Birth: [birth year] No Data Recorded  Encounter date: 04/14/2017      End of Session - 04/14/17 0920    Visit Number [id number]   Number of Visits 60   Date for PT Re-Evaluation 07/20/17   Authorization Type [employer]   Authorization Time Period Next recertification due 07/20/17   Authorization - Visit Number 9  2018   Authorization - Number of Visits 60   PT Start Time 0819   PT Stop Time 0902   PT Time Calculation (min) 43 min   Activity Tolerance Patient tolerated treatment well   Behavior During Therapy Willing to participate      History reviewed. No pertinent past medical history.  History reviewed. No pertinent surgical history.  There were no vitals filed for this visit.                    Pediatric PT Treatment - 04/14/17 0910      Pain Assessment   Pain Assessment No/denies pain     Subjective Information   Patient Comments Yvonne Salazar was disappointed because her massage therapist had to cancel this week, so she asked that PT work on her neck a little today.  She reports a positive visit with Dr. Blair HeysKravitz.  She is awaiting a GI appointment.  She also is trying to find an appointment with a neurologist.       PT Pediatric Exercise/Activities   Strengthening Activities Retractions with left UE supported X 20 total.  Raising arm overhead (shoulder abd) X 5 each, A/AROM with left, independent on right, X 4 sets each side performed seperately.  Finally, performed bilateral (with left arm supported) X 5 total.     Therapeutic Activities   Therapeutic Activity Details Massaged traps, scapular musculature, and splenius capitus, then followed by passive movement of bilateral shoulders, altnerantely, in and out of  depression/elevation/protraction/retraction.  Manual work performed 10 minutes total.       ROM   Comment Attempted to stretch trunk out of left lateral flexion by lifting left arm overhead and shifting left thoracic trunk toward midline, held about 1 minute.                 Patient Education - 04/14/17 0919    Education Provided Yes   Education Description Continue with retractions each day, same frequency as before, at least 5 at a time.   Person(s) Educated Patient;Mother   Method Education Verbal explanation;Observed session;Demonstration   Comprehension Returned demonstration          Peds PT Short Term Goals - 03/31/17 1016      PEDS PT  SHORT TERM GOAL #5   Title Yvonne Salazar will receive new w/c with new custom back and tolerate new positioning.   Status On-going     PEDS PT  SHORT TERM GOAL #6   Title Yvonne Salazar will be able to hold left arm off of arm rest for at least 30 seconds to allow for increased bilateral manual use.   Status Achieved     PEDS PT  SHORT TERM GOAL #7   Title Yvonne Salazar will tolerate new adjustment to foot plates that have placed her knees level with her hips without pillow behind back or pillow under right foot without complaints of fatigue or  pain.   Status Achieved          Peds PT Long Term Goals - 01/20/17 1138      PEDS PT  LONG TERM GOAL #1   Title Yvonne Dire will participate in chair exercises that she can perform independently for cardiovascular health.   Baseline Finding HEP work to carryover at home, and sustain for more than a moment or 2 is challenging; PT would like to continue to develop.   Time 12   Period Months   Status On-going     PEDS PT  LONG TERM GOAL #2   Title Yvonne Dire will make fully informed decisions about medical equipment and rehab equipment that can help her maximize her function.    Baseline New w/c order initiated today.   Time 12   Period Months   Status New          Plan - 04/14/17 0921    Clinical Impression  Statement Yvonne Dire is tight and weak in her left upper extremity, with limitations in her ability to retract and abduct her left UE.  This also limits mobility of her left lateral trunk and chest wall.     PT plan Continue PT every other week to increase [name] A/ROM and funcitonal abilities.        Patient will benefit from skilled therapeutic intervention in order to improve the following deficits and impairments:  Decreased ability to maintain good postural alignment, Decreased ability to participate in recreational activities, Decreased function at home and in the community  Visit Diagnosis: Posture abnormality  Muscle weakness (generalized)  Ullrich congenital muscular dystrophy (HCC)   Problem List There are no active problems to display for this patient.   Dailey Buccheri 04/14/2017, 9:24 AM  [facility] [address] [place], [place], [address] Phone: [phone]   Fax:  [phone]  Name: MEL TADROS MRN: [id number] Date of Birth: 1991-02-13   Everardo Beals, PT 04/14/17 9:25 AM Phone: [phone] Fax: [phone]

## 2017-04-15 DIAGNOSIS — G710 Muscular dystrophy: Secondary | ICD-10-CM | POA: Diagnosis not present

## 2017-04-23 DIAGNOSIS — J9690 Respiratory failure, unspecified, unspecified whether with hypoxia or hypercapnia: Secondary | ICD-10-CM | POA: Diagnosis not present

## 2017-04-27 DIAGNOSIS — J9690 Respiratory failure, unspecified, unspecified whether with hypoxia or hypercapnia: Secondary | ICD-10-CM | POA: Diagnosis not present

## 2017-04-28 ENCOUNTER — Encounter: Payer: Self-pay | Admitting: Physical Therapy

## 2017-04-28 ENCOUNTER — Ambulatory Visit: Payer: 59 | Admitting: Physical Therapy

## 2017-04-28 DIAGNOSIS — G7109 Other specified muscular dystrophies: Secondary | ICD-10-CM

## 2017-04-28 DIAGNOSIS — M6281 Muscle weakness (generalized): Secondary | ICD-10-CM

## 2017-04-28 DIAGNOSIS — R293 Abnormal posture: Secondary | ICD-10-CM

## 2017-04-28 NOTE — Therapy (Signed)
[place], [place], [address] Phone: [phone]   Fax:  [phone]  Pediatric Physical Therapy Treatment  Patient Details  Name: Yvonne Salazar MRN: [id number] Date of Birth: 1991-03-02 No Data Recorded  Encounter date: 04/28/2017      End of Session - 04/28/17 1141    Visit Number 384   Number of Visits 60   Date for PT Re-Evaluation 07/20/17   Authorization Type UHC   Authorization Time Period Next recertification due 08/19/18   Authorization - Visit Number 10  2018   Authorization - Number of Visits 60   PT Start Time 0819   PT Stop Time 0900   PT Time Calculation (min) 41 min   Activity Tolerance Patient tolerated treatment well   Behavior During Therapy Willing to participate      History reviewed. No pertinent past medical history.  History reviewed. No pertinent surgical history.  There were no vitals filed for this visit.                    Pediatric PT Treatment - 04/28/17 1133      Pain Assessment   Pain Assessment No/denies pain     Subjective Information   Patient Comments Yvonne Salazar was "stuck" in her vehicle, despite arriving on time, because a safety feature in her car would not "unlock" her w/c.  PT met her at the car, and worked iwth her in her w/c until mom devised a way to get her out.     PT Pediatric Exercise/Activities   Session Observed by Mother   Strengthening Activities Arm raises, active assist, X 10 each arm, X 3 sets each; seated hip flexion, active assistance, X 10 reps, 2 sets; then combined one more set with arm "hug" to assisted lifted leg, X 10 each side; acitve assisted retractions X 10 reps, 3 sets; ankle pumps/toe lifts, X 10 reps, X 2 sets, then combined one more set with arm lifts; active assisted knee extension from w/c seating X 10 reps, 2 sets each LE.     ROM   Hip Abduction and ER Encouraged K to keep each foot on its own  foot rest (as she typically places left foot on right foot rest), she maintained the entire session after prompted                 Patient Education - 04/28/17 1140    Education Provided Yes   Education Description Daily "full body" movement of arm raises, alternating, combined with ankle pumps/toe lifts X 10 reps, at least 3 sets a day   Person(s) Educated Patient;Mother   Method Education Verbal explanation;Observed session;Demonstration   Comprehension Returned demonstration          Peds PT Short Term Goals - 03/31/17 1016      PEDS PT  SHORT TERM GOAL #5   Title Yvonne Salazar will receive new w/c with new custom back and tolerate new positioning.   Status On-going     PEDS PT  SHORT TERM GOAL #6   Title Yvonne Salazar will be able to hold left arm off of arm rest for at least 30 seconds to allow for increased bilateral manual use.   Status Achieved     PEDS PT  SHORT TERM GOAL #7   Title Yvonne Salazar will tolerate new adjustment to foot plates that have placed her knees level with her hips without pillow behind back or pillow under right foot  without complaints of fatigue or pain.   Status Achieved          Peds PT Long Term Goals - 01/20/17 1138      PEDS PT  LONG TERM GOAL #1   Title Yvonne Salazar will participate in chair exercises that she can perform independently for cardiovascular health.   Baseline Finding HEP work to carryover at home, and sustain for more than a moment or 2 is challenging; PT would like to continue to develop.   Time 12   Period Months   Status On-going     PEDS PT  LONG TERM GOAL #2   Title Yvonne Salazar will make fully informed decisions about medical equipment and rehab equipment that can help her maximize her function.    Baseline New w/c order initiated today.   Time 12   Period Months   Status New          Plan - 04/28/17 1141    Clinical Impression Statement Yvonne Salazar's movements and posture are so limited due to her inability to move out of resting posture.  She  benefits from full body movement within wheelchair for strengtheing, cardiovascular work and for postural flexibility.     PT plan Continue PT every other week to promote increase range of motion and functional activity.        Patient will benefit from skilled therapeutic intervention in order to improve the following deficits and impairments:  Decreased ability to maintain good postural alignment, Decreased ability to participate in recreational activities, Decreased function at home and in the community  Visit Diagnosis: Posture abnormality  Muscle weakness (generalized)  Ullrich congenital muscular dystrophy ([place])   Problem List There are no active problems to display for this patient.   Rondo Spittler 04/28/2017, 11:43 AM  [place], [place], [address] Phone: [phone]   Fax:  [place], PT 04/28/17 11:44 AM Phone: [phone] Fax: [phone]  Name: Yvonne Salazar MRN: [id number] Date of Birth: [birth year]

## 2017-05-12 ENCOUNTER — Ambulatory Visit: Payer: 59 | Attending: Pediatrics | Admitting: Physical Therapy

## 2017-05-12 ENCOUNTER — Encounter: Payer: Self-pay | Admitting: Physical Therapy

## 2017-05-12 DIAGNOSIS — M6281 Muscle weakness (generalized): Secondary | ICD-10-CM

## 2017-05-12 DIAGNOSIS — R293 Abnormal posture: Secondary | ICD-10-CM | POA: Insufficient documentation

## 2017-05-12 DIAGNOSIS — G710 Muscular dystrophy: Secondary | ICD-10-CM | POA: Insufficient documentation

## 2017-05-12 DIAGNOSIS — G7109 Other specified muscular dystrophies: Secondary | ICD-10-CM

## 2017-05-12 NOTE — Therapy (Signed)
[facility] [address] [place], [place], [address] Phone: [phone]   Fax:  [phone]  Pediatric Physical Therapy Treatment  Patient Details  Name: Yvonne Salazar MRN: [id number] Date of Birth: Nov 04, 1991 No Data Recorded  Encounter date: 05/12/2017      End of Session - 05/12/17 1251    Visit Number 385   Number of Visits 60   Date for PT Re-Evaluation 07/20/17   Authorization Type UHC   Authorization Time Period Next recertification due 07/20/17   Authorization - Visit Number 11  2018   Authorization - Number of Visits 60   PT Start Time 0820   PT Stop Time 0900   PT Time Calculation (min) 40 min   Activity Tolerance Patient tolerated treatment well   Behavior During Therapy Willing to participate      History reviewed. No pertinent past medical history.  History reviewed. No pertinent surgical history.  There were no vitals filed for this visit.                    Pediatric PT Treatment - 05/12/17 0820      Pain Assessment   Pain Assessment FLACC  right hip and left shoulder     Pain Comments   Pain Comments some pain with movement in both joints, subsided when returned to resting posture     Subjective Information   Patient Comments Yvonne Salazar is worried about 7 hour car trip to beach later today.  Family frustrated by slow insurance appeal process for new w/c.  Yvonne Salazar reports feeling "crunchy and tight" in her left shoulder and right hip     PT Pediatric Exercise/Activities   Session Observed by Mother   Strengthening Activities Left shoulder ER pulses X 10 reps, 2 sets; PNF D2, independent on right UE X 10; AA/ROM on left     Therapeutic Activities   Therapeutic Activity Details Massage at upper traps bilaterally with more focus on right than left (about 10 minutes total)     ROM   Hip Abduction and ER distracted hips from w/c, sustained about 2 minutes each, and pulsed  about 1 minute each   Comment Overhead opposite arm raise with assisted contralateral leg kick X 5 reps, 2 sets; also required assistance when left UE reaching overhead; stretched left shoulder into flexion, extension, abduction and ER                 Patient Education - 05/12/17 1251    Education Provided Yes   Education Description Left shoulder ER pulses X 10-20 reps each day   Person(s) Educated Patient;Mother   Method Education Verbal explanation;Observed session;Demonstration   Comprehension Returned demonstration          Peds PT Short Term Goals - 05/12/17 1254      PEDS PT  SHORT TERM GOAL #1   Title Yvonne Salazar will be able to supinate forearms to neutral bilaterally and keep fingers extended.    Status Achieved     PEDS PT  SHORT TERM GOAL #2   Title Yvonne Salazar will be able to actively depress her right shoulder while holding neck in neutral to decrease shortening of right SCM.   Status Achieved     PEDS PT  SHORT TERM GOAL #3   Title Yvonne Salazar will perform 3 minutes of chair therapeutic exercise without resting.   Status Achieved     PEDS PT  SHORT TERM GOAL #4   Title  Yvonne Salazar will simulatneously move both hips to end-range hip abduction in custom seat.   Status Achieved     PEDS PT  SHORT TERM GOAL #5   Title Yvonne Salazar will receive new w/c with new custom back and tolerate new positioning.   Status On-going     PEDS PT  SHORT TERM GOAL #6   Title Yvonne Salazar will be able to hold left arm off of arm rest for at least 30 seconds to allow for increased bilateral manual use.   Status Achieved     PEDS PT  SHORT TERM GOAL #7   Title Yvonne Salazar will tolerate new adjustment to foot plates that have placed her knees level with her hips without pillow behind back or pillow under right foot without complaints of fatigue or pain.   Status Achieved          Peds PT Long Term Goals - 01/20/17 1138      PEDS PT  LONG TERM GOAL #1   Title Yvonne Salazar will participate in chair exercises that she can  perform independently for cardiovascular health.   Baseline Finding HEP work to carryover at home, and sustain for more than a moment or 2 is challenging; PT would like to continue to develop.   Time 12   Period Months   Status On-going     PEDS PT  LONG TERM GOAL #2   Title Yvonne Salazar will make fully informed decisions about medical equipment and rehab equipment that can help her maximize her function.    Baseline New w/c order initiated today.   Time 12   Period Months   Status New          Plan - 05/12/17 1252    Clinical Impression Statement Yvonne Salazar's limited posture is leading to pain, tightness, discomfort and risk for further secondary impairments.  She benefits from stretches and manual work to improve flexibility.     PT plan Continue PT every other week to increase Yvonne Salazar's A/ROM and strength.        Patient will benefit from skilled therapeutic intervention in order to improve the following deficits and impairments:  Decreased ability to maintain good postural alignment, Decreased ability to participate in recreational activities, Decreased function at home and in the community  Visit Diagnosis: Posture abnormality  Muscle weakness (generalized)  Ullrich congenital muscular dystrophy (HCC)   Problem List There are no active problems to display for this patient.   Yvonne Salazar 05/12/2017, 12:56 PM  [facility] [address] [name], [place], [address] Phone: [phone]   Fax:  [phone]  Name: Yvonne Salazar MRN: [id number] Date of Birth: 03-Sep-1991   Everardo Beals, PT 05/12/17 12:56 PM Phone: [phone] Fax: [phone]

## 2017-05-16 DIAGNOSIS — G710 Muscular dystrophy: Secondary | ICD-10-CM | POA: Diagnosis not present

## 2017-05-24 DIAGNOSIS — J9690 Respiratory failure, unspecified, unspecified whether with hypoxia or hypercapnia: Secondary | ICD-10-CM | POA: Diagnosis not present

## 2017-05-26 ENCOUNTER — Ambulatory Visit: Payer: 59 | Admitting: Physical Therapy

## 2017-05-26 ENCOUNTER — Encounter: Payer: Self-pay | Admitting: Physical Therapy

## 2017-05-26 DIAGNOSIS — R293 Abnormal posture: Secondary | ICD-10-CM | POA: Diagnosis not present

## 2017-05-26 DIAGNOSIS — M6281 Muscle weakness (generalized): Secondary | ICD-10-CM

## 2017-05-26 DIAGNOSIS — G7109 Other specified muscular dystrophies: Secondary | ICD-10-CM

## 2017-05-26 NOTE — Therapy (Signed)
[facility] [address] [place], [place], [address] Phone: [phone]   Fax:  [phone]  Pediatric Physical Therapy Treatment  Patient Details  Name: Yvonne Salazar MRN: [id number] Date of Birth: 05/30/1991 No Data Recorded  Encounter date: 05/26/2017      End of Session - 05/26/17 1035    Visit Number 386   Number of Visits 60   Date for PT Re-Evaluation 07/20/17   Authorization Type UHC   Authorization Time Period Next recertification due 07/20/17   Authorization - Visit Number 12  2018   Authorization - Number of Visits 60   PT Start Time 0824  arrived late   PT Stop Time 0900   PT Time Calculation (min) 36 min   Activity Tolerance Patient tolerated treatment well   Behavior During Therapy Willing to participate      History reviewed. No pertinent past medical history.  History reviewed. No pertinent surgical history.  There were no vitals filed for this visit.                    Pediatric PT Treatment - 05/26/17 1030      Pain Assessment   Pain Assessment No/denies pain     Subjective Information   Patient Comments [name] has appointment with Harrie Jeans this coming Monday to adjust neck and back of w/c.  She also has upcoming appointment with GI doctor in july.  [name] reports "working it out in hip and shoulder", but she does have concern that her right neck musculature is tighter, and she notes that her elbow does not always hit her right elbow rest.       PT Pediatric Exercise/Activities   Session Observed by Mother   Strengthening Activities Sustained arms off arm rest with minimal assistance from PT (with thumbs up to avoid leaning too heavily on PT); held upright for 1-2 minutes X 3 trials; also perofrmed "running arms" sustained X 20-30 seconds X 4 trials     Therapeutic Activities   Therapeutic Activity Details Massage at traps bilaterally and right scapular  musculature, deep and cross friction, for about 10 minutes total     ROM   Hip Abduction and ER distracted hips and passivley abducted from w/c; K kept feet in foot rests entire session without vc's   Knee Extension(hamstrings) stretched passively from chair                 Patient Education - 05/26/17 1215    Education Provided Yes   Education Description Running arms, daily, striving for longer times   Person(s) Educated Patient;Mother   Method Education Verbal explanation;Observed session;Demonstration   Comprehension Returned demonstration          Peds PT Short Term Goals - 05/26/17 1218      PEDS PT  SHORT TERM GOAL #1   Title [name] will be able to supinate forearms to neutral bilaterally and keep fingers extended.    Status Achieved     PEDS PT  SHORT TERM GOAL #2   Title [name] will be able to actively depress her right shoulder while holding neck in neutral to decrease shortening of right SCM.   Status Achieved     PEDS PT  SHORT TERM GOAL #5   Title [name] will receive new w/c with new custom back and tolerate new positioning.   Baseline Harrie Jeans present on 01/20/17 to initiate new order; appeal has been submitted in early June  Status On-going     PEDS PT  SHORT TERM GOAL #6   Title [name] will be able to hold left arm off of arm rest for at least 30 seconds to allow for increased bilateral manual use.   Status Achieved     PEDS PT  SHORT TERM GOAL #7   Title [name] will tolerate new adjustment to foot plates that have placed her knees level with her hips without pillow behind back or pillow under right foot without complaints of fatigue or pain.   Status Achieved          Peds PT Long Term Goals - 01/20/17 1138      PEDS PT  LONG TERM GOAL #1   Title [name] will participate in chair exercises that she can perform independently for cardiovascular health.   Baseline Finding HEP work to carryover at home, and sustain for more than a moment or 2 is  challenging; PT would like to continue to develop.   Time 12   Period Months   Status On-going     PEDS PT  LONG TERM GOAL #2   Title [name] will make fully informed decisions about medical equipment and rehab equipment that can help her maximize her function.    Baseline New w/c order initiated today.   Time 12   Period Months   Status New          Plan - 05/26/17 1036    Clinical Impression Statement [name] demonstrates tightness in right SCM, trap and scapular muscles.  Her right arm (dominant) and she is losing the ability to rest her arm on the arm rest due to tightness.    PT plan Continue PT every other week (though next appointment canceled due to vacation) to promote increasing A/ROM and postureal control and flexiibiltiy.        Patient will benefit from skilled therapeutic intervention in order to improve the following deficits and impairments:  Decreased ability to maintain good postural alignment, Decreased ability to participate in recreational activities, Decreased function at home and in the community  Visit Diagnosis: Muscle weakness (generalized)  Posture abnormality  Ullrich congenital muscular dystrophy (HCC)   Problem List There are no active problems to display for this patient.   SAWULSKI,CARRIE 05/26/2017, 12:21 PM  [facility] [address] [place], [place], [address] Phone: [phone]   Fax:  [phone]  Name: Harvin HazelKatherine G Fera MRN: [id number] Date of Birth: [birth year]   Everardo Bealsarrie Sawulski, PT 05/26/17 12:21 PM Phone: [phone] Fax: [phone]

## 2017-05-28 DIAGNOSIS — J9690 Respiratory failure, unspecified, unspecified whether with hypoxia or hypercapnia: Secondary | ICD-10-CM | POA: Diagnosis not present

## 2017-06-09 ENCOUNTER — Ambulatory Visit: Payer: 59

## 2017-06-10 DIAGNOSIS — J984 Other disorders of lung: Secondary | ICD-10-CM | POA: Diagnosis not present

## 2017-06-10 DIAGNOSIS — K219 Gastro-esophageal reflux disease without esophagitis: Secondary | ICD-10-CM | POA: Diagnosis not present

## 2017-06-10 DIAGNOSIS — S73002D Unspecified subluxation of left hip, subsequent encounter: Secondary | ICD-10-CM | POA: Diagnosis not present

## 2017-06-10 DIAGNOSIS — G710 Muscular dystrophy: Secondary | ICD-10-CM | POA: Diagnosis not present

## 2017-06-13 DIAGNOSIS — K219 Gastro-esophageal reflux disease without esophagitis: Secondary | ICD-10-CM | POA: Diagnosis not present

## 2017-06-13 DIAGNOSIS — K5900 Constipation, unspecified: Secondary | ICD-10-CM | POA: Diagnosis not present

## 2017-06-15 DIAGNOSIS — G710 Muscular dystrophy: Secondary | ICD-10-CM | POA: Diagnosis not present

## 2017-06-23 ENCOUNTER — Encounter: Payer: Self-pay | Admitting: Physical Therapy

## 2017-06-23 ENCOUNTER — Ambulatory Visit: Payer: 59 | Attending: Pediatrics | Admitting: Physical Therapy

## 2017-06-23 ENCOUNTER — Telehealth: Payer: Self-pay | Admitting: Physical Therapy

## 2017-06-23 DIAGNOSIS — R52 Pain, unspecified: Secondary | ICD-10-CM | POA: Diagnosis present

## 2017-06-23 DIAGNOSIS — R293 Abnormal posture: Secondary | ICD-10-CM | POA: Diagnosis present

## 2017-06-23 DIAGNOSIS — M6281 Muscle weakness (generalized): Secondary | ICD-10-CM | POA: Diagnosis present

## 2017-06-23 DIAGNOSIS — G7109 Other specified muscular dystrophies: Secondary | ICD-10-CM

## 2017-06-23 DIAGNOSIS — G710 Muscular dystrophy: Secondary | ICD-10-CM | POA: Insufficient documentation

## 2017-06-23 DIAGNOSIS — J9690 Respiratory failure, unspecified, unspecified whether with hypoxia or hypercapnia: Secondary | ICD-10-CM | POA: Diagnosis not present

## 2017-06-23 NOTE — Telephone Encounter (Signed)
This PT left a note at PCP's office requesting a script for PT evaluate and treat pain related to Waunita SchoonerUlrich MD and education with a TENS unit. The script could be sent to this office at [phone].  This is requested so [name] can see a PT more familiar with this modality on our adult ortho team.

## 2017-06-23 NOTE — Therapy (Signed)
[facility] [address] [place], [place], [address] Phone: [phone]   Fax:  [phone]  Pediatric Physical Therapy Treatment  Patient Details  Name: Yvonne Salazar MRN: [id number] Date of Birth: 1991-04-26 No Data Recorded  Encounter date: 06/23/2017      End of Session - 06/23/17 1013    Visit Number [id number]   Number of Visits 60   Date for PT Re-Evaluation 07/20/17   Authorization Type U[employer]   Authorization Time Period Next recertification due 07/20/17   Authorization - Visit Number 13  2018   Authorization - Number of Visits 60   PT Start Time 0819   PT Stop Time 0900   PT Time Calculation (min) 41 min   Equipment Utilized During Treatment --  hot pack   Activity Tolerance Patient tolerated treatment well   Behavior During Therapy Willing to participate      History reviewed. No pertinent past medical history.  History reviewed. No pertinent surgical history.  There were no vitals filed for this visit.                    Pediatric PT Treatment - 06/23/17 0956      Pain Assessment   Pain Assessment No/denies pain     Pain Comments   Pain Comments Denies pain during the session, but has radiating pain frequently down her left leg.     Subjective Information   Patient Comments Yvonne Salazar had an appointment with [facility] since her last visit here.  She reports that she did not get much new for recommendations other than a high calorie shake from nutritionist because she has lost a few pounds, and neurologist would like her to see a "hip preservation orthopedist", so they are working on that appointment.       PT Pediatric Exercise/Activities   Session Observed by Mother   Strengthening Activities A/AROM to lift arms (shoulder flexion) X 5 each LE seperately and then 10 simultaneously.   Self-care Used heating pad in lap over quads and hip flexors     Activities Performed   Core  Stability Details Asked K to tilt back in w/c and change back angle each fifteen minutes of session to change pressure points in w/c.     Therapeutic Activities   Therapeutic Activity Details Massaged along scalenes and traps, more on rigth than left     ROM   Hip Abduction and ER distracted left hip; abducted hips within whellchair   Knee Extension(hamstrings) stretched passively from chair   Comment Passively moved neck out of left lateral flexion, and extended/flexed each elbow X 10                 Patient Education - 06/23/17 1012    Education Provided Yes   Education Description encouraged Yvonne Salazar to perform hourly w/c adjustments by changing angle of seat, at least for 30-60 seconds at a time.  Also, Yvonne Salazar has asked for a TENS unit to address left LE radiating pain, and PT will call PCP abou this.   Person(s) Educated Patient;Mother   Method Education Verbal explanation;Observed session   Comprehension Verbalized understanding          Peds PT Short Term Goals - 06/23/17 1015      PEDS PT  SHORT TERM GOAL #1   Title Yvonne Salazar will be able to supinate forearms to neutral bilaterally and keep fingers extended.    Status Achieved  PEDS PT  SHORT TERM GOAL #2   Title Yvonne Salazar will achieve -10 degrees of pf bilaterally for appropriate positioning in w/c and to prevent worsening contractures.     Baseline Yvonne Salazar's pf tightness is increasing, and noted to have declined at last [facility] visit (-20 degrees documented).   Time 6   Period Months   Status New     PEDS PT  SHORT TERM GOAL #3   Title Yvonne Salazar will tolerate changes in angle of w/c for up to 15 minutes at a time to change pressure points in w/c and to promote some postural changes within chair.     Baseline Yvonne Salazar reports feeling discomfort with respiratory status and strain on muscles when she moves w/c out of typical resting posture.     Time 6   Status New     PEDS PT  SHORT TERM GOAL #5   Title Yvonne Salazar will receive new w/c  with new custom back and tolerate new positioning.   Baseline Yvonne Salazar present on 01/20/17 to initiate new order; appeal has been submitted in early June and awaiting outcome   Status On-going     PEDS PT  SHORT TERM GOAL #6   Title Yvonne Salazar will be able to hold left arm off of arm rest for at least 30 seconds to allow for increased bilateral manual use.   Status Achieved     PEDS PT  SHORT TERM GOAL #7   Title Yvonne Salazar will tolerate new adjustment to foot plates that have placed her knees level with her hips without pillow behind back or pillow under right foot without complaints of fatigue or pain.   Status Achieved          Peds PT Long Term Goals - 01/20/17 1138      PEDS PT  LONG TERM GOAL #1   Title Yvonne Salazar will participate in chair exercises that she can perform independently for cardiovascular health.   Baseline Finding HEP work to carryover at home, and sustain for more than a moment or 2 is challenging; PT would like to continue to develop.   Time 12   Period Months   Status On-going     PEDS PT  LONG TERM GOAL #2   Title Yvonne Salazar will make fully informed decisions about medical equipment and rehab equipment that can help her maximize her function.    Baseline New w/c order initiated today.   Time 12   Period Months   Status New          Plan - 06/23/17 1014    Clinical Impression Statement Yvonne Salazar continues to have c/o intermittent left LE radiating pain, and family would like to pursue pain relieving options like a TENS unit.  They understand this therapist is not well versed in this, so PT would like to refer to a PT on our outpaitent adult ortho side, and will contact PCP about this.  Yvonne Salazar is so limited in functional position changes throughout the day due to contractures, weakness and pain, and this contributes to worsening contractures.     PT plan Continue PT every other week to increase Yvonne Salazar's comfort, function and promote maintaining ROM in all extremities.         Patient will benefit from skilled therapeutic intervention in order to improve the following deficits and impairments:  Decreased ability to maintain good postural alignment, Decreased ability to participate in recreational activities, Decreased function at home and in the community  Visit Diagnosis: Muscle  weakness (generalized)  Posture abnormality  Pain  Ullrich congenital muscular dystrophy (HCC)   Problem List There are no active problems to display for this patient.   SAWULSKI,CARRIE 06/23/2017, 10:19 AM  [facility] [address] [place], [place], [address] Phone: [phone]   Fax:  [phone]  Name: Yvonne Salazar MRN: [id number] Date of Birth: 11-May-1991   Everardo Bealsarrie Sawulski, PT 06/23/17 10:20 AM Phone: [phone] Fax: [phone]

## 2017-06-27 DIAGNOSIS — J9690 Respiratory failure, unspecified, unspecified whether with hypoxia or hypercapnia: Secondary | ICD-10-CM | POA: Diagnosis not present

## 2017-07-01 DIAGNOSIS — G709 Myoneural disorder, unspecified: Secondary | ICD-10-CM | POA: Diagnosis not present

## 2017-07-01 DIAGNOSIS — G710 Muscular dystrophy: Secondary | ICD-10-CM | POA: Diagnosis not present

## 2017-07-01 DIAGNOSIS — J984 Other disorders of lung: Secondary | ICD-10-CM | POA: Diagnosis not present

## 2017-07-07 ENCOUNTER — Ambulatory Visit: Payer: 59 | Attending: Pediatrics | Admitting: Physical Therapy

## 2017-07-07 ENCOUNTER — Encounter: Payer: Self-pay | Admitting: Physical Therapy

## 2017-07-07 DIAGNOSIS — G8929 Other chronic pain: Secondary | ICD-10-CM | POA: Insufficient documentation

## 2017-07-07 DIAGNOSIS — R293 Abnormal posture: Secondary | ICD-10-CM | POA: Diagnosis present

## 2017-07-07 DIAGNOSIS — G710 Muscular dystrophy: Secondary | ICD-10-CM | POA: Insufficient documentation

## 2017-07-07 DIAGNOSIS — M6281 Muscle weakness (generalized): Secondary | ICD-10-CM | POA: Diagnosis present

## 2017-07-07 DIAGNOSIS — M545 Low back pain: Secondary | ICD-10-CM | POA: Diagnosis present

## 2017-07-07 DIAGNOSIS — G7109 Other specified muscular dystrophies: Secondary | ICD-10-CM

## 2017-07-07 DIAGNOSIS — R2689 Other abnormalities of gait and mobility: Secondary | ICD-10-CM

## 2017-07-07 DIAGNOSIS — R52 Pain, unspecified: Secondary | ICD-10-CM | POA: Insufficient documentation

## 2017-07-07 NOTE — Therapy (Signed)
[facility] [address] [place], [place], [address] Phone: [phone]   Fax:  [phone]  Pediatric Physical Therapy Treatment  Patient Details  Name: Yvonne Salazar MRN: [id number] Date of Birth: [birth year] No Data Recorded  Encounter date: 07/07/2017      End of Session - 07/07/17 1247    Visit Number 388   Number of Visits 60   Date for PT Re-Evaluation 01/07/18   Authorization Type UHC   Authorization Time Period Next recertification due 01/07/18   Authorization - Visit Number 14  2018   Authorization - Number of Visits 60   PT Start Time 0822  arrived late   PT Stop Time 0900   PT Time Calculation (min) 38 min   Activity Tolerance Patient tolerated treatment well   Behavior During Therapy Willing to participate      History reviewed. No pertinent past medical history.  History reviewed. No pertinent surgical history.  There were no vitals filed for this visit.                    Pediatric PT Treatment - 07/07/17 1239      Pain Assessment   Pain Assessment No/denies pain     Subjective Information   Patient Comments Yvonne Salazar has been busy since last appointment, seeing massage therapist, pulmonologist and GI doctor.  No new recommendations at this time.       PT Pediatric Exercise/Activities   Session Observed by Mother   Strengthening Activities A/AROM independent to lift arms overhead; and A/AROM from PT to abduct and retact arms X 10     ROM   Hip Abduction and ER abducted within w/c   Knee Extension(hamstrings) stretched passively from chair   Ankle DF stretched into df and eversion and forefoot abduction, both feet, and more time on right than left   Comment Stretched right scm by moving out of right lateral flexion with hand on head and depressing right shoulder with sustained stretch X5; shoulder shrugs and depression X 10                 Patient Education -  07/07/17 1246    Education Provided Yes   Education Description K is now using a rod at home to elevate/stretch shoulders daily; asked K to depress her shoulders when doing this each day   Person(s) Educated Patient;Mother   Method Education Verbal explanation;Observed session          Peds PT Short Term Goals - 07/07/17 1250      PEDS PT  SHORT TERM GOAL #1   Title Yvonne Salazar will be able to supinate forearms to neutral bilaterally and keep fingers extended.    Status Achieved     PEDS PT  SHORT TERM GOAL #2   Title Yvonne Salazar will achieve -10 degrees of pf bilaterally for appropriate positioning in w/c and to prevent worsening contractures.     Baseline Yvonne Salazar's pf tightness is increasing, and noted to have declined at last [facility] visit (-20 degrees documented).   Time 6   Period Months   Status New   Target Date 01/07/18     PEDS PT  SHORT TERM GOAL #3   Title Yvonne Salazar will tolerate changes in angle of w/c for up to 15 minutes at a time to change pressure points in w/c and to promote some postural changes within chair.     Baseline Yvonne Salazar reports feeling discomfort with respiratory status  and strain on muscles when she moves w/c out of typical resting posture.     Time 6   Period Months   Status New   Target Date 01/07/18     PEDS PT  SHORT TERM GOAL #4   Title Yvonne Salazar will simulatneously move both hips to end-range hip abduction in custom seat.   Status Achieved     PEDS PT  SHORT TERM GOAL #5   Title Yvonne Salazar will receive new w/c with new custom back and tolerate new positioning.   Baseline Harrie Jeans present on 01/20/17 to initiate new order; appeal has been submitted in early June and awaiting outcome   Status On-going     Additional Short Term Goals   Additional Short Term Goals Yes     PEDS PT  SHORT TERM GOAL #6   Title Yvonne Salazar will be able to hold left arm off of arm rest for at least 30 seconds to allow for increased bilateral manual use.   Status Achieved     PEDS PT  SHORT TERM  GOAL #7   Title Yvonne Salazar will tolerate new adjustment to foot plates that have placed her knees level with her hips without pillow behind back or pillow under right foot without complaints of fatigue or pain.   Status Achieved     PEDS PT  SHORT TERM GOAL #8   Title Yvonne Salazar will be educated on activities for core strenghtening that can be done within the confines of her w/c.    Baseline Yvonne Salazar has been working on extremity range of motion and strengthening through HEP, but core exercise routine has not been developed.     Time 6   Period Months   Status New   Target Date 01/07/18          Peds PT Long Term Goals - 07/07/17 1253      PEDS PT  LONG TERM GOAL #1   Title Yvonne Salazar will participate in chair exercises that she can perform independently for cardiovascular health.   Status On-going     PEDS PT  LONG TERM GOAL #2   Title Yvonne Salazar will make fully informed decisions about medical equipment and rehab equipment that can help her maximize her function.    Status On-going          Plan - 07/07/17 1247    Clinical Impression Statement Yvonne Salazar continues to be very limited in all joints, and has very little positional variability throughout the day.  She has a rod for her scoliosis, but has signfiicant tightness of her right SCM.  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  Yvonne Salazar also frequently has some pain, and although she realizes that this is part of her diagnosis, she continues to pursue alternatives to help her manage her pain.     Rehab Potential Good   Clinical impairments affecting rehab potential N/A   PT Frequency Every other week   PT Duration 6 months   PT Treatment/Intervention Therapeutic activities;Therapeutic exercises;Neuromuscular reeducation;Orthotic fitting and training;Wheelchair management;Manual techniques;Self-care and home management   PT plan PT recommends a one time appointment with a  PT who can educate her on TENS for pain management.  This PT would like to continue every other week PT for another six months to promote A/ROM and postural variability, and maximizing functional use of muscles within her available range of motion.  Patient will benefit from skilled therapeutic intervention in order to improve the following deficits and impairments:  Decreased ability to maintain good postural alignment, Decreased ability to participate in recreational activities, Decreased function at home and in the community  Visit Diagnosis: Muscle weakness (generalized)  Posture abnormality  Pain  Ullrich congenital muscular dystrophy (HCC)  Decreased mobility   Problem List There are no active problems to display for this patient.   SAWULSKI,CARRIE 07/07/2017, 12:55 PM  [facility] [address] [place], [place], [address] Phone: [phone]   Fax:  [phone]  Name: Harvin HazelKatherine G Salazar MRN: [id number] Date of Birth: 02/09/1991   Everardo Bealsarrie Sawulski, PT 07/07/17 12:55 PM Phone: [phone] Fax: [phone]

## 2017-07-14 ENCOUNTER — Ambulatory Visit: Payer: 59 | Admitting: Physical Therapy

## 2017-07-14 ENCOUNTER — Encounter: Payer: Self-pay | Admitting: Physical Therapy

## 2017-07-14 DIAGNOSIS — G710 Muscular dystrophy: Secondary | ICD-10-CM | POA: Diagnosis not present

## 2017-07-14 DIAGNOSIS — M6281 Muscle weakness (generalized): Secondary | ICD-10-CM | POA: Diagnosis not present

## 2017-07-14 DIAGNOSIS — M545 Low back pain: Principal | ICD-10-CM

## 2017-07-14 DIAGNOSIS — G8929 Other chronic pain: Secondary | ICD-10-CM

## 2017-07-14 NOTE — Therapy (Signed)
[facility] [address] [place], [place], [address] Phone: [phone]   Fax:  [phone]  Physical Therapy Evaluation  Patient Details  Name: Yvonne HazelKatherine G Weakland MRN: [id number] Date of Birth: Oct 29, 1991 Referring Provider: Mila PalmerSharon Wolters MD  Encounter Date: 07/14/2017      PT End of Session - 07/14/17 0954    Visit Number 1   Number of Visits 2   Date for PT Re-Evaluation 08/25/17   PT Start Time 0805   PT Stop Time 0848   PT Time Calculation (min) 43 min   Activity Tolerance Patient tolerated treatment well;No increased pain   Behavior During Therapy [facility] for tasks assessed/performed      History reviewed. No pertinent past medical history.  Past Surgical History:  Procedure Laterality Date  . FOOT CAPSULE RELEASE W/ PERCUTANEOUS HEEL CORD LENGTHENING, TIBIAL TENDON TRANSFER  2004  . scoliosis repair  2008  . TENNIS ELBOW RELEASE/NIRSCHEL PROCEDURE      There were no vitals filed for this visit.       Subjective Assessment - 07/14/17 0812    Subjective pt is a26 y.o F with hx of muscular dystrophy with CC of pain and spasticity. pain seems to be in the trunk and in to the L foot which she thinks could be a sciatic nerve issue. pt has script for a TENS unit.    Currently in Pain? Yes   Pain Score 4   at worst 6-7/10   Pain Location Back   Pain Descriptors / Indicators Pins and needles;Shooting  tingling   Pain Type Chronic pain   Pain Radiating Towards L foot   Pain Onset More than a month ago   Pain Frequency Intermittent   Aggravating Factors  Stress/ fatigue, wrong position,   Pain Relieving Factors reposition, heating pad, ibuprofen            [facility] PT Assessment - 07/14/17 0818      Assessment   Medical Diagnosis muscular dystrophy   Referring Provider Mila PalmerSharon Wolters MD   Hand Dominance Right   Next MD Visit --  in the Fall for yearly check up   Prior Therapy currently in pediatric PT      Precautions   Precautions None     Restrictions   Weight Bearing Restrictions No     Balance Screen   Has the patient fallen in the past 6 months No   Has the patient had a decrease in activity level because of a fear of falling?  No   Is the patient reluctant to leave their home because of a fear of falling?  No     Home Nurse, mental healthnvironment   Living Environment Private residence   Living Arrangements Parent   Available Help at Discharge Family;Available PRN/intermittently   Type of Home House   Home Access Ramped entrance   Home Layout One level   Home Equipment Wheelchair - power;Shower seat;Other (comment)  cough assist, CPAP     Prior Function   Level of Independence Needs assistance with transfers;Needs assistance with homemaking  total assist with transfers   Meal Prep Total   Vocation Other (comment)  make art     Cognition   Overall Cognitive Status Within Functional Limits for tasks assessed            Objective measurements completed on examination: See above findings.          [facility] Adult PT Treatment/Exercise - 07/14/17 0001  Self-Care   Self-Care Other Self-Care Comments   Other Self-Care Comments  TENS unit education, parameters, pad placement, length of use/ wear  provided medical modalities TENS unit                PT Education - 07/14/17 0842    Education provided Yes   Education Details educated about TENS units, pad placement,  benefits of treatment, parameters, and length of use to avoid accomodation.    Person(s) Educated Patient;Parent(s)   Methods Explanation;Verbal cues;Demonstration;Handout   Comprehension Verbalized understanding;Verbal cues required;Returned demonstration;Tactile cues required          PT Short Term Goals - 07/14/17 1006      PT SHORT TERM GOAL #1   Title STG=LTG           PT Long Term Goals - 07/14/17 1006      PT LONG TERM GOAL #1   Title pt's caregiver to be I with use of TENS unit  utilizing appropriate parameters, length of wear and pad placement while observing precautions and contraindications    Time 6   Period Weeks   Status New   Target Date 08/25/17                Plan - 07/14/17 0956    Clinical Impression Statement pt presents to OPPT with hx of muscular dystrophy and CC of low back pain, and LLE referral to the foot. She is currently being seen by pediatric PT. She provided new script for a home TENS unit. Provided pt with medical modalities TENS unit, educated on parameters, length of wear/ use, pad placement. provided handout with additional educated regarding TENS units with precautions and contraindications. pt reported decreased pain inthe back and in the LLE with TENs unit and pt's parent was able to operate the unit independenlty without issue.    Clinical Presentation Stable   Clinical Decision Making Low   Rehab Potential Good   PT Frequency --  1 x a month PRN for review for TENS    PT Treatment/Interventions Patient/family education;Electrical Stimulation;ADLs/Self Care Home Management   PT Next Visit Plan review TENS PRN   PT Home Exercise Plan TENS unit, and education / handout provided   Consulted and Agree with Plan of Care Patient;Family member/caregiver   Family Member Consulted mom      Patient will benefit from skilled therapeutic intervention in order to improve the following deficits and impairments:  Pain, Improper body mechanics, Increased fascial restricitons, Increased muscle spasms, Decreased endurance, Decreased activity tolerance, Postural dysfunction  Visit Diagnosis: Chronic bilateral low back pain, with sciatica presence unspecified - Plan: PT plan of care cert/re-cert     Problem List There are no active problems to display for this patient.  Lulu Riding PT, DPT, LAT, ATC  07/14/17  10:13 AM      [facility] [address] [place], [place],  [address] Phone: [phone]   Fax:  [phone]  Name: LILLIONA BLAKENEY MRN: [id number] Date of Birth: 17-Dec-1990

## 2017-07-14 NOTE — Patient Instructions (Signed)
TENS stands for Transcutaneous Electrical Nerve Stimulation. In other words, electrical impulses are allowed to pass through the skin in order to excite a nerve.   Purpose and Use of TENS:  TENS is a method used to manage acute and chronic pain without the use of drugs. It has been effective in managing pain associated with surgery, sprains, strains, trauma, rheumatoid arthritis, and neuralgias. It is a non-addictive, low risk, and non-invasive technique used to control pain. It is not, by any means, a curative form of treatment.   How TENS Works:  Most TENS units are a small pocket-sized unit powered by one 9 volt battery. Attached to the outside of the unit are two lead wires where two pins and/or snaps connect on each wire. All units come with a set of four reusable pads or electrodes. These are placed on the skin surrounding the area involved. By inserting the leads into  the pads, the electricity can pass from the unit making the circuit complete.  As the intensity is turned up slowly, the electrical current enters the body from the electrodes through the skin to the surrounding nerve fibers. This triggers the release of hormones from within the body. These hormones contain pain relievers. By increasing the circulation of these hormones, the person's pain may be lessened. It is also believed that the electrical stimulation itself helps to block the pain messages being sent to the brain, thus also decreasing the body's perception of pain.   Hazards:  TENS units are NOT to be used by patients with PACEMAKERS, DEFIBRILLATORS, DIABETIC PUMPS, PREGNANT WOMEN, and patients with SEIZURE DISORDERS.  TENS units are NOT to be used over the heart, throat, brain, or spinal cord.  One of the major side effects from the TENS unit may be skin irritation. Some people may develop a rash if they are sensitive to the materials used in the electrodes or the connecting wires.     Avoid overuse due the body getting  used to the stem making it not as effective over time.   TENS UNIT  This is helpful for muscle pain and spasm.   Search and Purchase a TENS 7000 2nd edition at www.tenspros.com or www.amazon.com  (It should be less than $30)     TENS unit instructions:   Do not shower or bathe with the unit on  Turn the unit off before removing electrodes or batteries  If the electrodes lose stickiness add a drop of water to the electrodes after they are disconnected from the unit and place on plastic sheet. If you continued to have difficulty, call the TENS unit company to purchase more electrodes.  Do not apply lotion on the skin area prior to use. Make sure the skin is clean and dry as this will help prolong the life of the electrodes.  After use, always check skin for unusual red areas, rash or other skin difficulties. If there are any skin problems, does not apply electrodes to the same area.  Never remove the electrodes from the unit by pulling the wires.  Do not use the TENS unit or electrodes other than as directed.  Do not change electrode placement without consulting your therapist or physician.  Keep 2 fingers with between each electrode.  

## 2017-07-16 DIAGNOSIS — G710 Muscular dystrophy: Secondary | ICD-10-CM | POA: Diagnosis not present

## 2017-07-21 ENCOUNTER — Ambulatory Visit: Payer: 59 | Admitting: Physical Therapy

## 2017-07-21 DIAGNOSIS — G7109 Other specified muscular dystrophies: Secondary | ICD-10-CM

## 2017-07-21 DIAGNOSIS — R293 Abnormal posture: Secondary | ICD-10-CM

## 2017-07-21 DIAGNOSIS — M6281 Muscle weakness (generalized): Secondary | ICD-10-CM | POA: Diagnosis not present

## 2017-07-22 ENCOUNTER — Encounter: Payer: Self-pay | Admitting: Physical Therapy

## 2017-07-22 NOTE — Therapy (Signed)
[facility] [address] [place], [place], [address] Phone: [phone]   Fax:  [phone]  Pediatric Physical Therapy Treatment  Patient Details  Name: Yvonne Salazar MRN: [id number] Date of Birth: 09/04/1991 No Data Recorded  Encounter date: 07/21/2017      End of Session - 07/22/17 0708    Visit Number 389   Number of Visits 60   Date for PT Re-Evaluation 01/07/18   Authorization Type [employer]   Authorization Time Period Next recertification due 01/07/18   Authorization - Visit Number 15  2018   Authorization - Number of Visits 60   PT Start Time 0821   PT Stop Time 0900   PT Time Calculation (min) 39 min   Activity Tolerance Patient tolerated treatment well   Behavior During Therapy Willing to participate      History reviewed. No pertinent past medical history.  Past Surgical History:  Procedure Laterality Date  . FOOT CAPSULE RELEASE W/ PERCUTANEOUS HEEL CORD LENGTHENING, TIBIAL TENDON TRANSFER  2004  . scoliosis repair  2008  . TENNIS ELBOW RELEASE/NIRSCHEL PROCEDURE      There were no vitals filed for this visit.                    Pediatric PT Treatment - 07/21/17 0830      Pain Assessment   Pain Assessment No/denies pain     Pain Comments   Pain Comments Denies pain during this session, but frequently has back and left leg pain.     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       PT Pediatric Exercise/Activities   Session Observed by Mother   Strengthening Activities A/AROM of hip flexors with feet held to avoid pf to "lift" LE, X 10 each; A/AROM of both arms into end-range flexion, X 10 each.  Also had Yvonne Salazar clasp hands at midline and rotate through trunk for "twisting" arms X 2 minute trials X 2 reps     Activities Performed   Core Stability Details K tilted back about 40 degrees for 7 minutes X 2 during session.     Therapeutic  Activities   Therapeutic Activity Details Massaged traps and scalenes and rhomboids while chair tilted back     ROM   Knee Extension(hamstrings) stretched passivley from chair   Ankle DF passive ankle rolls performed   Comment Depressed right shoulder and stretched neck out of strong right flexion X 5 minutes                 Patient Education - 07/22/17 0708    Education Provided Yes   Education Description tilt in chair daily, increasing time with goal up to 15 minutes if tolerated   Person(s) Educated Patient;Mother   Method Education Verbal explanation;Observed session   Comprehension Returned demonstration          Peds PT Short Term Goals - 07/07/17 1250      PEDS PT  SHORT TERM GOAL #1   Title Yvonne Salazar will be able to supinate forearms to neutral bilaterally and keep fingers extended.    Status Achieved     PEDS PT  SHORT TERM GOAL #2   Title Yvonne Salazar will achieve -10 degrees of pf bilaterally for appropriate positioning in w/c and to prevent worsening contractures.     Baseline Yvonne Salazar's pf tightness is increasing, and noted to have declined at last [facility] clinic visit (-20  degrees documented).   Time 6   Period Months   Status New   Target Date 01/07/18     PEDS PT  SHORT TERM GOAL #3   Title Yvonne Salazar will tolerate changes in angle of w/c for up to 15 minutes at a time to change pressure points in w/c and to promote some postural changes within chair.     Baseline Yvonne Salazar reports feeling discomfort with respiratory status and strain on muscles when she moves w/c out of typical resting posture.     Time 6   Period Months   Status New   Target Date 01/07/18     PEDS PT  SHORT TERM GOAL #4   Title Yvonne Salazar will simulatneously move both hips to end-range hip abduction in custom seat.   Status Achieved     PEDS PT  SHORT TERM GOAL #5   Title Yvonne Salazar will receive new w/c with new custom back and tolerate new positioning.   Baseline Yvonne Salazar present on 01/20/17 to initiate new  order; appeal has been submitted in early June and awaiting outcome   Status On-going     Additional Short Term Goals   Additional Short Term Goals Yes     PEDS PT  SHORT TERM GOAL #6   Title Yvonne Salazar will be able to hold left arm off of arm rest for at least 30 seconds to allow for increased bilateral manual use.   Status Achieved     PEDS PT  SHORT TERM GOAL #7   Title Yvonne Salazar will tolerate new adjustment to foot plates that have placed her knees level with her hips without pillow behind back or pillow under right foot without complaints of fatigue or pain.   Status Achieved     PEDS PT  SHORT TERM GOAL #8   Title Yvonne Salazar will be educated on activities for core strenghtening that can be done within the confines of her w/c.    Baseline Yvonne Salazar has been working on extremity range of motion and strengthening through HEP, but core exercise routine has not been developed.     Time 6   Period Months   Status New   Target Date 01/07/18          Peds PT Long Term Goals - 07/07/17 1253      PEDS PT  LONG TERM GOAL #1   Title Yvonne Salazar will participate in chair exercises that she can perform independently for cardiovascular health.   Status On-going     PEDS PT  LONG TERM GOAL #2   Title Yvonne Salazar will make fully informed decisions about medical equipment and rehab equipment that can help her maximize her function.    Status On-going          Plan - 07/22/17 0709    Clinical Impression Statement Yvonne Salazar experiences changes in pressure, but also change in breathing pattern, when tilting/reclining in wheelchair.  She is limited in mobility due to prolonged positioning in chair.     PT plan Continue PT every other week to promote increased comfort and function within available range.        Patient will benefit from skilled therapeutic intervention in order to improve the following deficits and impairments:  Decreased ability to maintain good postural alignment, Decreased ability to participate in  recreational activities, Decreased function at home and in the community  Visit Diagnosis: Muscle weakness (generalized)  Posture abnormality  Ullrich congenital muscular dystrophy (HCC)   Problem List There are no active  problems to display for this patient.   Yvonne Salazar 07/22/2017, 7:10 AM  [facility] [address] [place], [place], [address] Phone: [phone]   Fax:  [phone]  Name: Yvonne Salazar MRN: [id number] Date of Birth: 12/24/1990   Everardo Beals, PT 07/22/17 7:10 AM Phone: [phone] Fax: [phone]

## 2017-07-24 DIAGNOSIS — J9690 Respiratory failure, unspecified, unspecified whether with hypoxia or hypercapnia: Secondary | ICD-10-CM | POA: Diagnosis not present

## 2017-07-28 DIAGNOSIS — J9690 Respiratory failure, unspecified, unspecified whether with hypoxia or hypercapnia: Secondary | ICD-10-CM | POA: Diagnosis not present

## 2017-08-04 ENCOUNTER — Ambulatory Visit: Payer: 59 | Attending: Pediatrics | Admitting: Physical Therapy

## 2017-08-16 DIAGNOSIS — G710 Muscular dystrophy: Secondary | ICD-10-CM | POA: Diagnosis not present

## 2017-08-18 ENCOUNTER — Ambulatory Visit: Payer: 59 | Admitting: Physical Therapy

## 2017-08-24 DIAGNOSIS — J9690 Respiratory failure, unspecified, unspecified whether with hypoxia or hypercapnia: Secondary | ICD-10-CM | POA: Diagnosis not present

## 2017-08-28 DIAGNOSIS — J9690 Respiratory failure, unspecified, unspecified whether with hypoxia or hypercapnia: Secondary | ICD-10-CM | POA: Diagnosis not present

## 2017-08-30 DIAGNOSIS — M76892 Other specified enthesopathies of left lower limb, excluding foot: Secondary | ICD-10-CM | POA: Diagnosis not present

## 2017-08-30 DIAGNOSIS — M160 Bilateral primary osteoarthritis of hip: Secondary | ICD-10-CM | POA: Diagnosis not present

## 2017-08-30 DIAGNOSIS — M21859 Other specified acquired deformities of unspecified thigh: Secondary | ICD-10-CM | POA: Diagnosis not present

## 2017-09-01 ENCOUNTER — Encounter: Payer: Self-pay | Admitting: Physical Therapy

## 2017-09-01 ENCOUNTER — Ambulatory Visit: Payer: 59 | Attending: Pediatrics | Admitting: Physical Therapy

## 2017-09-01 DIAGNOSIS — R52 Pain, unspecified: Secondary | ICD-10-CM | POA: Insufficient documentation

## 2017-09-01 DIAGNOSIS — G7109 Other specified muscular dystrophies: Secondary | ICD-10-CM

## 2017-09-01 DIAGNOSIS — R293 Abnormal posture: Secondary | ICD-10-CM | POA: Diagnosis present

## 2017-09-01 DIAGNOSIS — M6281 Muscle weakness (generalized): Secondary | ICD-10-CM

## 2017-09-01 NOTE — Therapy (Signed)
[facility] [place], [place], [address] Phone: [phone]   Fax:  [phone]  Pediatric Physical Therapy Treatment  Patient Details  Name: Yvonne Salazar MRN: [id number] Date of Birth: 07-11-1991 No Data Recorded  Encounter date: 09/01/2017      End of Session - 09/01/17 1249    Visit Number 390   Number of Visits 60   Date for PT Re-Evaluation 01/07/18   Authorization Type UHC   Authorization Time Period Next recertification due 01/07/18   Authorization - Visit Number 16  2018   Authorization - Number of Visits 60   PT Start Time 0825  arrived late due to traffic   PT Stop Time 0901   PT Time Calculation (min) 36 min   Activity Tolerance Patient tolerated treatment well   Behavior During Therapy Willing to participate      History reviewed. No pertinent past medical history.  Past Surgical History:  Procedure Laterality Date  . FOOT CAPSULE RELEASE W/ PERCUTANEOUS HEEL CORD LENGTHENING, TIBIAL TENDON TRANSFER  2004  . scoliosis repair  2008  . TENNIS ELBOW RELEASE/NIRSCHEL PROCEDURE      There were no vitals filed for this visit.                    Pediatric PT Treatment - 09/01/17 1245      Pain Assessment   Pain Assessment No/denies pain     Pain Comments   Pain Comments No pain, but K did report "cool sensation" down her left tricep region when left shoulder extended beyond neutral     Subjective Information   Patient Comments Yvonne Salazar saw Dr. Corky Downs about her hip, which "is not dislocated.  It's my pelvis!"  He recommended neurotin, and possibly consider birth control.     PT Pediatric Exercise/Activities   Session Observed by Mother   Strengthening Activities shoulder shrugs, shimmies, circles about 1 minute each, X 3 trials each.  With chair reclined at least 45 degrees, diaganol crunch through small ROM (just lifting head), X 5 each side, X 2 reps     Gross  Motor Activities   Prone/Extension tilt in chair from 15-45 degrees for 5-15 minutes at a time today     Therapeutic Activities   Therapeutic Activity Details Massaged traps and pecs from w/c     ROM   Knee Extension(hamstrings) stretched passivley from chair   Comment shoulder circles on left to end range; extended both shoulders when reclined                 Patient Education - 09/01/17 1249    Education Provided Yes   Education Description shoulder shaking, shimmies and circles daily; also extend both shoulders to end-range 2-3 times each day   Person(s) Educated Patient;Mother   Method Education Verbal explanation;Observed session   Comprehension Returned demonstration          Peds PT Short Term Goals - 07/07/17 1250      PEDS PT  SHORT TERM GOAL #1   Title Yvonne Salazar will be able to supinate forearms to neutral bilaterally and keep fingers extended.    Status Achieved     PEDS PT  SHORT TERM GOAL #2   Title Yvonne Salazar will achieve -10 degrees of pf bilaterally for appropriate positioning in w/c and to prevent worsening contractures.     Baseline [name] pf tightness is increasing, and noted to have declined at last [facility] clinic visit (-20  degrees documented).   Time 6   Period Months   Status New   Target Date 01/07/18     PEDS PT  SHORT TERM GOAL #3   Title Yvonne Salazar will tolerate changes in angle of w/c for up to 15 minutes at a time to change pressure points in w/c and to promote some postural changes within chair.     Baseline Yvonne Salazar reports feeling discomfort with respiratory status and strain on muscles when she moves w/c out of typical resting posture.     Time 6   Period Months   Status New   Target Date 01/07/18     PEDS PT  SHORT TERM GOAL #4   Title Yvonne Salazar will simulatneously move both hips to end-range hip abduction in custom seat.   Status Achieved     PEDS PT  SHORT TERM GOAL #5   Title Yvonne Salazar will receive new w/c with new custom back and tolerate new positioning.    Baseline Harrie Jeans present on 01/20/17 to initiate new order; appeal has been submitted in early June and awaiting outcome   Status On-going     Additional Short Term Goals   Additional Short Term Goals Yes     PEDS PT  SHORT TERM GOAL #6   Title Yvonne Salazar will be able to hold left arm off of arm rest for at least 30 seconds to allow for increased bilateral manual use.   Status Achieved     PEDS PT  SHORT TERM GOAL #7   Title Yvonne Salazar will tolerate new adjustment to foot plates that have placed her knees level with her hips without pillow behind back or pillow under right foot without complaints of fatigue or pain.   Status Achieved     PEDS PT  SHORT TERM GOAL #8   Title Yvonne Salazar will be educated on activities for core strenghtening that can be done within the confines of her w/c.    Baseline Yvonne Salazar has been working on extremity range of motion and strengthening through HEP, but core exercise routine has not been developed.     Time 6   Period Months   Status New   Target Date 01/07/18          Peds PT Long Term Goals - 07/07/17 1253      PEDS PT  LONG TERM GOAL #1   Title Yvonne Salazar will participate in chair exercises that she can perform independently for cardiovascular health.   Status On-going     PEDS PT  LONG TERM GOAL #2   Title Yvonne Salazar will make fully informed decisions about medical equipment and rehab equipment that can help her maximize her function.    Status On-going          Plan - 09/01/17 1250    Clinical Impression Statement Yvonne Salazar has some sensory changes when shoulders extended, and has significant tightness at bilateral pecs with limited shoulder joint ROM.  She can activate abs when moving extremities in chair, especially when reclined.     PT plan Continue PT every other week to promote increased endurance, strength and functional mobility.        Patient will benefit from skilled therapeutic intervention in order to improve the following deficits and impairments:   Decreased ability to maintain good postural alignment, Decreased ability to participate in recreational activities, Decreased function at home and in the community  Visit Diagnosis: Muscle weakness (generalized)  Posture abnormality  Ullrich congenital muscular dystrophy   Problem List  There are no active problems to display for this patient.   SAWULSKI,CARRIE 09/01/2017, 12:52 PM  [facility] [address] [place], [place], [address] Phone: [phone]   Fax:  [phone]  Name: ZANAYAH SHADOWENS MRN: [id number] Date of Birth: Sep 08, 1991   Everardo Beals, PT 09/01/17 12:52 PM Phone: [phone] Fax: [phone]

## 2017-09-06 DIAGNOSIS — N946 Dysmenorrhea, unspecified: Secondary | ICD-10-CM | POA: Diagnosis not present

## 2017-09-06 DIAGNOSIS — M5417 Radiculopathy, lumbosacral region: Secondary | ICD-10-CM | POA: Diagnosis not present

## 2017-09-06 DIAGNOSIS — Z23 Encounter for immunization: Secondary | ICD-10-CM | POA: Diagnosis not present

## 2017-09-15 ENCOUNTER — Ambulatory Visit: Payer: 59 | Admitting: Physical Therapy

## 2017-09-15 ENCOUNTER — Encounter: Payer: Self-pay | Admitting: Physical Therapy

## 2017-09-15 DIAGNOSIS — M6281 Muscle weakness (generalized): Secondary | ICD-10-CM | POA: Diagnosis not present

## 2017-09-15 DIAGNOSIS — R52 Pain, unspecified: Secondary | ICD-10-CM

## 2017-09-15 DIAGNOSIS — R293 Abnormal posture: Secondary | ICD-10-CM

## 2017-09-15 DIAGNOSIS — G7109 Other specified muscular dystrophies: Secondary | ICD-10-CM

## 2017-09-15 NOTE — Therapy (Signed)
[facility] [place], [place], [address] Phone: [phone]   Fax:  [phone]  Pediatric Physical Therapy Treatment  Patient Details  Name: Yvonne Salazar MRN: [id number] Date of Birth: Apr 16, 1991 No Data Recorded  Encounter date: 09/15/2017      End of Session - 09/15/17 1004    Visit Number 391   Number of Visits 60   Date for PT Re-Evaluation 01/07/18   Authorization Type UHC   Authorization Time Period Next recertification due 01/07/18   Authorization - Visit Number 17  2018   Authorization - Number of Visits 60   PT Start Time 0825   PT Stop Time 0900   PT Time Calculation (min) 35 min   Activity Tolerance Patient tolerated treatment well   Behavior During Therapy Willing to participate      History reviewed. No pertinent past medical history.  Past Surgical History:  Procedure Laterality Date  . FOOT CAPSULE RELEASE W/ PERCUTANEOUS HEEL CORD LENGTHENING, TIBIAL TENDON TRANSFER  2004  . scoliosis repair  2008  . TENNIS ELBOW RELEASE/NIRSCHEL PROCEDURE      There were no vitals filed for this visit.                    Pediatric PT Treatment - 09/15/17 0001      Pain Assessment   Pain Assessment FLACC   Pain Score 2    Pain Type Other (Comment)  with stretch to end-range   Pain Location Neck   Pain Radiating Towards R shoulder   Pain Descriptors / Indicators Discomfort;Tender   Pain Frequency Intermittent     Pain Comments   Pain Comments K wanted right neck stretched out of right lateral flexion, but did experience discomfort at end-ranges.     Subjective Information   Patient Comments K did not have power for five days, and would go to her mother's work to charge batteries for her power chair.  She reports it was a stressful time.     PT Pediatric Exercise/Activities   Session Observed by Mother   Strengthening Activities Assisted crunches with PT  lifting neck and K would "lean forward" or try to get up out of w/c, X 15 reps, X 2 sets (first set with breaks every 5).     Activities Performed   Core Stability Details K tolerated tilting back about 45 degrees today, X 10 minutes total; spending some time reaching forward with hands to tap PT.     Therapeutic Activities   Therapeutic Activity Details Massaged right SCM and bilateral traps in w/c with some stretching of neck out of right lateral flexion and strong depression at right shoulder.     ROM   Ankle DF pf actively, holding X 10 seconds, X 5 each LE; then 20 "stand on toes" reps in w/c; after ther ex, stretched into df bilaterally, with left ankle also focusing on moving out of supination                 Patient Education - 09/15/17 1003    Education Provided Yes   Education Description assisted sitting up can be done from w/c intermittently, encouraged caregiver to support neck   Person(s) Educated Patient;Mother   Method Education Verbal explanation;Observed session   Comprehension Verbalized understanding          Peds PT Short Term Goals - 07/07/17 1250      PEDS PT  SHORT TERM GOAL #1  Title Yvonne Salazar will be able to supinate forearms to neutral bilaterally and keep fingers extended.    Status Achieved     PEDS PT  SHORT TERM GOAL #2   Title Yvonne Salazar will achieve -10 degrees of pf bilaterally for appropriate positioning in w/c and to prevent worsening contractures.     Baseline Yvonne Salazar's pf tightness is increasing, and noted to have declined at last [facility] clinic visit (-20 degrees documented).   Time 6   Period Months   Status New   Target Date 01/07/18     PEDS PT  SHORT TERM GOAL #3   Title Yvonne Salazar will tolerate changes in angle of w/c for up to 15 minutes at a time to change pressure points in w/c and to promote some postural changes within chair.     Baseline Yvonne Salazar reports feeling discomfort with respiratory status and strain on muscles when she moves w/c out of  typical resting posture.     Time 6   Period Months   Status New   Target Date 01/07/18     PEDS PT  SHORT TERM GOAL #4   Title Yvonne Salazar will simulatneously move both hips to end-range hip abduction in custom seat.   Status Achieved     PEDS PT  SHORT TERM GOAL #5   Title Yvonne Salazar will receive new w/c with new custom back and tolerate new positioning.   Baseline Yvonne Salazar present on 01/20/17 to initiate new order; appeal has been submitted in early June and awaiting outcome   Status On-going     Additional Short Term Goals   Additional Short Term Goals Yes     PEDS PT  SHORT TERM GOAL #6   Title Yvonne Salazar will be able to hold left arm off of arm rest for at least 30 seconds to allow for increased bilateral manual use.   Status Achieved     PEDS PT  SHORT TERM GOAL #7   Title Yvonne Salazar will tolerate new adjustment to foot plates that have placed her knees level with her hips without pillow behind back or pillow under right foot without complaints of fatigue or pain.   Status Achieved     PEDS PT  SHORT TERM GOAL #8   Title Yvonne Salazar will be educated on activities for core strenghtening that can be done within the confines of her w/c.    Baseline Yvonne Salazar has been working on extremity range of motion and strengthening through HEP, but core exercise routine has not been developed.     Time 6   Period Months   Status New   Target Date 01/07/18          Peds PT Long Term Goals - 07/07/17 1253      PEDS PT  LONG TERM GOAL #1   Title Yvonne Salazar will participate in chair exercises that she can perform independently for cardiovascular health.   Status On-going     PEDS PT  LONG TERM GOAL #2   Title Yvonne Salazar will make fully informed decisions about medical equipment and rehab equipment that can help her maximize her function.    Status On-going          Plan - 09/15/17 1004    Clinical Impression Statement Yvonne Salazar does have increased tightness in left LE causing her to adduct and pull left foot to right foot  plate, and increased supination of right foot.  She was able to perform some assisted abdominal work when neck was supported, and felt her  lower abs engage when tilted in w/c.     PT plan Continue PT every other week to promote increased core strength, and increased A/ROM throughout.        Patient will benefit from skilled therapeutic intervention in order to improve the following deficits and impairments:  Decreased ability to maintain good postural alignment, Decreased ability to participate in recreational activities, Decreased function at home and in the community  Visit Diagnosis: Muscle weakness (generalized)  Posture abnormality  Ullrich congenital muscular dystrophy  Pain   Problem List There are no active problems to display for this patient.   SAWULSKI,CARRIE 09/15/2017, 10:07 AM  [facility] [address] [place], [place], [address] Phone: [phone]   Fax:  [phone]  Name: Yvonne Salazar MRN: [id number] Date of Birth: 02-Aug-1991   Everardo Beals, PT 09/15/17 10:07 AM Phone: [phone] Fax: [phone]

## 2017-09-23 DIAGNOSIS — J9690 Respiratory failure, unspecified, unspecified whether with hypoxia or hypercapnia: Secondary | ICD-10-CM | POA: Diagnosis not present

## 2017-09-27 DIAGNOSIS — J9690 Respiratory failure, unspecified, unspecified whether with hypoxia or hypercapnia: Secondary | ICD-10-CM | POA: Diagnosis not present

## 2017-09-28 DIAGNOSIS — G712 Congenital myopathies: Secondary | ICD-10-CM | POA: Diagnosis not present

## 2017-09-29 ENCOUNTER — Ambulatory Visit: Payer: 59 | Attending: Pediatrics | Admitting: Physical Therapy

## 2017-09-29 ENCOUNTER — Encounter: Payer: Self-pay | Admitting: Physical Therapy

## 2017-09-29 DIAGNOSIS — R293 Abnormal posture: Secondary | ICD-10-CM | POA: Diagnosis present

## 2017-09-29 DIAGNOSIS — R52 Pain, unspecified: Secondary | ICD-10-CM | POA: Diagnosis present

## 2017-09-29 DIAGNOSIS — G7109 Other specified muscular dystrophies: Secondary | ICD-10-CM | POA: Diagnosis present

## 2017-09-29 DIAGNOSIS — M6281 Muscle weakness (generalized): Secondary | ICD-10-CM | POA: Insufficient documentation

## 2017-09-29 NOTE — Therapy (Signed)
[facility] [place], [place], [address] Phone: [phone]   Fax:  [phone]  Pediatric Physical Therapy Treatment  Patient Details  Name: Yvonne Salazar MRN: [id number] Date of Birth: 08/03/1991 No Data Recorded  Encounter date: 09/29/2017      End of Session - 09/29/17 2131    Visit Number 392   Number of Visits 60   Date for PT Re-Evaluation 01/07/18   Authorization Type UHC   Authorization Time Period Next recertification due 01/07/18   Authorization - Visit Number 18  2018   Authorization - Number of Visits 60   PT Start Time 0817   PT Stop Time 0900   PT Time Calculation (min) 43 min   Activity Tolerance Patient tolerated treatment well   Behavior During Therapy Willing to participate      History reviewed. No pertinent past medical history.  Past Surgical History:  Procedure Laterality Date  . FOOT CAPSULE RELEASE W/ PERCUTANEOUS HEEL CORD LENGTHENING, TIBIAL TENDON TRANSFER  2004  . scoliosis repair  2008  . TENNIS ELBOW RELEASE/NIRSCHEL PROCEDURE      There were no vitals filed for this visit.                    Pediatric PT Treatment - 09/29/17 2126      Pain Assessment   Pain Assessment FLACC   Pain Score 1    Pain Type Acute pain   Pain Location Back   Pain Radiating Towards low back on right side   Pain Descriptors / Indicators Discomfort;Sore   Pain Frequency Other (Comment)  fading since yesterday     Pain Comments   Pain Comments K feels sore after sitting with new custom back yesterday for 4.5 hours.     Subjective Information   Patient Comments K is very concerned about new w/c, and thinks her back cannot tolerate support.  "It pushes me out too far, and I'm worried I will get skin breakdown."  K also reports to tolerate new cushion, she had to tilt chair back more, which made her knees not accessible under counters.     PT Pediatric  Exercise/Activities   Session Observed by Father   Strengthening Activities Held UE's off arm rests X 5 trials, intermittent support on left, from 20 seconds to 2.5 minutes.     Therapeutic Activities   Therapeutic Activity Details Massaged low back and right trap.       ROM   Hip Abduction and ER moved feet to appropriate foot plates   Knee Extension(hamstrings) extended in chair                 Patient Education - 09/29/17 2130    Education Provided Yes   Education Description discussed need to get used to support with back support, and need for this support; K plans to contact Harrie Jeans for re-evaluaiton or adjustment if indicated   Person(s) Educated Patient;Father   Method Education Verbal explanation;Observed session   Comprehension Verbalized understanding          Peds PT Short Term Goals - 09/29/17 2133      PEDS PT  SHORT TERM GOAL #2   Title [name] will achieve -10 degrees of pf bilaterally for appropriate positioning in w/c and to prevent worsening contractures.     Status On-going   Target Date 01/07/18     PEDS PT  SHORT TERM GOAL #3   Title [name]  will tolerate changes in angle of w/c for up to 15 minutes at a time to change pressure points in w/c and to promote some postural changes within chair.     Status Achieved     PEDS PT  SHORT TERM GOAL #5   Title [name] will receive new w/c with new custom back and tolerate new positioning.   Baseline Recieved new w/c on 09/28/17, but K is not sure she can tolerate new custom back.     Status On-going   Target Date 01/07/18     PEDS PT  SHORT TERM GOAL #8   Title [name] will be educated on activities for core strenghtening that can be done within the confines of her w/c.    Status On-going   Target Date 01/07/18          Peds PT Long Term Goals - 07/07/17 1253      PEDS PT  LONG TERM GOAL #1   Title [name] will participate in chair exercises that she can perform independently for cardiovascular health.    Status On-going     PEDS PT  LONG TERM GOAL #2   Title [name] will make fully informed decisions about medical equipment and rehab equipment that can help her maximize her function.    Status On-going          Plan - 09/29/17 2131    Clinical Impression Statement [name] has not had support at lumbar spine, especially on her right side in current chair.  The new custom back provides contour and contact, which will take some adjustment to on [name] part, but the support appears necessary.     PT plan Continue PT every other week to increase postural support and control and increase function.  PT will work with patient, family and [employer] to consult regaring custom w/c.        Patient will benefit from skilled therapeutic intervention in order to improve the following deficits and impairments:  Decreased ability to maintain good postural alignment, Decreased ability to participate in recreational activities, Decreased function at home and in the community  Visit Diagnosis: Muscle weakness (generalized)  Posture abnormality  Ullrich congenital muscular dystrophy  Pain   Problem List There are no active problems to display for this patient.   Yvonne Salazar 09/29/2017, 9:36 PM  [facility] [place], [place], [address] Phone: [phone]   Fax:  [phone]  Name: Yvonne Salazar MRN: [id number] Date of Birth: 1991/03/21   Yvonne Salazar, PT 09/29/17 9:36 PM Phone: [phone] Fax: [phone]

## 2017-10-13 ENCOUNTER — Encounter: Payer: Self-pay | Admitting: Physical Therapy

## 2017-10-13 ENCOUNTER — Ambulatory Visit: Payer: 59 | Admitting: Physical Therapy

## 2017-10-13 DIAGNOSIS — M6281 Muscle weakness (generalized): Secondary | ICD-10-CM

## 2017-10-13 DIAGNOSIS — J984 Other disorders of lung: Secondary | ICD-10-CM | POA: Diagnosis not present

## 2017-10-13 DIAGNOSIS — R293 Abnormal posture: Secondary | ICD-10-CM

## 2017-10-13 DIAGNOSIS — G709 Myoneural disorder, unspecified: Secondary | ICD-10-CM | POA: Diagnosis not present

## 2017-10-13 DIAGNOSIS — G7109 Other specified muscular dystrophies: Secondary | ICD-10-CM

## 2017-10-13 NOTE — Therapy (Signed)
[facility] [facility] [place], [place], [address] Phone: [phone]   Fax:  [phone]  Pediatric Physical Therapy Treatment  Patient Details  Name: Yvonne Salazar MRN: [id number] Date of Birth: 04-Nov-1991 No Data Recorded  Encounter date: 10/13/2017  End of Session - 10/13/17 1202    Visit Number  393    Number of Visits  60    Date for PT Re-Evaluation  01/07/18    Authorization Type  UHC    Authorization Time Period  Next recertification due 01/07/18    Authorization - Visit Number  19 2018    PT Start Time  0817    PT Stop Time  0900    PT Time Calculation (min)  43 min    Activity Tolerance  Patient tolerated treatment well    Activity Tolerance  Patient tolerated treatment well;No increased pain       History reviewed. No pertinent past medical history.  Past Surgical History:  Procedure Laterality Date  . FOOT CAPSULE RELEASE W/ PERCUTANEOUS HEEL CORD LENGTHENING, TIBIAL TENDON TRANSFER  2004  . scoliosis repair  2008  . TENNIS ELBOW RELEASE/NIRSCHEL PROCEDURE      There were no vitals filed for this visit.                Pediatric PT Treatment - 10/13/17 1158      Pain Assessment   Pain Assessment  No/denies pain      Pain Comments   Pain Comments  Reports soreness in core after carving yesterday, which she had been unable to do for a few weeks as she adjusted to new chair.      Subjective Information   Patient Comments  Yvonne Salazar is using new w/c each day, but cannot travel in it, because she does not have Easy Lock installed yet.  She reports she is getting used to it, after adjustments Yvonne Salazar offered to increase lateral support on left side.        PT Pediatric Exercise/Activities   Session Observed by  Mother    Strengthening Activities  Trunk twists with UE's off armrest X 3 trials from15 to 120 seconds each trial.      Activities Performed   Core Stability Details   Tilted back about 30 degrees and performed unilateral shoulder lifts X 10 each side; and 5 chin curls.      Therapeutic Activities   Therapeutic Activity Details  Massage right SCM and right low back, per Yvonne Salazar's request, deep tissue, X 15 minutes      ROM   Hip Abduction and ER  moved feet to appropriate foot plates    Ankle DF  DF end-range              Patient Education - 10/13/17 1201    Education Provided  Yes    Education Description  continue to utilize new features of new w/c like recline to open up hips and anterior lean to increase workable range of motion and reach    Person(s) Educated  Patient;Mother    Method Education  Verbal explanation;Observed session    Comprehension  Verbalized understanding       Peds PT Short Term Goals - 10/13/17 1204      PEDS PT  SHORT TERM GOAL #2   Title  Yvonne Salazar will achieve -10 degrees of pf bilaterally for appropriate positioning in w/c and to prevent worsening contractures.      Status  On-going  PEDS PT  SHORT TERM GOAL #3   Title  Yvonne Salazar will tolerate changes in angle of w/c for up to 15 minutes at a time to change pressure points in w/c and to promote some postural changes within chair.      Status  Achieved      PEDS PT  SHORT TERM GOAL #5   Title  Yvonne Salazar will receive new w/c with new custom back and tolerate new positioning.    Baseline  Adjustments last week allow her to sit in it all day, but not yet travelling in new chair.      Status  On-going       Peds PT Long Term Goals - 07/07/17 1253      PEDS PT  LONG TERM GOAL #1   Title  Yvonne Salazar will participate in chair exercises that she can perform independently for cardiovascular health.    Status  On-going      PEDS PT  LONG TERM GOAL #2   Title  Yvonne Salazar will make fully informed decisions about medical equipment and rehab equipment that can help her maximize her function.     Status  On-going       Plan - 10/13/17 1203    Clinical Impression Statement  Yvonne Salazar is  increasing tolerance in new w/c.  She is trying new features, like anterior lean.  She cannot get under the same size table as before, so that limits accessibility.      PT plan  Continue PT every other week to increase postural control and limit progressive contractures expected with her disease pathology.         Patient will benefit from skilled therapeutic intervention in order to improve the following deficits and impairments:  Decreased ability to maintain good postural alignment, Decreased ability to participate in recreational activities, Decreased function at home and in the community  Visit Diagnosis: Muscle weakness (generalized)  Posture abnormality  Ullrich congenital muscular dystrophy   Problem List There are no active problems to display for this patient.   Sukhman Martine 10/13/2017, 12:06 PM  [facility] [place], [place], [address] Phone: [phone]   Fax:  [phone]  Name: Yvonne Salazar MRN: [id number] Date of Birth: [birth year]   Everardo Bealsarrie Jenniferlynn Saad, PT 10/13/17 12:06 PM Phone: [phone] Fax: [phone]

## 2017-10-24 DIAGNOSIS — J9690 Respiratory failure, unspecified, unspecified whether with hypoxia or hypercapnia: Secondary | ICD-10-CM | POA: Diagnosis not present

## 2017-10-27 ENCOUNTER — Encounter: Payer: Self-pay | Admitting: Physical Therapy

## 2017-10-27 ENCOUNTER — Ambulatory Visit: Payer: 59 | Admitting: Physical Therapy

## 2017-10-27 DIAGNOSIS — G7109 Other specified muscular dystrophies: Secondary | ICD-10-CM

## 2017-10-27 DIAGNOSIS — M6281 Muscle weakness (generalized): Secondary | ICD-10-CM | POA: Diagnosis not present

## 2017-10-27 DIAGNOSIS — R52 Pain, unspecified: Secondary | ICD-10-CM

## 2017-10-27 DIAGNOSIS — R293 Abnormal posture: Secondary | ICD-10-CM

## 2017-10-27 NOTE — Therapy (Signed)
[facility] [place], [place], [address] Phone: [phone]   Fax:  [phone]  Pediatric Physical Therapy Treatment  Patient Details  Name: Yvonne Salazar MRN: [id number] Date of Birth: 1991/05/28 No Data Recorded  Encounter date: 10/27/2017  End of Session - 10/27/17 1102    Visit Number  394    Number of Visits  60    Date for PT Re-Evaluation  01/07/18    Authorization Type  UHC    Authorization Time Period  Next recertification due 01/07/18    Authorization - Visit Number  20 2018    PT Start Time  0820    PT Stop Time  0900    PT Time Calculation (min)  40 min    Activity Tolerance  Patient tolerated treatment well    Activity Tolerance  Patient tolerated treatment well       History reviewed. No pertinent past medical history.  Past Surgical History:  Procedure Laterality Date  . FOOT CAPSULE RELEASE W/ PERCUTANEOUS HEEL CORD LENGTHENING, TIBIAL TENDON TRANSFER  2004  . scoliosis repair  2008  . TENNIS ELBOW RELEASE/NIRSCHEL PROCEDURE      There were no vitals filed for this visit.                Pediatric PT Treatment - 10/27/17 0820      Pain Comments   Pain Comments  No pain today, but discomfort in right SCM with stretch out of resting right SCM      Subjective Information   Patient Comments  K in new w/c, and is "mostly" loving it.  Contacted Harrie JeansBrandon Sisk of [employer] to adjust head rest and left UE rest, and he should be able to do that within a day.      PT Pediatric Exercise/Activities   Session Observed by  Mom    Strengthening Activities  Reclined in w/c about 25 degrees, and performed assisted crunch/sit up, X 5 reps, X 4 trials, with left hand held and head supported.      Self-care  massaged through traps and interscapular musculature bilaterally and right SCM      ROM   Hip Abduction and ER  reclined in w/c to open up hip angle and stayed for 25 minute     Knee Extension(hamstrings)  dropped foot rest height to allow more passive movement and extension through knee    Comment  left shoulder moved passively through protraction/retraction; and shrugs (passive); also stretched right scm out of right lateral flexion to more of a neutral posture              Patient Education - 10/27/17 1101    Education Provided  Yes    Education Description  recline at least daily and try to increase angle to tolerance    Person(s) Educated  Patient;Mother    Method Education  Verbal explanation;Observed session    Comprehension  Returned demonstration       Peds PT Short Term Goals - 10/13/17 1204      PEDS PT  SHORT TERM GOAL #2   Title  Yvonne Salazar will achieve -10 degrees of pf bilaterally for appropriate positioning in w/c and to prevent worsening contractures.      Status  On-going      PEDS PT  SHORT TERM GOAL #3   Title  Yvonne Salazar will tolerate changes in angle of w/c for up to 15 minutes at a time to change pressure  points in w/c and to promote some postural changes within chair.      Status  Achieved      PEDS PT  SHORT TERM GOAL #5   Title  Yvonne Salazar will receive new w/c with new custom back and tolerate new positioning.    Baseline  Adjustments last week allow her to sit in it all day, but not yet travelling in new chair.      Status  On-going       Peds PT Long Term Goals - 07/07/17 1253      PEDS PT  LONG TERM GOAL #1   Title  Yvonne Salazar will participate in chair exercises that she can perform independently for cardiovascular health.    Status  On-going      PEDS PT  LONG TERM GOAL #2   Title  Yvonne Salazar will make fully informed decisions about medical equipment and rehab equipment that can help her maximize her function.     Status  On-going       Plan - 10/27/17 1102    Clinical Impression Statement  Yvonne Salazar is increasing tolerance in w/c, but is laterally flexed at neck significantly to the right.  Her neck is also mildly hyperextended where her  head rest is currently placed.  She notes that her left shoulder is pulled forward and left arm is not resting appropriately on arm rest.  After adjusting/providing more left lateral trunk support, it appears K needs some adjustments, definitely at head rest as she has significant neck weakness and right lateral flexion tightness.      PT plan  Continue PT every other week to increase strength and improve posture and activity tolerance.         Patient will benefit from skilled therapeutic intervention in order to improve the following deficits and impairments:  Decreased ability to maintain good postural alignment, Decreased ability to participate in recreational activities, Decreased function at home and in the community  Visit Diagnosis: Muscle weakness (generalized)  Posture abnormality  Pain  Ullrich congenital muscular dystrophy   Problem List There are no active problems to display for this patient.   Yvonne Salazar 10/27/2017, 11:05 AM  [facility] [place], [place], [address] Phone: [phone]   Fax:  [phone]  Name: Yvonne Salazar MRN: [id number] Date of Birth: 12-Sep-1991   Everardo Bealsarrie Barnes Florek, PT 10/27/17 11:05 AM Phone: [phone] Fax: [phone]

## 2017-10-28 DIAGNOSIS — J9690 Respiratory failure, unspecified, unspecified whether with hypoxia or hypercapnia: Secondary | ICD-10-CM | POA: Diagnosis not present

## 2017-11-10 ENCOUNTER — Encounter: Payer: Self-pay | Admitting: Physical Therapy

## 2017-11-10 ENCOUNTER — Ambulatory Visit: Payer: 59 | Attending: Pediatrics | Admitting: Physical Therapy

## 2017-11-10 DIAGNOSIS — M6281 Muscle weakness (generalized): Secondary | ICD-10-CM

## 2017-11-10 DIAGNOSIS — R52 Pain, unspecified: Secondary | ICD-10-CM | POA: Insufficient documentation

## 2017-11-10 DIAGNOSIS — R293 Abnormal posture: Secondary | ICD-10-CM

## 2017-11-10 DIAGNOSIS — G7109 Other specified muscular dystrophies: Secondary | ICD-10-CM | POA: Diagnosis present

## 2017-11-10 NOTE — Therapy (Signed)
[facility] [place], [place], [address] Phone: [phone]   Fax:  [phone]  Pediatric Physical Therapy Treatment  Patient Details  Name: Yvonne Salazar MRN: [id number] Date of Birth: May 03, 1991 No Data Recorded  Encounter date: 11/10/2017  End of Session - 11/10/17 0953    Visit Number  395    Number of Visits  60    Date for PT Re-Evaluation  01/07/18    Authorization Type  UHC    Authorization Time Period  Next recertification due 01/07/18    Authorization - Visit Number  21 2018    Authorization - Number of Visits  60    PT Start Time  0820    PT Stop Time  0900    PT Time Calculation (min)  40 min    Activity Tolerance  Patient tolerated treatment well    Behavior During Therapy  Willing to participate       History reviewed. No pertinent past medical history.  Past Surgical History:  Procedure Laterality Date  . FOOT CAPSULE RELEASE W/ PERCUTANEOUS HEEL CORD LENGTHENING, TIBIAL TENDON TRANSFER  2004  . scoliosis repair  2008  . TENNIS ELBOW RELEASE/NIRSCHEL PROCEDURE      There were no vitals filed for this visit.                Pediatric PT Treatment - 11/10/17 0948      Pain Assessment   Pain Assessment  No/denies pain none today, but reports ache deep in right ankle of late      Pain Comments   Pain Comments  K did report pain after using her anterior w/c strap for several hours since last session, "it hurt for days".  Discussed not wearing it for so long without a break.      Subjective Information   Patient Comments  K had head rest adjusted, which she feels is improved, but now her right shoulder does not rest against back of seat. She plans to call Harrie JeansBrandon Sisk of [employer] to discuss possible adjustments.        PT Pediatric Exercise/Activities   Session Observed by  Mom    Strengthening Activities  Active circles of left ankle 20 reps X 2 trials.  Also  A/AROM of bilateral UE's, lifting hands overhead (right arm is neded to assist left arm) X 5 reps.          Gross Motor Activities   Comment  Tilted back as far as 50-60 degrees and sustained about 3 minutes.      Therapeutic Activities   Therapeutic Activity Details  Massaged along bilateral traps, scalenes and toward rhomboids      ROM   Hip Abduction and ER  Reclined in w/c up to 45 degrees, but could not sustain for any length of time; performed X 3 trials    Knee Extension(hamstrings)  Extended from w/c to end-range    Ankle DF  Passive df, focused on right more than left, but both ankles worked              Patient Education - 11/10/17 0952    Education Provided  Yes    Education Description  ankle rolls, daily, and lifting arms up overhead; also reiterated need to recline daily to challenge body    Person(s) Educated  Patient;Mother    Method Education  Verbal explanation;Observed session    Comprehension  Returned demonstration  Peds PT Short Term Goals - 10/13/17 1204      PEDS PT  SHORT TERM GOAL #2   Title  Yvonne Salazar will achieve -10 degrees of pf bilaterally for appropriate positioning in w/c and to prevent worsening contractures.      Status  On-going      PEDS PT  SHORT TERM GOAL #3   Title  Yvonne Salazar will tolerate changes in angle of w/c for up to 15 minutes at a time to change pressure points in w/c and to promote some postural changes within chair.      Status  Achieved      PEDS PT  SHORT TERM GOAL #5   Title  Yvonne Salazar will receive new w/c with new custom back and tolerate new positioning.    Baseline  Adjustments last week allow her to sit in it all day, but not yet travelling in new chair.      Status  On-going       Peds PT Long Term Goals - 07/07/17 1253      PEDS PT  LONG TERM GOAL #1   Title  Yvonne Salazar will participate in chair exercises that she can perform independently for cardiovascular health.    Status  On-going      PEDS PT  LONG TERM GOAL #2    Title  Yvonne Salazar will make fully informed decisions about medical equipment and rehab equipment that can help her maximize her function.     Status  On-going       Plan - 11/10/17 0954    Clinical Impression Statement  Now that Yvonne Salazar's head is better supported in w/c, she has about a 2 inch gap behind her right shoulder due to severe tightness in right SCM.  She also has limited LE movement, and complains of joint stiffness throughout.  She did use her anterior chest strep (tolerates this lower than higher), and reported soreness after use, so she needs to build up a wear schedule gradually.      PT plan  Continue PT every other week (except next session is canceled due to holiday) to increase Yvonne Salazar's function and activity level.         Patient will benefit from skilled therapeutic intervention in order to improve the following deficits and impairments:  Decreased ability to maintain good postural alignment, Decreased ability to participate in recreational activities, Decreased function at home and in the community  Visit Diagnosis: Muscle weakness (generalized)  Posture abnormality  Pain  Ullrich congenital muscular dystrophy   Problem List There are no active problems to display for this patient.   SAWULSKI,CARRIE 11/10/2017, 9:56 AM  [facility] [place], [place], [address] Phone: [phone]   Fax:  [phone]  Name: Yvonne Salazar MRN: [id number] Date of Birth: [birth year]   Everardo Bealsarrie Sawulski, PT 11/10/17 9:56 AM Phone: [phone] Fax: [phone]

## 2017-11-23 DIAGNOSIS — J9690 Respiratory failure, unspecified, unspecified whether with hypoxia or hypercapnia: Secondary | ICD-10-CM | POA: Diagnosis not present

## 2017-11-27 DIAGNOSIS — J9690 Respiratory failure, unspecified, unspecified whether with hypoxia or hypercapnia: Secondary | ICD-10-CM | POA: Diagnosis not present

## 2017-12-08 ENCOUNTER — Ambulatory Visit: Payer: 59 | Attending: Pediatrics | Admitting: Physical Therapy

## 2017-12-08 ENCOUNTER — Encounter: Payer: Self-pay | Admitting: Physical Therapy

## 2017-12-08 DIAGNOSIS — G7109 Other specified muscular dystrophies: Secondary | ICD-10-CM | POA: Insufficient documentation

## 2017-12-08 DIAGNOSIS — R52 Pain, unspecified: Secondary | ICD-10-CM | POA: Diagnosis present

## 2017-12-08 DIAGNOSIS — R293 Abnormal posture: Secondary | ICD-10-CM | POA: Diagnosis present

## 2017-12-08 DIAGNOSIS — M6281 Muscle weakness (generalized): Secondary | ICD-10-CM | POA: Diagnosis present

## 2017-12-08 NOTE — Therapy (Signed)
[facility] [address] [place], [place], [address] Phone: [phone]   Fax:  [phone]  Pediatric Physical Therapy Treatment  Patient Details  Name: Yvonne Salazar MRN: [id number] Date of Birth: 28-Nov-1991 No Data Recorded  Encounter date: 12/08/2017  End of Session - 12/08/17 1006    Visit Number  396    Number of Visits  60    Date for PT Re-Evaluation  01/07/18    Authorization Type  [employer]    Authorization Time Period  Next recertification due 01/07/18    Authorization - Visit Number  1 2019    Authorization - Number of Visits  60    PT Start Time  0817    PT Stop Time  0857    PT Time Calculation (min)  40 min    Activity Tolerance  Patient limited by pain    Activity Tolerance  Patient tolerated treatment well;Patient limited by pain       History reviewed. No pertinent past medical history.  Past Surgical History:  Procedure Laterality Date  . FOOT CAPSULE RELEASE W/ PERCUTANEOUS HEEL CORD LENGTHENING, TIBIAL TENDON TRANSFER  2004  . scoliosis repair  2008  . TENNIS ELBOW RELEASE/NIRSCHEL PROCEDURE      There were no vitals filed for this visit.                Pediatric PT Treatment - 12/08/17 0859      Pain Assessment   Pain Assessment  FLACC    Pain Score  7     Pain Type  Chronic pain;Neuropathic pain;Other (Comment) chronic pain issues were exacerbated by "hormones", per [name]    Pain Location  Pelvis    Pain Radiating Towards  left lower LE    Pain Descriptors / Indicators  Constant;Discomfort;Headache;Nagging;Radiating;Shooting;Tightness    Pain Frequency  Several days a week      Pain Comments   Pain Comments  Yvonne Salazar said, "I'm pitiful.  I am really down."  She reports the past several days have been very bad for feeling tight, head-achey and queasy.        Subjective Information   Patient Comments  Yvonne Salazar brought in by dad who recently had hip replacements, and is recovering,  so they have been watching lots of movies together and resting.       PT Pediatric Exercise/Activities   Session Observed by  Dad    Self-care  showed dad traction and some massage techniques, Yvonne Salazar did not like when dad offered traction ("it gives me a headache")      Gross Motor Activities   Comment  Tilted back about 30 degrees during massage of neck.  Yvonne Salazar also reclined about 10 degrees form neutral to decrease hip flexor angle, and stayed about 5 minutes.      Therapeutic Activities   Therapeutic Activity Details  Massaged posterior scalenes, SCM (on right), splenius capitis and levator muslces.  Offered traction at head.  Encouraged Yvonne Salazar to be reclined while in w/c for massage      ROM   Hip Abduction and ER  Moved left LE to its own foot plate, as she had it swept to the rigt upon arrival    Knee Extension(hamstrings)  Extended, and massaged medial hamstring heads from w/c    Ankle DF  Active pf'ed, and then PT stretched to neutral df    Comment  Rotated neck to right after massage  Patient Education - 12/08/17 1005    Education Provided  Yes    Education Description  keep LE's in their own foot plate to avoid worsening pelvic/hip tightness and asymmetry; dad can offer neck massage if it is relieving; use heat, but cautiously due to skin integrity    Person(s) Educated  Patient;Father    Method Education  Verbal explanation;Observed session;Demonstration    Comprehension  Returned demonstration       Peds PT Short Term Goals - 10/13/17 1204      PEDS PT  SHORT TERM GOAL #2   Title  [name] will achieve -10 degrees of pf bilaterally for appropriate positioning in w/c and to prevent worsening contractures.      Status  On-going      PEDS PT  SHORT TERM GOAL #3   Title  [name] will tolerate changes in angle of w/c for up to 15 minutes at a time to change pressure points in w/c and to promote some postural changes within chair.      Status  Achieved      PEDS PT  SHORT  TERM GOAL #5   Title  [name] will receive new w/c with new custom back and tolerate new positioning.    Baseline  Adjustments last week allow her to sit in it all day, but not yet travelling in new chair.      Status  On-going       Peds PT Long Term Goals - 07/07/17 1253      PEDS PT  LONG TERM GOAL #1   Title  [name] will participate in chair exercises that she can perform independently for cardiovascular health.    Status  On-going      PEDS PT  LONG TERM GOAL #2   Title  [name] will make fully informed decisions about medical equipment and rehab equipment that can help her maximize her function.     Status  On-going       Plan - 12/08/17 1007    Clinical Impression Statement  [name] had significant pain today, and was not very engaged or easy to partipate.  She does continue to keep her left LE in her right foot plate due to pelvic obliquity and hip contractures that lead to wind sweeping to the right.  Yvonne Salazar has significant tension in neck due to tightness and lack of mobility.      PT plan  Continue PT every other week to increase Yvonne Salazar's activity and promote increased quality of life.         Patient will benefit from skilled therapeutic intervention in order to improve the following deficits and impairments:  Decreased ability to maintain good postural alignment, Decreased ability to participate in recreational activities, Decreased function at home and in the community  Visit Diagnosis: Pain  Posture abnormality  Muscle weakness (generalized)  Ullrich congenital muscular dystrophy   Problem List There are no active problems to display for this patient.   Annalisia Ingber 12/08/2017, 10:09 AM  [facility] [address] [place], [place], [address] Phone: [phone]   Fax:  [phone]  Name: Yvonne Salazar MRN: [id number] Date of Birth: 05/03/1991   Everardo Bealsarrie Riyanshi Wahab, PT 12/08/17 10:09 AM Phone:  [phone] Fax: [phone]

## 2017-12-22 ENCOUNTER — Encounter: Payer: Self-pay | Admitting: Physical Therapy

## 2017-12-22 ENCOUNTER — Ambulatory Visit: Payer: 59 | Admitting: Physical Therapy

## 2017-12-22 DIAGNOSIS — M6281 Muscle weakness (generalized): Secondary | ICD-10-CM

## 2017-12-22 DIAGNOSIS — R293 Abnormal posture: Secondary | ICD-10-CM

## 2017-12-22 DIAGNOSIS — R52 Pain, unspecified: Secondary | ICD-10-CM | POA: Diagnosis not present

## 2017-12-22 DIAGNOSIS — G7109 Other specified muscular dystrophies: Secondary | ICD-10-CM

## 2017-12-22 NOTE — Therapy (Signed)
[facility] [address] [place], [place], [address] Phone: [phone]   Fax:  [phone]  Pediatric Physical Therapy Treatment  Patient Details  Name: Yvonne Salazar MRN: [id number] Date of Birth: 1991/03/15 No Data Recorded  Encounter date: 12/22/2017  End of Session - 12/22/17 0959    Visit Number  397    Number of Visits  60    Date for PT Re-Evaluation  01/07/18    Authorization Type  UHC    Authorization Time Period  Next recertification due 01/07/18    Authorization - Visit Number  2 2019    Authorization - Number of Visits  60    PT Start Time  [phone] arrived late    PT Stop Time  0900    PT Time Calculation (min)  37 min    Activity Tolerance  Patient tolerated treatment well    Behavior During Therapy  Willing to participate       History reviewed. No pertinent past medical history.  Past Surgical History:  Procedure Laterality Date  . FOOT CAPSULE RELEASE W/ PERCUTANEOUS HEEL CORD LENGTHENING, TIBIAL TENDON TRANSFER  2004  . scoliosis repair  2008  . TENNIS ELBOW RELEASE/NIRSCHEL PROCEDURE      There were no vitals filed for this visit.                Pediatric PT Treatment - 12/22/17 0955      Pain Assessment   Pain Assessment  No/denies pain reports tightness in bilateral shoulders every morning      Subjective Information   Patient Comments  Yvonne Salazar had to cancel masssage therapy earlier this week, so hoping that PT could massage her neck and shoulders today.      PT Pediatric Exercise/Activities   Session Observed by  Mom    Strengthening Activities  Active shoulder shrugs/depressions X 20 at a time.  Also lifted arm overhead, active assistance, X 5 each UE, 2 sets; more assist required on leeft      Therapeutic Activities   Therapeutic Activity Details  Massaged traps, scalenes and splenius capitus.  Massaged along pecs and deltoids as well, each arm seperately.  Deep massage  performed for about 15 minutes total      ROM   Knee Extension(hamstrings)  Extended from w/c to end-range    Ankle DF  Passively stretched pf'ors to neutral bilaterally.              Patient Education - 12/22/17 0959    Education Provided  Yes    Education Description  asked Yvonne Salazar to lift UE's overhead when in bed (to use gravity for fuller flexion) at least 5 reps a day     Person(s) Educated  Patient;Mother    Method Education  Verbal explanation;Observed session;Demonstration    Comprehension  Verbalized understanding       Peds PT Short Term Goals - 12/22/17 1002      PEDS PT  SHORT TERM GOAL #1   Title  Yvonne Salazar will be able to raise her left arm to at least 60 degrees to increase function and strength in this arm.    Baseline  Yvonne Salazar must use A/AROM to lift left arm with right arm.  She is concerned about increasing tightness in left shoulder.      Time  6    Period  Months    Status  New    Target Date  07/05/18  PEDS PT  SHORT TERM GOAL #2   Title  Yvonne Salazar will achieve -10 degrees of pf bilaterally for appropriate positioning in w/c and to prevent worsening contractures.      Status  Achieved      PEDS PT  SHORT TERM GOAL #3   Title  Yvonne Salazar will tolerate changes in angle of w/c for up to 15 minutes at a time to change pressure points in w/c and to promote some postural changes within chair.      Status  Achieved      PEDS PT  SHORT TERM GOAL #4   Title  Yvonne Salazar will rotate neck to 45 degrees to the right while in wheelchair to see periphery without turning her chair.    Baseline  Due to tightness in right SCM, Yvonne Salazar often rests with her head laterally flexed to the right at least 45 degrees, limiting right rotation.      Time  6    Period  Months    Status  New    Target Date  07/05/18      PEDS PT  SHORT TERM GOAL #5   Title  Yvonne Salazar will receive new w/c with new custom back and tolerate new positioning.    Status  Achieved      PEDS PT  SHORT TERM GOAL #6   Title  Yvonne Salazar  will be able to kick/lift both feet beyond foot rests of w/c to increase strength and A/ROM in LE's, and not contribute to futher tightening in hamstrings that have led to tightness and low back pain.      Baseline  Yvonne Salazar cannot extend knees enough to clear foot plates.      Time  6    Period  Months    Status  New       Peds PT Long Term Goals - 07/07/17 1253      PEDS PT  LONG TERM GOAL #1   Title  Yvonne Salazar will participate in chair exercises that she can perform independently for cardiovascular health.    Status  On-going      PEDS PT  LONG TERM GOAL #2   Title  Yvonne Salazar will make fully informed decisions about medical equipment and rehab equipment that can help her maximize her function.     Status  On-going       Plan - 12/22/17 1000    Clinical Impression Statement  Yvonne Salazar has significant tightness throughout, more on right side of body than left, but limited range of motion and limited positional variability.  Yvonne Salazar responds positively to manual work and massage to relax musculature, but severe weakness limits exercise and functional activity options.      PT plan  Continue PT every other week to increase [name] functional A/ROM and improve comfort.         Patient will benefit from skilled therapeutic intervention in order to improve the following deficits and impairments:  Decreased ability to maintain good postural alignment, Decreased ability to participate in recreational activities, Decreased function at home and in the community  Visit Diagnosis: Posture abnormality  Muscle weakness (generalized)  Ullrich congenital muscular dystrophy   Problem List There are no active problems to display for this patient.   Yvonne Salazar 12/22/2017, 10:07 AM  [facility] [address] [place], [place], [address] Phone: [phone]   Fax:  [phone]  Name: Yvonne Salazar MRN: [id number] Date of Birth: 02-09-1991    Everardo Beals,  PT 12/22/17 10:07 AM Phone: [phone] Fax: [phone]

## 2017-12-23 DIAGNOSIS — Q279 Congenital malformation of peripheral vascular system, unspecified: Secondary | ICD-10-CM | POA: Diagnosis not present

## 2017-12-24 DIAGNOSIS — J9690 Respiratory failure, unspecified, unspecified whether with hypoxia or hypercapnia: Secondary | ICD-10-CM | POA: Diagnosis not present

## 2017-12-28 DIAGNOSIS — J9690 Respiratory failure, unspecified, unspecified whether with hypoxia or hypercapnia: Secondary | ICD-10-CM | POA: Diagnosis not present

## 2018-01-05 ENCOUNTER — Encounter: Payer: Self-pay | Admitting: Physical Therapy

## 2018-01-05 ENCOUNTER — Ambulatory Visit: Payer: 59 | Attending: Pediatrics | Admitting: Physical Therapy

## 2018-01-05 DIAGNOSIS — R293 Abnormal posture: Secondary | ICD-10-CM | POA: Diagnosis present

## 2018-01-05 DIAGNOSIS — G7109 Other specified muscular dystrophies: Secondary | ICD-10-CM | POA: Insufficient documentation

## 2018-01-05 DIAGNOSIS — M6281 Muscle weakness (generalized): Secondary | ICD-10-CM | POA: Diagnosis present

## 2018-01-05 DIAGNOSIS — R52 Pain, unspecified: Secondary | ICD-10-CM | POA: Insufficient documentation

## 2018-01-05 NOTE — Therapy (Signed)
[facility] [address] [place], [place], [address] Phone: [phone]   Fax:  [phone]  Pediatric Physical Therapy Treatment  Patient Details  Name: Yvonne Salazar MRN: [id number] Date of Birth: [birth year] No Data Recorded  Encounter date: 01/05/2018  End of Session - 01/05/18 0909    Visit Number  398    Number of Visits  60    Date for PT Re-Evaluation  07/05/18    Authorization Type  UHC    Authorization Time Period  Next recertification due 07/05/18    Authorization - Visit Number  3 2019    PT Start Time  0815    PT Stop Time  0900    PT Time Calculation (min)  45 min    Activity Tolerance  Patient tolerated treatment well    Activity Tolerance  Patient tolerated treatment well       History reviewed. No pertinent past medical history.  Past Surgical History:  Procedure Laterality Date  . FOOT CAPSULE RELEASE W/ PERCUTANEOUS HEEL CORD LENGTHENING, TIBIAL TENDON TRANSFER  2004  . scoliosis repair  2008  . TENNIS ELBOW RELEASE/NIRSCHEL PROCEDURE      There were no vitals filed for this visit.                Pediatric PT Treatment - 01/05/18 0904      Pain Assessment   Pain Assessment  No/denies pain      Pain Comments   Pain Comments  No pain today, but Yvonne Salazar did have distracting and radiating numbness/tingling in her left foot/toes      Subjective Information   Patient Comments  Yvonne Salazar has decreased her dose of Estrogen and feels that she is having less nauseau, and is no longer "crying every day".      PT Pediatric Exercise/Activities   Session Observed by  Mom    Strengthening Activities  Protraction/retraction of arms, rest about every 3, total of 20 reps; active assist arm lifting, X 10 each shoulder    Self-care  compression/massage along left leg (thighs and calves)      ROM   Hip Abduction and ER  active hip abduction/adduction with tactile cues at thighs, minimal range of  motion (nearly isolementric)    Knee Extension(hamstrings)  extended knees from 90 degrees of flexion to 60 degrees    Ankle DF  Passively stretched pf'ors to neutral bilaterally.    Comment  Stretched elbows, right elbow moves from 90 degrees of flexion to 95 degrees, left elbow moves from 80-60 degrees              Patient Education - 01/05/18 0908    Education Provided  Yes    Education Description  discussed compression along left thigh, which Yvonne Salazar can do independently and at left calf, which mom can do; also encouraged Yvonne Salazar to practice increase repetitions of protraction/retraction daily    Person(s) Educated  Patient;Mother    Method Education  Verbal explanation;Observed session;Demonstration    Comprehension  Returned demonstration       Peds PT Short Term Goals - 01/05/18 1122      PEDS PT  SHORT TERM GOAL #1   Title  Yvonne Salazar will be able to raise her left arm to at least 60 degrees to increase function and strength in this arm.    Baseline  Yvonne Salazar must use A/AROM to lift left arm with right arm.  She is concerned about increasing tightness in left  shoulder.      Time  6    Period  Months    Status  New    Target Date  07/05/18      PEDS PT  SHORT TERM GOAL #2   Title  Yvonne Salazar will achieve -10 degrees of pf bilaterally for appropriate positioning in w/c and to prevent worsening contractures.      Status  Achieved      PEDS PT  SHORT TERM GOAL #3   Title  Yvonne Salazar will tolerate changes in angle of w/c for up to 15 minutes at a time to change pressure points in w/c and to promote some postural changes within chair.      Status  Achieved      PEDS PT  SHORT TERM GOAL #4   Title  Yvonne Salazar will rotate neck to 45 degrees to the right while in wheelchair to see periphery without turning her chair.    Baseline  Due to tightness in right SCM, Yvonne Salazar often rests with her head laterally flexed to the right at least 45 degrees, limiting right rotation.      Time  6    Period  Months    Status  New     Target Date  07/05/18      PEDS PT  SHORT TERM GOAL #5   Title  Yvonne Salazar will receive new w/c with new custom back and tolerate new positioning.    Status  Achieved      PEDS PT  SHORT TERM GOAL #6   Title  Yvonne Salazar will be able to kick/lift both feet beyond foot rests of w/c to increase strength and A/ROM in LE's, and not contribute to futher tightening in hamstrings that have led to tightness and low back pain.      Baseline  Yvonne Salazar cannot extend knees enough to clear foot plates.      Time  6    Period  Months    Status  New      PEDS PT  SHORT TERM GOAL #8   Title  Yvonne Salazar will be educated on activities for core strenghtening that can be done within the confines of her w/c.     Baseline  Now consistently working on some protraction ther ex for scapular muscles.  Could add to HEP repertoire.      Time  6    Period  Months    Status  On-going    Target Date  07/05/18       Peds PT Long Term Goals - 01/05/18 1123      PEDS PT  LONG TERM GOAL #1   Title  Yvonne Salazar will participate in chair exercises that she can perform independently for cardiovascular health.    Baseline  Finding HEP work to carryover at home, and sustain for more than a moment or 2 is challenging; PT would like to continue to develop.    Time  12    Period  Months    Status  On-going    Target Date  07/05/18      PEDS PT  LONG TERM GOAL #2   Title  Yvonne Salazar will have two week span where her pain does not exceed 3/10.    Baseline  Has had score as high as 7; often 3-4    Time  12    Period  Months    Status  New    Target Date  01/05/19       Plan -  01/05/18 0910    Clinical Impression Statement  Yvonne [name] has adjusted to her new custom back to power w/c with some modifications due to her tigthness in right SCM.  She stays laterally tilted to the right to a minum of 10 degrees at rest.  She continues to have significant tightness throughout, and now can be passively dorsiflexed to neutral.  Knees stay at 90 degrees, but can be  extended so that she achieves - 60 degrees from neutral for terminal knee extension.  Left elbow flexion range is 80-60 degrees of flexion and right elbow fleixon is 90-95 degrees of flexion.  Both shoulders can be passively flexed to 115 degrees, and left shoulder can go to 125 degrees, but Yvonne Salazar cannot lift left arm beyond 30 degrees without support.   She continues to complain of pain in low back, pelvis, left hip and radiating down left LE and at times in shoulders and neck.  She has had pain as high as 7 over this last recert period.    Rehab Potential  Good    Clinical impairments affecting rehab potential  N/A    PT Frequency  Every other week    PT Duration  6 months    PT Treatment/Intervention  Therapeutic activities;Therapeutic exercises;Patient/family education;Wheelchair management;Manual techniques;Instruction proper posture/body mechanics;Self-care and home management    PT plan  Continue PT another six months at frequency of every other week (though next appointment canceled due to PT out of town) to promote improved functional A/ROM and less pain with daily activities through therapeutic activities and manual stretching.         Patient will benefit from skilled therapeutic intervention in order to improve the following deficits and impairments:  Decreased ability to maintain good postural alignment, Decreased ability to participate in recreational activities, Decreased function at home and in the community  Visit Diagnosis: Posture abnormality - Plan: PT plan of care cert/re-cert  Muscle weakness (generalized) - Plan: PT plan of care cert/re-cert  Ullrich congenital muscular dystrophy - Plan: PT plan of care cert/re-cert  Pain - Plan: PT plan of care cert/re-cert   Problem List There are no active problems to display for this patient.   SAWULSKI,CARRIE 01/05/2018, 11:27 AM  [facility] [facility] [address] [place], [place], [address] Phone: [phone]   Fax:  [phone]  Name: Harvin HazelKatherine G Pouliot MRN: [id number] Date of Birth: 15-May-1991   Everardo Bealsarrie Sawulski, PT 01/05/18 11:27 AM Phone: [phone] Fax: [phone]

## 2018-01-19 ENCOUNTER — Ambulatory Visit: Payer: 59 | Admitting: Physical Therapy

## 2018-01-19 DIAGNOSIS — Q825 Congenital non-neoplastic nevus: Secondary | ICD-10-CM | POA: Diagnosis not present

## 2018-01-24 DIAGNOSIS — J9690 Respiratory failure, unspecified, unspecified whether with hypoxia or hypercapnia: Secondary | ICD-10-CM | POA: Diagnosis not present

## 2018-01-26 DIAGNOSIS — J9690 Respiratory failure, unspecified, unspecified whether with hypoxia or hypercapnia: Secondary | ICD-10-CM | POA: Diagnosis not present

## 2018-02-02 ENCOUNTER — Ambulatory Visit: Payer: 59 | Attending: Pediatrics | Admitting: Physical Therapy

## 2018-02-02 ENCOUNTER — Encounter: Payer: Self-pay | Admitting: Physical Therapy

## 2018-02-02 DIAGNOSIS — G7109 Other specified muscular dystrophies: Secondary | ICD-10-CM

## 2018-02-02 DIAGNOSIS — M6281 Muscle weakness (generalized): Secondary | ICD-10-CM | POA: Diagnosis present

## 2018-02-02 DIAGNOSIS — R293 Abnormal posture: Secondary | ICD-10-CM | POA: Diagnosis present

## 2018-02-02 NOTE — Therapy (Signed)
[facility] [address] [place], [place], [address] Phone: [phone]   Fax:  [phone]  Pediatric Physical Therapy Treatment  Patient Details  Name: Yvonne Salazar MRN: [id number] Date of Birth: 01/12/1991 No Data Recorded  Encounter date: 02/02/2018  End of Session - 02/02/18 0924    Visit Number  399    Number of Visits  60    Date for PT Re-Evaluation  07/05/18    Authorization Type  [employer]    Authorization Time Period  Next recertification due 07/05/18    Authorization - Visit Number  4 2019    Authorization - Number of Visits  60    PT Start Time  0820    PT Stop Time  0900    PT Time Calculation (min)  40 min    Activity Tolerance  Patient tolerated treatment well    Behavior During Therapy  Willing to participate       History reviewed. No pertinent past medical history.  Past Surgical History:  Procedure Laterality Date  . FOOT CAPSULE RELEASE W/ PERCUTANEOUS HEEL CORD LENGTHENING, TIBIAL TENDON TRANSFER  2004  . scoliosis repair  2008  . TENNIS ELBOW RELEASE/NIRSCHEL PROCEDURE      There were no vitals filed for this visit.                Pediatric PT Treatment - 02/02/18 0912      Pain Assessment   Pain Assessment  No/denies pain      Pain Comments   Pain Comments  Yvonne Salazar did report left shoulder "hurt" after ther ex, and then she responded that it was just fatigue.      Subjective Information   Patient Comments  Yvonne Salazar has been seeing a specialist to laser her birth mark on her right cheek.  She will go again in June.        PT Pediatric Exercise/Activities   Session Observed by  Dad    Strengthening Activities  Active assisted lifting foot through hip flexion off foot plate on left (X 5 reps, 2 sets) and independent on right, five reps X 2 sets; kicking legs off foot plate (knee extension) X 5 each LE, 2 sets, assist on left and none needed on right; holding UE's off arm rests,  about 10 seconds X 5 reps; also tilted back in w/c and sat forward X 1      Therapeutic Activities   Therapeutic Activity Details  Massaged upper traps, SCM and cervical paraspinals, starting with right and moving to left about 20 minutes.      ROM   Knee Extension(hamstrings)  extended knees from 90 degrees of flexion to 60 degrees    Ankle DF  active toe raises in w/c X 10 each    Comment  intermittently depressed right shoulder for stretch along right SCM; stretched fingers to end range while holding UE's out of protraction at neutral and then moving into some supination, left more than right              Patient Education - 02/02/18 0923    Education Provided  Yes    Education Description  asked Yvonne Salazar to practice daily lifting UE's off of arm rest X 5 reps, 10 seconds each    Person(s) Educated  Patient;Father    Method Education  Verbal explanation;Observed session;Demonstration    Comprehension  Returned demonstration       Peds PT Short Term Goals - 01/05/18  1122      PEDS PT  SHORT TERM GOAL #1   Title  Yvonne Salazar will be able to raise her left arm to at least 60 degrees to increase function and strength in this arm.    Baseline  Yvonne Salazar must use A/AROM to lift left arm with right arm.  She is concerned about increasing tightness in left shoulder.      Time  6    Period  Months    Status  New    Target Date  07/05/18      PEDS PT  SHORT TERM GOAL #2   Title  Yvonne Salazar will achieve -10 degrees of pf bilaterally for appropriate positioning in w/c and to prevent worsening contractures.      Status  Achieved      PEDS PT  SHORT TERM GOAL #3   Title  Yvonne Salazar will tolerate changes in angle of w/c for up to 15 minutes at a time to change pressure points in w/c and to promote some postural changes within chair.      Status  Achieved      PEDS PT  SHORT TERM GOAL #4   Title  Yvonne Salazar will rotate neck to 45 degrees to the right while in wheelchair to see periphery without turning her chair.     Baseline  Due to tightness in right SCM, Yvonne Salazar often rests with her head laterally flexed to the right at least 45 degrees, limiting right rotation.      Time  6    Period  Months    Status  New    Target Date  07/05/18      PEDS PT  SHORT TERM GOAL #5   Title  Yvonne Salazar will receive new w/c with new custom back and tolerate new positioning.    Status  Achieved      PEDS PT  SHORT TERM GOAL #6   Title  Yvonne Salazar will be able to kick/lift both feet beyond foot rests of w/c to increase strength and A/ROM in LE's, and not contribute to futher tightening in hamstrings that have led to tightness and low back pain.      Baseline  Yvonne Salazar cannot extend knees enough to clear foot plates.      Time  6    Period  Months    Status  New      PEDS PT  SHORT TERM GOAL #8   Title  Yvonne Salazar will be educated on activities for core strenghtening that can be done within the confines of her w/c.     Baseline  Now consistently working on some protraction ther ex for scapular muscles.  Could add to HEP repertoire.      Time  6    Period  Months    Status  On-going    Target Date  07/05/18       Peds PT Long Term Goals - 01/05/18 1123      PEDS PT  LONG TERM GOAL #1   Title  Yvonne Salazar will participate in chair exercises that she can perform independently for cardiovascular health.    Baseline  Finding HEP work to carryover at home, and sustain for more than a moment or 2 is challenging; PT would like to continue to develop.    Time  12    Period  Months    Status  On-going    Target Date  07/05/18      PEDS PT  LONG TERM GOAL #  2   Title  Yvonne Salazar will have two week span where her pain does not exceed 3/10.    Baseline  Has had score as high as 7; often 3-4    Time  12    Period  Months    Status  New    Target Date  01/05/19       Plan - 02/02/18 4696    Clinical Impression Statement  Yvonne Salazar continues to have limited active anti-gravity movement within her shortened availalbe ranges, left greater than right.  Her right UE,  especially at shoulder and through neck,  is significantly tighter than her left.      PT plan  Continue PT every other week except next session is canceled for a conflict to maximize A/ROM and functional active control of extremities.         Patient will benefit from skilled therapeutic intervention in order to improve the following deficits and impairments:  Decreased ability to maintain good postural alignment, Decreased ability to participate in recreational activities, Decreased function at home and in the community  Visit Diagnosis: Posture abnormality  Muscle weakness (generalized)  Ullrich congenital muscular dystrophy   Problem List There are no active problems to display for this patient.   Kesean Serviss 02/02/2018, 9:30 AM  [facility] [address] [place], [place], [address] Phone: [phone]   Fax:  [phone]  Name: Yvonne Salazar MRN: [id number] Date of Birth: 1991/04/05   Everardo Beals, PT 02/02/18 9:30 AM Phone: [phone] Fax: [phone]

## 2018-02-16 ENCOUNTER — Ambulatory Visit: Payer: 59 | Admitting: Physical Therapy

## 2018-02-21 DIAGNOSIS — J9690 Respiratory failure, unspecified, unspecified whether with hypoxia or hypercapnia: Secondary | ICD-10-CM | POA: Diagnosis not present

## 2018-02-25 DIAGNOSIS — J9690 Respiratory failure, unspecified, unspecified whether with hypoxia or hypercapnia: Secondary | ICD-10-CM | POA: Diagnosis not present

## 2018-03-02 ENCOUNTER — Ambulatory Visit: Payer: 59 | Attending: Pediatrics | Admitting: Physical Therapy

## 2018-03-02 ENCOUNTER — Encounter: Payer: Self-pay | Admitting: Physical Therapy

## 2018-03-02 DIAGNOSIS — R293 Abnormal posture: Secondary | ICD-10-CM | POA: Diagnosis present

## 2018-03-02 DIAGNOSIS — G7109 Other specified muscular dystrophies: Secondary | ICD-10-CM | POA: Insufficient documentation

## 2018-03-02 DIAGNOSIS — M6281 Muscle weakness (generalized): Secondary | ICD-10-CM | POA: Diagnosis present

## 2018-03-02 NOTE — Therapy (Signed)
[facility] [address] [place], [place], [address] Phone: [phone]   Fax:  [phone]  Pediatric Physical Therapy Treatment  Patient Details  Name: Yvonne Salazar MRN: [id number] Date of Birth: 02/14/1991 No data recorded  Encounter date: 03/02/2018  End of Session - 03/02/18 1206    Visit Number  400    Number of Visits  60    Date for PT Re-Evaluation  07/05/18    Authorization Type  UHC    Authorization Time Period  Next recertification due 07/05/18    Authorization - Visit Number  5 2019    Authorization - Number of Visits  60    PT Start Time  0815    PT Stop Time  0900    PT Time Calculation (min)  45 min    Equipment Utilized During Treatment  -- hot pack    Activity Tolerance  Patient tolerated treatment well    Behavior During Therapy  Willing to participate;Alert and social       History reviewed. No pertinent past medical history.  Past Surgical History:  Procedure Laterality Date  . FOOT CAPSULE RELEASE W/ PERCUTANEOUS HEEL CORD LENGTHENING, TIBIAL TENDON TRANSFER  2004  . scoliosis repair  2008  . TENNIS ELBOW RELEASE/NIRSCHEL PROCEDURE      There were no vitals filed for this visit.                Pediatric PT Treatment - 03/02/18 0830      Pain Comments   Pain Comments  No/denies pain though reports "good pain" with massage to R SCM      Subjective Information   Patient Comments  Yvonne Salazar reports she has a mild headache in her eyes and her stomach is bothering her.  She comes in with a hot pack across her lap      PT Pediatric Exercise/Activities   Session Observed by  Mom    Strengthening Activities  Heel raises x20, toe raises x20, knee extension x20 each, hip abd/add x10, lifting B arms up 5x20 sec      Therapeutic Activities   Therapeutic Activity Details  STM to B upper traps, suboccipitals, SCM, and superior pectoralis  x15 minutes; depressed R shoulder with  massage      ROM   Knee Extension(hamstrings)  manually stretched B hamstrings x8 minutes    Comment  manually rotated L forearm into supination x5 minutes              Patient Education - 03/02/18 1205    Education Provided  Yes    Education Description  Continue with holding arms off lap/chair for at least 5 repetitions morning and night, holding up until she fatigues.      Person(s) Educated  Mother;Patient    Method Education  Verbal explanation;Observed session    Comprehension  Verbalized understanding       Peds PT Short Term Goals - 01/05/18 1122      PEDS PT  SHORT TERM GOAL #1   Title  Yvonne Salazar will be able to raise her left arm to at least 60 degrees to increase function and strength in this arm.    Baseline  Yvonne Salazar must use A/AROM to lift left arm with right arm.  She is concerned about increasing tightness in left shoulder.      Time  6    Period  Months    Status  New    Target Date  07/05/18      PEDS PT  SHORT TERM GOAL #2   Title  Yvonne Salazar will achieve -10 degrees of pf bilaterally for appropriate positioning in w/c and to prevent worsening contractures.      Status  Achieved      PEDS PT  SHORT TERM GOAL #3   Title  Yvonne Salazar will tolerate changes in angle of w/c for up to 15 minutes at a time to change pressure points in w/c and to promote some postural changes within chair.      Status  Achieved      PEDS PT  SHORT TERM GOAL #4   Title  Yvonne Salazar will rotate neck to 45 degrees to the right while in wheelchair to see periphery without turning her chair.    Baseline  Due to tightness in right SCM, K often rests with her head laterally flexed to the right at least 45 degrees, limiting right rotation.      Time  6    Period  Months    Status  New    Target Date  07/05/18      PEDS PT  SHORT TERM GOAL #5   Title  Yvonne Salazar will receive new w/c with new custom back and tolerate new positioning.    Status  Achieved      PEDS PT  SHORT TERM GOAL #6   Title  Yvonne Salazar will be able  to kick/lift both feet beyond foot rests of w/c to increase strength and A/ROM in LE's, and not contribute to futher tightening in hamstrings that have led to tightness and low back pain.      Baseline  Yvonne Salazar cannot extend knees enough to clear foot plates.      Time  6    Period  Months    Status  New      PEDS PT  SHORT TERM GOAL #8   Title  Yvonne Salazar will be educated on activities for core strenghtening that can be done within the confines of her w/c.     Baseline  Now consistently working on some protraction ther ex for scapular muscles.  Could add to HEP repertoire.      Time  6    Period  Months    Status  On-going    Target Date  07/05/18       Peds PT Long Term Goals - 01/05/18 1123      PEDS PT  LONG TERM GOAL #1   Title  Yvonne Salazar will participate in chair exercises that she can perform independently for cardiovascular health.    Baseline  Finding HEP work to carryover at home, and sustain for more than a moment or 2 is challenging; PT would like to continue to develop.    Time  12    Period  Months    Status  On-going    Target Date  07/05/18      PEDS PT  LONG TERM GOAL #2   Title  Yvonne Salazar will have two week span where her pain does not exceed 3/10.    Baseline  Has had score as high as 7; often 3-4    Time  12    Period  Months    Status  New    Target Date  01/05/19       Plan - 03/02/18 1207    Clinical Impression Statement  Yvonne Salazar able to keep arms raised for longer periods of time than last visit, yet still fatigues  quickly.  Slow lowering of arms as she fatigues.  Continues to present with difficulty moving against gravity left more than right.  R SCM significantly tighter than L with multiple trigger points in upper scap and suboccipitals.      PT plan  Continue with PT every other week to maximize function within available range of motion.         Patient will benefit from skilled therapeutic intervention in order to improve the following deficits and impairments:   Decreased ability to maintain good postural alignment, Decreased ability to participate in recreational activities, Decreased function at home and in the community  Visit Diagnosis: Posture abnormality  Muscle weakness (generalized)  Ullrich congenital muscular dystrophy (HCC)   Problem List There are no active problems to display for this patient.   [place], SPT 03/02/2018, 12:12 PM  [facility] [address] [place], [place], [address] Phone: [phone]   Fax:  [phone]  Name: Yvonne Salazar MRN: [id number] Date of Birth: 19-Aug-1991

## 2018-03-16 ENCOUNTER — Encounter: Payer: Self-pay | Admitting: Physical Therapy

## 2018-03-16 ENCOUNTER — Ambulatory Visit: Payer: 59 | Admitting: Physical Therapy

## 2018-03-16 DIAGNOSIS — R293 Abnormal posture: Secondary | ICD-10-CM

## 2018-03-16 DIAGNOSIS — M6281 Muscle weakness (generalized): Secondary | ICD-10-CM

## 2018-03-16 DIAGNOSIS — G7109 Other specified muscular dystrophies: Secondary | ICD-10-CM

## 2018-03-16 NOTE — Therapy (Signed)
[facility] [address] [place], [place], [address] Phone: [phone]   Fax:  [phone]  Pediatric Physical Therapy Treatment  Patient Details  Name: Yvonne Salazar MRN: [id number] Date of Birth: August 16, 1991 No data recorded  Encounter date: 03/16/2018  End of Session - 03/16/18 0840    Visit Number  401    Number of Visits  60    Date for PT Re-Evaluation  07/05/18    Authorization Type  [employer]HC    Authorization Time Period  Next recertification due 07/05/18    Authorization - Visit Number  6 2019    Authorization - Number of Visits  60    PT Start Time  0822    PT Stop Time  0900    PT Time Calculation (min)  38 min    Activity Tolerance  Patient tolerated treatment well    Behavior During Therapy  Willing Salazar participate;Alert and social       History reviewed. No pertinent past medical history.  Past Surgical History:  Procedure Laterality Date  . FOOT CAPSULE RELEASE W/ PERCUTANEOUS HEEL CORD LENGTHENING, TIBIAL TENDON TRANSFER  2004  . scoliosis repair  2008  . TENNIS ELBOW RELEASE/NIRSCHEL PROCEDURE      There were no vitals filed for this visit.                Pediatric PT Treatment - 03/16/18 0827      Pain Comments   Pain Comments  Yvonne Salazar reports she has a mild headache from her Salazar.       Subjective Information   Patient Comments  Yvonne Salazar states that her allergies have been bothering her lately.  She states she followed up with the referral for pschology and should hear back this week.        PT Pediatric Exercise/Activities   Session Observed by  Mom    Strengthening Activities  Shoulder flexion 2x20 then 3x10 AAROM; shoulder abd 2x20; isometric hold shoulder abd 3x20seconds; seated heel raises 2x20; toe raises 2x20;       Therapeutic Activities   Therapeutic Activity Details  STM 15 min Salazar B upper traps, suboccipitals      ROM   Ankle DF  manually stretched into DF with some  STM Salazar B calf muscles    Comment  mearsured PROM: L DF: -5, R-7              Patient Education - 03/16/18 1206    Education Provided  Yes    Education Description  Continue with HEP for upper extremities.    Person(s) Educated  Mother;Patient    Method Education  Verbal explanation;Observed session    Comprehension  Verbalized understanding       Peds PT Short Term Goals - 01/05/18 1122      PEDS PT  SHORT TERM GOAL #1   Title  Yvonne Salazar Salazar be able Salazar raise her left arm Salazar at least 60 degrees Salazar increase function and strength in this arm.    Baseline  Yvonne Salazar must use A/AROM Salazar lift left arm with right arm.  She is concerned about increasing tightness in left shoulder.      Time  6    Period  Months    Status  New    Target Date  07/05/18      PEDS PT  SHORT TERM GOAL #2   Title  Yvonne Salazar Salazar achieve -10 degrees of pf bilaterally for appropriate positioning  in Salazar and Salazar prevent worsening contractures.      Status  Achieved      PEDS PT  SHORT TERM GOAL #3   Title  Yvonne Salazar changes in angle of Salazar for up Salazar 15 minutes at a time Salazar change pressure points in Salazar and Salazar promote some postural changes within chair.      Status  Achieved      PEDS PT  SHORT TERM GOAL #4   Title  Yvonne Salazar Salazar 45 degrees Salazar the right while in wheelchair Salazar see periphery without turning her chair.    Baseline  Due Salazar tightness in right SCM, K often rests with her head laterally flexed Salazar the right at least 45 degrees, limiting right rotation.      Time  6    Period  Months    Status  New    Target Date  07/05/18      PEDS PT  SHORT TERM GOAL #5   Title  Yvonne Salazar with new custom back and Salazar new positioning.    Status  Achieved      PEDS PT  SHORT TERM GOAL #6   Title  Yvonne Salazar kick/lift both feet beyond foot rests of Salazar Salazar increase strength and A/ROM in LE's, and not contribute Salazar futher tightening in hamstrings that have led Salazar tightness and  low back pain.      Baseline  Yvonne Salazar cannot extend knees enough Salazar clear foot plates.      Time  6    Period  Months    Status  New      PEDS PT  SHORT TERM GOAL #8   Title  Yvonne Salazar that can be done within the confines of her Salazar.     Baseline  Now consistently working on some protraction ther ex for scapular muscles.  Could add Salazar HEP repertoire.      Time  6    Period  Months    Status  On-going    Target Date  07/05/18       Peds PT Long Term Goals - 01/05/18 1123      PEDS PT  LONG TERM GOAL #1   Title  Yvonne Salazar participate in chair exercises that she can perform independently for cardiovascular health.    Baseline  Finding HEP work Salazar carryover at home, and sustain for more than a moment or 2 is challenging; PT would like Salazar continue Salazar develop.    Time  12    Period  Months    Status  On-going    Target Date  07/05/18      PEDS PT  LONG TERM GOAL #2   Title  Yvonne Salazar have two week span where her pain does not exceed 3/10.    Baseline  Has had score as high as 7; often 3-4    Time  12    Period  Months    Status  New    Target Date  01/05/19       Plan - 03/16/18 1206    Clinical Impression Statement  Yvonne Salazar able Salazar gain increased ROM of LUE with active assistance of R.  Discussed importance of ROM and strength for carryover and muscle activation in LUE.  Heel raises not as challenging as toe raises and LLE unable Salazar actively gain as much motion.  Multiple trigger points in posterior cervical musculature limiting postural corrections for Yvonne Salazar.     PT plan  Continue with PT every other week Salazar minimize secondary impairments and maximize function in available range of motion.        Patient Salazar benefit from skilled therapeutic intervention in order Salazar improve the following deficits and impairments:  Decreased ability Salazar maintain good postural alignment, Decreased ability Salazar participate in recreational activities, Decreased  function at home and in the community  Visit Diagnosis: Posture abnormality  Muscle weakness (generalized)  Ullrich congenital muscular dystrophy (HCC)   Problem List There are no active problems Salazar display for this patient.   [place], SPT 03/16/2018, 12:11 PM  [facility] [address] [place], [place], [address] Phone: [phone]   Fax:  [phone]  Name: JENNAH SATCHELL MRN: [id number] Date of Birth: 1991/08/01

## 2018-03-24 DIAGNOSIS — J9690 Respiratory failure, unspecified, unspecified whether with hypoxia or hypercapnia: Secondary | ICD-10-CM | POA: Diagnosis not present

## 2018-03-28 DIAGNOSIS — J9690 Respiratory failure, unspecified, unspecified whether with hypoxia or hypercapnia: Secondary | ICD-10-CM | POA: Diagnosis not present

## 2018-03-30 ENCOUNTER — Encounter: Payer: Self-pay | Admitting: Physical Therapy

## 2018-03-30 ENCOUNTER — Ambulatory Visit: Payer: 59 | Attending: Pediatrics | Admitting: Physical Therapy

## 2018-03-30 DIAGNOSIS — R293 Abnormal posture: Secondary | ICD-10-CM

## 2018-03-30 DIAGNOSIS — M6281 Muscle weakness (generalized): Secondary | ICD-10-CM | POA: Diagnosis present

## 2018-03-30 DIAGNOSIS — R52 Pain, unspecified: Secondary | ICD-10-CM | POA: Diagnosis present

## 2018-03-30 DIAGNOSIS — G7109 Other specified muscular dystrophies: Secondary | ICD-10-CM | POA: Diagnosis present

## 2018-03-30 NOTE — Therapy (Signed)
[facility] [address] [place], [place], [address] Phone: [phone]   Fax:  [phone]  Pediatric Physical Therapy Treatment  Patient Details  Name: Yvonne Salazar MRN: [id number] Date of Birth: 1991-05-18 No data recorded  Encounter date: 03/30/2018  End of Session - 03/30/18 0954    Visit Number  402    Number of Visits  60    Date for PT Re-Evaluation  07/05/18    Authorization Type  [employer]    Authorization Time Period  Next recertification due 07/05/18    Authorization - Visit Number  7 [id number]    Authorization - Number of Visits  60    PT Start Time  0821    PT Stop Time  0900    PT Time Calculation (min)  39 min    Activity Tolerance  Patient tolerated treatment well    Behavior During Therapy  Willing to participate       History reviewed. No pertinent past medical history.  Past Surgical History:  Procedure Laterality Date  . FOOT CAPSULE RELEASE W/ PERCUTANEOUS HEEL CORD LENGTHENING, TIBIAL TENDON TRANSFER  2004  . scoliosis repair  2008  . TENNIS ELBOW RELEASE/NIRSCHEL PROCEDURE      There were no vitals filed for this visit.                Pediatric PT Treatment - 03/30/18 0949      Pain Comments   Pain Comments  No specific pain reports, but K does say she is numb, more in left foot/LE than right.  She complains of tightness in her neck.        Subjective Information   Patient Comments  Yvonne Salazar has been approved for SSI.  Yvonne Salazar reports feeling extremely anxious.  she has an appointment with mental health counselor next Thursday, May 9.  Yvonne Salazar also sees neurologist at [facility].        PT Pediatric Exercise/Activities   Session Observed by  Mom    Strengthening Activities  Alternating kicks, A/AROM X 20 each LE; marching in place, A/AROM X 20 each; tilted in w/c at 70's, active assist at shoulders X 5 reps, 4 sets.      Therapeutic Activities   Therapeutic Activity Details   Massaged at upper traps and SCM bilaterally about 10 minutes total      ROM   Ankle DF  passively stretched to neutral bilaterally    Comment  also encouraged right rotaiton of neck, vc's to "lead with chin" and then hold at end-range (about 30 degrees) and held for 10 seconds each, X 3 trials              Patient Education - 03/30/18 0953    Education Provided  Yes    Education Description  Encouraged daily right rotation of neck, lead with chin and hold for 5-20 seconds; also encouraged tilting or recling every 30 minutes or so in w/c for pressure relief    Person(s) Educated  Mother;Patient    Method Education  Verbal explanation;Observed session    Comprehension  Verbalized understanding       Peds PT Short Term Goals - 03/30/18 0956      PEDS PT  SHORT TERM GOAL #1   Title  Yvonne Salazar will be able to raise her left arm to at least 60 degrees to increase function and strength in this arm.    Baseline  Yvonne Salazar can lift left UE briefly off  arm rest, but not to 60 degrees.    Status  On-going    Target Date  07/05/18      PEDS PT  SHORT TERM GOAL #4   Title  Yvonne Salazar will rotate neck to 45 degrees to the right while in wheelchair to see periphery without turning her chair.    Baseline  about 30 degrees    Status  On-going    Target Date  07/05/18      PEDS PT  SHORT TERM GOAL #6   Title  Yvonne Salazar will be able to kick/lift both feet beyond foot rests of w/c to increase strength and A/ROM in LE's, and not contribute to futher tightening in hamstrings that have led to tightness and low back pain.      Baseline  A/AROM on left LE    Status  On-going      PEDS PT  SHORT TERM GOAL #8   Title  Yvonne Salazar will be educated on activities for core strenghtening that can be done within the confines of her w/c.     Status  On-going    Target Date  07/05/18       Peds PT Long Term Goals - 01/05/18 1123      PEDS PT  LONG TERM GOAL #1   Title  Yvonne Salazar will participate in chair exercises that she can  perform independently for cardiovascular health.    Baseline  Finding HEP work to carryover at home, and sustain for more than a moment or 2 is challenging; PT would like to continue to develop.    Time  12    Period  Months    Status  On-going    Target Date  07/05/18      PEDS PT  LONG TERM GOAL #2   Title  Yvonne Salazar will have two week span where her pain does not exceed 3/10.    Baseline  Has had score as high as 7; often 3-4    Time  12    Period  Months    Status  New    Target Date  01/05/19       Plan - 03/30/18 0954    Clinical Impression Statement  Yvonne Salazar rests with her head rotated left about 30 degress and laterally flexed to the right 45 degrees.  Yvonne Salazar was able to raise shoulders off her chair tilted to 70 degrees (or 110 degrees), but required assist at shoulders to sustain.  Minimal active LE movement, left more than right.      PT plan  Continue PT every other week to increase [name] comfort P/ROM and A/ROM and improve postural control while allowing Yvonne Salazar movement out of her resting postures.         Patient will benefit from skilled therapeutic intervention in order to improve the following deficits and impairments:  Decreased ability to maintain good postural alignment, Decreased ability to participate in recreational activities, Decreased function at home and in the community  Visit Diagnosis: Muscle weakness (generalized)  Posture abnormality  Pain  Ullrich congenital muscular dystrophy (HCC)   Problem List There are no active problems to display for this patient.   [name] 03/30/2018, 9:59 AM  [facility] [address] [place], [place], [address] Phone: [phone]   Fax:  [phone]  Name: Yvonne Salazar MRN: [id number] Date of Birth: 1990-12-20   Everardo Beals, PT 03/30/18 9:59 AM Phone: [phone] Fax: [phone]

## 2018-03-31 DIAGNOSIS — G709 Myoneural disorder, unspecified: Secondary | ICD-10-CM | POA: Diagnosis not present

## 2018-03-31 DIAGNOSIS — M79605 Pain in left leg: Secondary | ICD-10-CM | POA: Diagnosis not present

## 2018-03-31 DIAGNOSIS — M21859 Other specified acquired deformities of unspecified thigh: Secondary | ICD-10-CM | POA: Diagnosis not present

## 2018-03-31 DIAGNOSIS — J984 Other disorders of lung: Secondary | ICD-10-CM | POA: Diagnosis not present

## 2018-04-13 ENCOUNTER — Ambulatory Visit: Payer: 59 | Admitting: Physical Therapy

## 2018-04-23 DIAGNOSIS — J9690 Respiratory failure, unspecified, unspecified whether with hypoxia or hypercapnia: Secondary | ICD-10-CM | POA: Diagnosis not present

## 2018-04-27 ENCOUNTER — Ambulatory Visit: Payer: 59 | Admitting: Physical Therapy

## 2018-04-27 ENCOUNTER — Encounter: Payer: Self-pay | Admitting: Physical Therapy

## 2018-04-27 DIAGNOSIS — J9690 Respiratory failure, unspecified, unspecified whether with hypoxia or hypercapnia: Secondary | ICD-10-CM | POA: Diagnosis not present

## 2018-04-27 DIAGNOSIS — G7109 Other specified muscular dystrophies: Secondary | ICD-10-CM

## 2018-04-27 DIAGNOSIS — R293 Abnormal posture: Secondary | ICD-10-CM

## 2018-04-27 DIAGNOSIS — M6281 Muscle weakness (generalized): Secondary | ICD-10-CM | POA: Diagnosis not present

## 2018-04-27 DIAGNOSIS — R52 Pain, unspecified: Secondary | ICD-10-CM

## 2018-04-27 NOTE — Therapy (Signed)
[facility] [address] [place], [place], [address] Phone: [phone]   Fax:  [phone]  Pediatric Physical Therapy Treatment  Patient Details  Name: Yvonne Salazar MRN: [id number] Date of Birth: [birth year] No data recorded  Encounter date: 04/27/2018  End of Session - 04/27/18 1149    Visit Number  403    Number of Visits  60    Date for PT Re-Evaluation  07/05/18    Authorization Type  UHC    Authorization Time Period  Next recertification due 07/05/18    Authorization - Visit Number  8 2019    Authorization - Number of Visits  60    PT Start Time  0817    PT Stop Time  0900    PT Time Calculation (min)  43 min    Activity Tolerance  Patient tolerated treatment well    Behavior During Therapy  Willing to participate;Anxious       History reviewed. No pertinent past medical history.  Past Surgical History:  Procedure Laterality Date  . FOOT CAPSULE RELEASE W/ PERCUTANEOUS HEEL CORD LENGTHENING, TIBIAL TENDON TRANSFER  2004  . scoliosis repair  2008  . TENNIS ELBOW RELEASE/NIRSCHEL PROCEDURE      There were no vitals filed for this visit.                Pediatric PT Treatment - 04/27/18 1143      Pain Comments   Pain Comments  No specific c/o pain, but tingling in left LE, distal.  She also reports feeling general malaise today that she thought could be related to menstrual cycle.      Subjective Information   Patient Comments  Yvonne Salazar was very cold at start of session, and asked for heat pack.  She is very anxious about travelling to [place] next week with mom for MGM's hip replacement, and does not want mom to leave her alone while there.  She does report that she started seeing a counselor to deal with anxiety, and she is very happy with the therapist.        PT Pediatric Exercise/Activities   Session Observed by  Mom    Strengthening Activities  Toe taps in both feet, some assist  on left, X 20; also heel ups or push off with toes on foot rests X 20      Activities Performed   Core Stability Details  Encouraged K to lean forward from back of w/c, and laterally lean (she can lean to right and return to upright, but could not lean over left side) X 10 consecutive.      Therapeutic Activities   Therapeutic Activity Details  Deep tissue massage to latissimus dorsi and lateral obliques and glute meds just above hips; K also was moved passively laterally repeatedly for some lower body movement within w/c      ROM   Comment  Active right rotation of neck with neck extended and then passively laterally flexed toward the left (not even to midline)/out of strong right lateral fleixon              Patient Education - 04/27/18 1148    Education Provided  Yes    Education Description  asked K to allow a  parent to laterally move her left foot to end range of foot plate (as she often rests left foot on right foot plate) for some passive left hip movement out of adduction    Person(s)  Educated  Mother;Patient    Method Education  Verbal explanation;Observed session    Comprehension  Verbalized understanding       Peds PT Short Term Goals - 03/30/18 0956      PEDS PT  SHORT TERM GOAL #1   Title  Yvonne Salazar will be able to raise her left arm to at least 60 degrees to increase function and strength in this arm.    Baseline  Yvonne Salazar can lift left UE briefly off arm rest, but not to 60 degrees.    Status  On-going    Target Date  07/05/18      PEDS PT  SHORT TERM GOAL #4   Title  Yvonne Salazar will rotate neck to 45 degrees to the right while in wheelchair to see periphery without turning her chair.    Baseline  about 30 degrees    Status  On-going    Target Date  07/05/18      PEDS PT  SHORT TERM GOAL #6   Title  Yvonne Salazar will be able to kick/lift both feet beyond foot rests of w/c to increase strength and A/ROM in LE's, and not contribute to futher tightening in hamstrings that have led  to tightness and low back pain.      Baseline  A/AROM on left LE    Status  On-going      PEDS PT  SHORT TERM GOAL #8   Title  Yvonne Salazar will be educated on activities for core strenghtening that can be done within the confines of her w/c.     Status  On-going    Target Date  07/05/18       Peds PT Long Term Goals - 01/05/18 1123      PEDS PT  LONG TERM GOAL #1   Title  Yvonne Salazar will participate in chair exercises that she can perform independently for cardiovascular health.    Baseline  Finding HEP work to carryover at home, and sustain for more than a moment or 2 is challenging; PT would like to continue to develop.    Time  12    Period  Months    Status  On-going    Target Date  07/05/18      PEDS PT  LONG TERM GOAL #2   Title  Yvonne Salazar will have two week span where her pain does not exceed 3/10.    Baseline  Has had score as high as 7; often 3-4    Time  12    Period  Months    Status  New    Target Date  01/05/19       Plan - 04/27/18 1149    Clinical Impression Statement  Yvonne Salazar continues to have some parasthesis along left distal LE.  She has very limited movement within her w/c and already limited range of motion, and this contributes to worsening contractures and discomfort.      PT plan  Continue PT every other week to increase [name] functional mobility and ability to weight shift and move out of restrictive resting postures.         Patient will benefit from skilled therapeutic intervention in order to improve the following deficits and impairments:  Decreased ability to maintain good postural alignment, Decreased ability to participate in recreational activities, Decreased function at home and in the community  Visit Diagnosis: Muscle weakness (generalized)  Posture abnormality  Pain  Ullrich congenital muscular dystrophy (HCC)   Problem List There are no active problems  to display for this patient.   Yvonne Salazar 04/27/2018, 11:52 AM  [facility] [address] [place], [place], [address] Phone: [phone]   Fax:  [phone]  Name: Yvonne Salazar MRN: [id number] Date of Birth: [birth year]   Everardo Beals, PT 04/27/18 11:53 AM Phone: [phone] Fax: [phone]

## 2018-05-11 ENCOUNTER — Ambulatory Visit: Payer: 59 | Attending: Pediatrics | Admitting: Physical Therapy

## 2018-05-24 DIAGNOSIS — J9690 Respiratory failure, unspecified, unspecified whether with hypoxia or hypercapnia: Secondary | ICD-10-CM | POA: Diagnosis not present

## 2018-05-25 ENCOUNTER — Ambulatory Visit: Payer: 59 | Admitting: Physical Therapy

## 2018-05-28 DIAGNOSIS — J9690 Respiratory failure, unspecified, unspecified whether with hypoxia or hypercapnia: Secondary | ICD-10-CM | POA: Diagnosis not present

## 2018-06-08 ENCOUNTER — Encounter: Payer: Self-pay | Admitting: Physical Therapy

## 2018-06-08 ENCOUNTER — Ambulatory Visit: Payer: 59 | Attending: Pediatrics | Admitting: Physical Therapy

## 2018-06-08 DIAGNOSIS — R293 Abnormal posture: Secondary | ICD-10-CM | POA: Diagnosis present

## 2018-06-08 DIAGNOSIS — R52 Pain, unspecified: Secondary | ICD-10-CM | POA: Diagnosis present

## 2018-06-08 DIAGNOSIS — G7109 Other specified muscular dystrophies: Secondary | ICD-10-CM

## 2018-06-08 DIAGNOSIS — M6281 Muscle weakness (generalized): Secondary | ICD-10-CM | POA: Diagnosis present

## 2018-06-08 DIAGNOSIS — R2689 Other abnormalities of gait and mobility: Secondary | ICD-10-CM | POA: Insufficient documentation

## 2018-06-08 NOTE — Therapy (Signed)
[facility] [address] [place], [place], [address] Phone: [phone]   Fax:  [phone]  Pediatric Physical Therapy Treatment  Patient Details  Name: Yvonne Salazar MRN: [id number] Date of Birth: October 11, 1991 No data recorded  Encounter date: 06/08/2018  End of Session - 06/08/18 1009    Visit Number  404    Number of Visits  60    Date for PT Re-Evaluation  07/05/18    Authorization Type  UHC    Authorization Time Period  Next recertification due 07/05/18    Authorization - Visit Number  9 2019    Authorization - Number of Visits  60    PT Start Time  0817    PT Stop Time  0900    PT Time Calculation (min)  43 min    Activity Tolerance  Patient tolerated treatment well    Behavior During Therapy  Willing to participate       History reviewed. No pertinent past medical history.  Past Surgical History:  Procedure Laterality Date  . FOOT CAPSULE RELEASE W/ PERCUTANEOUS HEEL CORD LENGTHENING, TIBIAL TENDON TRANSFER  2004  . scoliosis repair  2008  . TENNIS ELBOW RELEASE/NIRSCHEL PROCEDURE      There were no vitals filed for this visit.                Pediatric PT Treatment - 06/08/18 0959      Pain Comments   Pain Comments  No specific c/o pain, but continues to have numbness/tingling in her distal legs, left more than right.        Subjective Information   Patient Comments  Yvonne Salazar is concerned that her right shoulder is "higher" due to right SCM tightness, as her right elbow barely rests on her armrest any more.  Family is also very excited about new niece, Yvonne Salazar, born to [place]unt[place]'s sister, Yvonne Meadmma, on 05/17/18.        PT Pediatric Exercise/Activities   Session Observed by  Mom    Strengthening Activities  Lifting left arm off armrest to about shoulder height, X 10 active assist individually, and then X 10 with both arms; actively resisted hip abduction stretches; heel and toe taps in w/c X about 20  both legs      Therapeutic Activities   Therapeutic Activity Details  Deep tissue massage at bilateral traps, rhomboids and erector spinae with chair reclined about 30 degrees.      ROM   Hip Abduction and ER  passively moved bilateral hips into abduction within w/c seat    Knee Extension(hamstrings)  passively stretched to end-range in w/c    Comment  Anchored right shoulder while K laterally flexed neck both directions (lift back to midline for resting posture of right lateral flexion, and no left lateral flexion past midline); also, PT attempted to stretch neck into some left lateral flexion while holding right shoulder, stabilized              Patient Education - 06/08/18 1008    Education Provided  Yes    Education Description  encouraged K each day to "tick-tock" at head, move in and out of lateral flexion, really pushing out of right lateral flexion for active right SCM stretch; encouraged daily, every morning, but also after sustained UE work or when in one position a long time.    Person(s) Educated  Mother;Patient    Method Education  Verbal explanation;Observed session    Comprehension  Returned  demonstration       Peds PT Short Term Goals - 03/30/18 0956      PEDS PT  SHORT TERM GOAL #1   Title  Yvonne Salazar will be able to raise her left arm to at least 60 degrees to increase function and strength in this arm.    Baseline  Yvonne Salazar can lift left UE briefly off arm rest, but not to 60 degrees.    Status  On-going    Target Date  07/05/18      PEDS PT  SHORT TERM GOAL #4   Title  Yvonne Salazar will rotate neck to 45 degrees to the right while in wheelchair to see periphery without turning her chair.    Baseline  about 30 degrees    Status  On-going    Target Date  07/05/18      PEDS PT  SHORT TERM GOAL #6   Title  Yvonne Salazar will be able to kick/lift both feet beyond foot rests of w/c to increase strength and A/ROM in LE's, and not contribute to futher tightening in hamstrings that have  led to tightness and low back pain.      Baseline  A/AROM on left LE    Status  On-going      PEDS PT  SHORT TERM GOAL #8   Title  Yvonne Salazar will be educated on activities for core strenghtening that can be done within the confines of her w/c.     Status  On-going    Target Date  07/05/18       Peds PT Long Term Goals - 01/05/18 1123      PEDS PT  LONG TERM GOAL #1   Title  Yvonne Salazar will participate in chair exercises that she can perform independently for cardiovascular health.    Baseline  Finding HEP work to carryover at home, and sustain for more than a moment or 2 is challenging; PT would like to continue to develop.    Time  12    Period  Months    Status  On-going    Target Date  07/05/18      PEDS PT  LONG TERM GOAL #2   Title  Yvonne Salazar will have two week span where her pain does not exceed 3/10.    Baseline  Has had score as high as 7; often 3-4    Time  12    Period  Months    Status  New    Target Date  01/05/19       Plan - 06/08/18 1019    Clinical Impression Statement  Yvonne Salazar is growing tighter in right SCM and possibly right UE musculature as indicated by resting posture with Yvonne Salazar's right arm no longer resting comfortably on her armrest in her wheelchair.   Yvonne Salazar continues to have issues with parasthesia through distal LE's, left more than right, due to lack of positional variability, pelvic obliquity anad inability to actively move into new positions.      PT plan  Continue PT every other week to increase Yvonne Salazar's A/ROM and decrease progression of contractures that are a part of Ulrich CMD.         Patient will benefit from skilled therapeutic intervention in order to improve the following deficits and impairments:  Decreased ability to maintain good postural alignment, Decreased ability to participate in recreational activities, Decreased function at home and in the community  Visit Diagnosis: Posture abnormality  Muscle weakness (generalized)  Decreased mobility  [place]inf[place] Cunas  congenital muscular dystrophy (HCC)   Problem List There are no active problems to display for this patient.   SAWULSKI,CARRIE 06/08/2018, 10:22 AM  [facility] [address] [place], [place], [address] Phone: [phone]   Fax:  [phone]  Name: NALANI ANDREEN MRN: [id number] Date of Birth: August 03, 1991   Everardo Beals, PT 06/08/18 10:24 AM Phone: [phone] Fax: [phone]

## 2018-06-22 ENCOUNTER — Ambulatory Visit: Payer: 59 | Admitting: Physical Therapy

## 2018-06-22 ENCOUNTER — Encounter: Payer: Self-pay | Admitting: Physical Therapy

## 2018-06-22 DIAGNOSIS — R2689 Other abnormalities of gait and mobility: Secondary | ICD-10-CM

## 2018-06-22 DIAGNOSIS — M6281 Muscle weakness (generalized): Secondary | ICD-10-CM

## 2018-06-22 DIAGNOSIS — R293 Abnormal posture: Secondary | ICD-10-CM

## 2018-06-22 DIAGNOSIS — R52 Pain, unspecified: Secondary | ICD-10-CM

## 2018-06-22 DIAGNOSIS — G7109 Other specified muscular dystrophies: Secondary | ICD-10-CM

## 2018-06-22 NOTE — Therapy (Signed)
[place], [place], [address] Phone: [phone]   Fax:  [phone]  Pediatric Physical Therapy Treatment  Patient Details  Name: Yvonne Salazar MRN: [id number] Date of Birth: 1991/05/16 No data recorded  Encounter date: 06/22/2018  End of Session - 06/22/18 1115    Visit Number  405    Number of Visits  60    Date for PT Re-Evaluation  12/23/18    Authorization Type  [employer]    Authorization Time Period  Next recertification due 7/85/88    Authorization - Visit Number  10 2019    Authorization - Number of Visits  60    PT Start Time  0820    PT Stop Time  0902    PT Time Calculation (min)  42 min    Activity Tolerance  Patient tolerated treatment well    Activity Tolerance  Patient tolerated treatment well       History reviewed. No pertinent past medical history.  Past Surgical History:  Procedure Laterality Date  . FOOT CAPSULE RELEASE W/ PERCUTANEOUS HEEL CORD LENGTHENING, TIBIAL TENDON TRANSFER  2004  . scoliosis repair  2008  . TENNIS ELBOW RELEASE/NIRSCHEL PROCEDURE      There were no vitals filed for this visit.                Pediatric PT Treatment - 06/22/18 0820      Pain Assessment   Pain Scale  0-10    Pain Score  6     Pain Type  Chronic pain    Pain Location  Neck    Pain Orientation  Left    Pain Radiating Towards  starts at shoulder/neck up to head, left eye, sometimes teeth    Pain Descriptors / Indicators  Grimacing;Headache;Pressure;Radiating    Pain Frequency  Once a week    Pain Onset  Unable to tell    Patients Stated Pain Goal  2    Pain Intervention(s)  Massage;Distraction    Multiple Pain Sites  Yes      Subjective Information   Patient Comments  Yvonne Salazar requesting massage today.      PT Pediatric Exercise/Activities   Session Observed by  Mom    Strengthening Activities  Lifting left UE with and without assist; LE assisted kicking  and lifting feet from foot plates; shoulder circles both directions; tilted 30 degrees and "sat up" or crunched X 5      Therapeutic Activities   Therapeutic Activity Details  Deep tissue massage along traps, interscapular and posterior neck muscles at occiput              Patient Education - 06/22/18 1113    Education Provided  Yes    Education Description  daily shoulder circles, both directions, multiple times a day; and asked K on "good days" to tilt chair back and sit up or crunch X 5 reps    Person(s) Educated  Mother;Patient    Method Education  Verbal explanation;Observed session    Comprehension  Returned demonstration       Peds PT Short Term Goals - 06/22/18 1147      PEDS PT  SHORT TERM GOAL #1   Title  Yvonne Salazar will be able to raise her left arm to at least 60 degrees to increase function and strength in this arm.    Baseline  did gain from 10 to 30 degrees, but needs assist to go 60-70 degrees  Status  Not Met      PEDS PT  SHORT TERM GOAL #4   Title  Yvonne Salazar will rotate neck to 45 degrees to the right while in wheelchair to see periphery without turning her chair.    Baseline  did not increase angle      Additional Short Term Goals   Additional Short Term Goals  Yes      PEDS PT  SHORT TERM GOAL #6   Title  Yvonne Salazar will be able to kick/lift both feet beyond foot rests of w/c to increase strength and A/ROM in LE's, and not contribute to futher tightening in hamstrings that have led to tightness and low back pain.      Baseline  needs assist    Status  Not Met      PEDS PT  SHORT TERM GOAL #8   Title  Yvonne Salazar will be educated on activities for core strenghtening that can be done within the confines of her w/c.     Status  Achieved      PEDS PT SHORT TERM GOAL #9   TITLE  Yvonne Salazar will report less frequent headaches (less than one week).     Baseline  Yvonne Salazar reports 1-2 headaches a week.    Time  6    Period  Months    Status  New    Target Date  12/23/18      PEDS PT  SHORT TERM GOAL #10   TITLE  Yvonne Salazar will be able to sit up from back of w/c when it is tilted more than 30 degrees.    Baseline  Yvonne Salazar can sit up when w/c is tilted at 30 degrees, but not at an increased angle.     Time  6    Period  Months    Status  New    Target Date  12/23/18      PEDS PT SHORT TERM GOAL #11   TITLE  Yvonne Salazar will be able to determine self-massage options using hook massager to help with pain managment.    Baseline  Yvonne Salazar has bought a Patent attorney, but would like PT to educate her on techniques.    Time  6    Period  Months    Status  New    Target Date  12/23/18       Peds PT Long Term Goals - 06/22/18 1220      PEDS PT  LONG TERM GOAL #1   Title  Yvonne Salazar will participate in chair exercises that she can perform independently for cardiovascular health.    Status  Achieved      PEDS PT  LONG TERM GOAL #2   Title  Yvonne Salazar will have two week span where her pain does not exceed 3/10.    Baseline  reports 6/10 today    Status  On-going    Target Date  12/23/18       Plan - 06/22/18 1116    Clinical Impression Statement  Yvonne Salazar uses her power wheelchair and is lifted in and out by caregivers.  She has very limited seating options and postural changes.  She continues to complain of pain, sometimes radiating pain and numbness in distal LE's, and frequent headaches (today was 6.5/10) that she says happen at least once a week.  As expected with Jacklynn Ganong MD, Yvonne Salazar is increasingly limited in active range of motion and functional movement of extremities.  She can lift her left arm 30 degrees off of her  armrest independently, and up to 70 degrees with assistanc.e  She can rotate her neck to the right about 25 degrees, limited by her tight right SCM.  She cannot lift her feet off of the foot plates without support, but has some independent/small range of motion at ankles.  She can be extended from 90 degrees of knee flexion another 25 degrees bilaterally.      Rehab Potential  Fair    Clinical  impairments affecting rehab potential  N/A    PT Frequency  Every other week    PT Duration  3 months    PT Treatment/Intervention  Therapeutic activities;Therapeutic exercises;Patient/family education;Wheelchair management;Manual techniques;Self-care and home management;Instruction proper posture/body mechanics    PT plan  Recommend continuing PT every other week to address pain and maximize comfort and prevent worsening contractures of joints.         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   Ryle Buscemi 06/22/2018, 12:22 PM  [place], [place], [address] Phone: [phone]   Fax:  [phone]  Name: Yvonne Salazar MRN: [id number] Date of Birth: 1991-09-11   Lawerance Bach, PT 06/22/18 12:23 PM Phone: [phone] Fax: [phone]

## 2018-06-23 DIAGNOSIS — J9690 Respiratory failure, unspecified, unspecified whether with hypoxia or hypercapnia: Secondary | ICD-10-CM | POA: Diagnosis not present

## 2018-06-27 DIAGNOSIS — J9690 Respiratory failure, unspecified, unspecified whether with hypoxia or hypercapnia: Secondary | ICD-10-CM | POA: Diagnosis not present

## 2018-07-06 ENCOUNTER — Ambulatory Visit: Payer: 59 | Admitting: Physical Therapy

## 2018-07-20 ENCOUNTER — Ambulatory Visit: Payer: 59 | Admitting: Physical Therapy

## 2018-07-24 DIAGNOSIS — J9690 Respiratory failure, unspecified, unspecified whether with hypoxia or hypercapnia: Secondary | ICD-10-CM | POA: Diagnosis not present

## 2018-07-28 DIAGNOSIS — J9690 Respiratory failure, unspecified, unspecified whether with hypoxia or hypercapnia: Secondary | ICD-10-CM | POA: Diagnosis not present

## 2018-08-03 ENCOUNTER — Ambulatory Visit: Payer: 59 | Attending: Pediatrics | Admitting: Physical Therapy

## 2018-08-03 ENCOUNTER — Encounter: Payer: Self-pay | Admitting: Physical Therapy

## 2018-08-03 DIAGNOSIS — R293 Abnormal posture: Secondary | ICD-10-CM

## 2018-08-03 DIAGNOSIS — R52 Pain, unspecified: Secondary | ICD-10-CM

## 2018-08-03 DIAGNOSIS — M6281 Muscle weakness (generalized): Secondary | ICD-10-CM | POA: Insufficient documentation

## 2018-08-03 DIAGNOSIS — G7109 Other specified muscular dystrophies: Secondary | ICD-10-CM | POA: Diagnosis present

## 2018-08-03 NOTE — Therapy (Signed)
Yvonne Salazar, [place], [address] Phone: [phone]   Fax:  [phone]  Pediatric Physical Therapy Treatment  Patient Details  Name: Yvonne Salazar MRN: [id number] Date of Birth: [birth year] No data recorded  Encounter date: 08/03/2018  End of Session - 08/03/18 1142    Visit Number  406    Number of Visits  60    Date for PT Re-Evaluation  12/23/18    Authorization Type  [employer]    Authorization Time Period  Next recertification due 04/07/24    Authorization - Visit Number  11   2019   Authorization - Number of Visits  60    PT Start Time  0817    PT Stop Time  0900    PT Time Calculation (min)  43 min    Activity Tolerance  Patient tolerated treatment well    Activity Tolerance  Patient tolerated treatment well       History reviewed. No pertinent past medical history.  Past Surgical History:  Procedure Laterality Date  . FOOT CAPSULE RELEASE W/ PERCUTANEOUS HEEL CORD LENGTHENING, TIBIAL TENDON TRANSFER  2004  . scoliosis repair  2008  . TENNIS ELBOW RELEASE/NIRSCHEL PROCEDURE      There were no vitals filed for this visit.                Pediatric PT Treatment - 08/03/18 1139      Pain Comments   Pain Comments  No specific pain today; c/o tightness deep in neck, at scapulae at low back and hips      Subjective Information   Patient Comments  Yvonne Salazar said power chair was broke for 2 weeks and had to sit in old chair.  Needed a pillow in old chair without custom trunk behind her right sided torso.  Yvonne Salazar has been working on a new print.  She enjoys her niece, Yvonne Salazar.       PT Pediatric Exercise/Activities   Session Observed by  Mom    Strengthening Activities  twisting through low torso X 10; sit ups when chair is tilted about 30 degrees  X 5, 4 sets; 1st set, Yvonne Salazar performed independently, but PT assisted next 3 sets to avoid excessive right lateral flexion at neck       Therapeutic Activities   Therapeutic Activity Details  Deep tissue massage at neck, shoulders and scapulae, lower back on left      ROM   Knee Extension(hamstrings)  stretched to end-range    Ankle DF  ankle pumps encouraged during massage    Comment  tried to mobilize scapulae, but Yvonne Salazar did not tolerate much movement because "it pinches my skin"              Patient Education - 08/03/18 1142    Education Provided  Yes    Education Description  encourage torso twists to stretch low back, daily, mutliple times    Person(s) Educated  Mother;Patient    Method Education  Verbal explanation;Observed session;Demonstration    Comprehension  Returned demonstration       Peds PT Short Term Goals - 06/22/18 1147      PEDS PT  SHORT TERM GOAL #1   Title  Yvonne Salazar will be able to raise her left arm to at least 60 degrees to increase function and strength in this arm.    Baseline  did gain from 10 to 30 degrees, but needs assist to go 60-70 degrees  Status  Not Met      PEDS PT  SHORT TERM GOAL #4   Title  Yvonne Salazar will rotate neck to 45 degrees to the right while in wheelchair to see periphery without turning her chair.    Baseline  did not increase angle      Additional Short Term Goals   Additional Short Term Goals  Yes      PEDS PT  SHORT TERM GOAL #6   Title  Yvonne Salazar will be able to kick/lift both feet beyond foot rests of w/c to increase strength and A/ROM in LE's, and not contribute to futher tightening in hamstrings that have led to tightness and low back pain.      Baseline  needs assist    Status  Not Met      PEDS PT  SHORT TERM GOAL #8   Title  Yvonne Salazar will be educated on activities for core strenghtening that can be done within the confines of her w/c.     Status  Achieved      PEDS PT SHORT TERM GOAL #9   TITLE  Yvonne Salazar will report less frequent headaches (less than one week).     Baseline  Yvonne Salazar reports 1-2 headaches a week.    Time  6    Period  Months    Status  New    Target  Date  12/23/18      PEDS PT SHORT TERM GOAL #10   TITLE  Yvonne Salazar will be able to sit up from back of w/c when it is tilted more than 30 degrees.    Baseline  Yvonne Salazar can sit up when w/c is tilted at 30 degrees, but not at an increased angle.     Time  6    Period  Months    Status  New    Target Date  12/23/18      PEDS PT SHORT TERM GOAL #11   TITLE  Yvonne Salazar will be able to determine self-massage options using hook massager to help with pain managment.    Baseline  Yvonne Salazar has bought a Patent attorney, but would like PT to educate her on techniques.    Time  6    Period  Months    Status  New    Target Date  12/23/18       Peds PT Long Term Goals - 06/22/18 1220      PEDS PT  LONG TERM GOAL #1   Title  Yvonne Salazar will participate in chair exercises that she can perform independently for cardiovascular health.    Status  Achieved      PEDS PT  LONG TERM GOAL #2   Title  Yvonne Salazar will have two week span where her pain does not exceed 3/10.    Baseline  reports 6/10 today    Status  On-going    Target Date  12/23/18       Plan - 08/03/18 1143    Clinical Impression Statement  Yvonne Salazar has such limited mobilities, all joints are tight and muscles have limited elasticity, tightness and are prone to cramping.  She benefits from massage, range of motion and suggestions for HEP to change posture.      PT plan  Continue PT every other week to increase Yvonne Salazar's A/ROM and promote flexibility, movement out of resting posture.         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   Norvel Wenker 08/03/2018, 11:45 AM  [place], [place], [address] Phone: [phone]   Fax:  [phone]  Name: Yvonne Salazar MRN: [id number] Date of Birth: 1991-08-20   Lawerance Bach, PT 08/03/18 11:45 AM Phone: [phone] Fax: [phone]

## 2018-08-17 ENCOUNTER — Encounter: Payer: Self-pay | Admitting: Physical Therapy

## 2018-08-17 ENCOUNTER — Ambulatory Visit: Payer: 59 | Admitting: Physical Therapy

## 2018-08-17 DIAGNOSIS — R52 Pain, unspecified: Secondary | ICD-10-CM

## 2018-08-17 DIAGNOSIS — R293 Abnormal posture: Secondary | ICD-10-CM | POA: Diagnosis not present

## 2018-08-17 DIAGNOSIS — M6281 Muscle weakness (generalized): Secondary | ICD-10-CM

## 2018-08-17 DIAGNOSIS — G7109 Other specified muscular dystrophies: Secondary | ICD-10-CM

## 2018-08-17 NOTE — Therapy (Signed)
[place], [place], [address] Phone: [phone]   Fax:  [phone]  Pediatric Physical Therapy Treatment  Patient Details  Name: Yvonne Salazar MRN: [id number] Date of Birth: 04/19/1991 No data recorded  Encounter date: 08/17/2018  End of Session - 08/17/18 1248    Visit Number  407    Number of Visits  60    Date for PT Re-Evaluation  12/23/18    Authorization Type  [employer]    Authorization Time Period  Next recertification due 0/99/83    Authorization - Visit Number  12   2019   Authorization - Number of Visits  60    PT Start Time  0825   arrived late   PT Stop Time  0900    PT Time Calculation (min)  35 min    Activity Tolerance  Patient tolerated treatment well    Behavior During Therapy  Willing to participate       History reviewed. No pertinent past medical history.  Past Surgical History:  Procedure Laterality Date  . FOOT CAPSULE RELEASE W/ PERCUTANEOUS HEEL CORD LENGTHENING, TIBIAL TENDON TRANSFER  2004  . scoliosis repair  2008  . TENNIS ELBOW RELEASE/NIRSCHEL PROCEDURE      There were no vitals filed for this visit.                Pediatric PT Treatment - 08/17/18 1243      Pain Assessment   Pain Scale  0-10    Pain Score  3     Pain Type  Chronic pain    Pain Location  Head    Pain Orientation  Left    Pain Radiating Towards  eye, shoulder (k also continues to c/o parasthesias in left foot/lower leg)    Pain Descriptors / Indicators  Crushing;Discomfort;Headache    Pain Frequency  Several days a week    Pain Onset  On-going    Patients Stated Pain Goal  1    Pain Intervention(s)  Massage    Multiple Pain Sites  Yes      Pain Comments   Pain Comments  K does feel her pain is worse related to menstrual cycle      Subjective Information   Patient Comments  [name] is concerned about increasing intensity of numbness/tingling in left leg.  She is  also concerned about taking gabapentin      PT Pediatric Exercise/Activities   Session Observed by  Mom    Strengthening Activities  twisting/rotating through lower torso with vc's to use arms about 20; encouraged active right rotaiton of neck at start of session; actively lifted toes from w/c foot rest with emphasis on eversion      Therapeutic Activities   Therapeutic Activity Details  Deep tissue massage along base of occiput, SCM bilaterally, traps and rhomboids bilaterally      ROM   Knee Extension(hamstrings)  stretched to end-range    Ankle DF  ankle pumps with emphasis on df and eversion; PT passively stretched right ankle into eversion              Patient Education - 08/17/18 1247    Education Provided  Yes    Education Description  asked K and mom to try increasing hip extension while in bed to see if this will help parasthesia in right lower leg; also asked PT to emphasize eversion when lifting toes during toe taps    Person(s) Educated  Mother;Patient    Method Education  Verbal explanation;Observed session;Demonstration    Comprehension  Returned demonstration       Peds PT Short Term Goals - 06/22/18 1147      PEDS PT  SHORT TERM GOAL #1   Title  [name] will be able to raise her left arm to at least 60 degrees to increase function and strength in this arm.    Baseline  did gain from 10 to 30 degrees, but needs assist to go 60-70 degrees    Status  Not Met      PEDS PT  SHORT TERM GOAL #4   Title  [name] will rotate neck to 45 degrees to the right while in wheelchair to see periphery without turning her chair.    Baseline  did not increase angle      Additional Short Term Goals   Additional Short Term Goals  Yes      PEDS PT  SHORT TERM GOAL #6   Title  [name] will be able to kick/lift both feet beyond foot rests of w/c to increase strength and A/ROM in LE's, and not contribute to futher tightening in hamstrings that have led to tightness and low back pain.       Baseline  needs assist    Status  Not Met      PEDS PT  SHORT TERM GOAL #8   Title  [name] will be educated on activities for core strenghtening that can be done within the confines of her w/c.     Status  Achieved      PEDS PT SHORT TERM GOAL #9   TITLE  [name] will report less frequent headaches (less than one week).     Baseline  [name] reports 1-2 headaches a week.    Time  6    Period  Months    Status  New    Target Date  12/23/18      PEDS PT SHORT TERM GOAL #10   TITLE  [name] will be able to sit up from back of w/c when it is tilted more than 30 degrees.    Baseline  [name] can sit up when w/c is tilted at 30 degrees, but not at an increased angle.     Time  6    Period  Months    Status  New    Target Date  12/23/18      PEDS PT SHORT TERM GOAL #11   TITLE  [name] will be able to determine self-massage options using hook massager to help with pain managment.    Baseline  [name] has bought a Patent attorney, but would like PT to educate her on techniques.    Time  6    Period  Months    Status  New    Target Date  12/23/18       Peds PT Long Term Goals - 06/22/18 1220      PEDS PT  LONG TERM GOAL #1   Title  [name] will participate in chair exercises that she can perform independently for cardiovascular health.    Status  Achieved      PEDS PT  LONG TERM GOAL #2   Title  [name] will have two week span where her pain does not exceed 3/10.    Baseline  reports 6/10 today    Status  On-going    Target Date  12/23/18       Plan - 08/17/18 1248  Clinical Impression Statement  [name] is difficult to move out of resting postures.  She has noticed that she tightens her proximal muscles even more (clenches glutes, for example) when moved out of w/c, as her disease progresses and she notices increased weakness.      PT plan  Continue PT every other week to increase [name] comfort, A/ROM and help develop HEP and comfort measures for her complaints of pain and parasthesia.         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   Auden Tatar 08/17/2018, 12:51 PM  [place] [place], [place], [address] Phone: [phone]   Fax:  [phone]  Name: NEA GITTENS MRN: [id number] Date of Birth: 1991-08-08   Lawerance Bach, PT 08/17/18 12:51 PM Phone: [phone] Fax: [phone]

## 2018-08-22 DIAGNOSIS — Q825 Congenital non-neoplastic nevus: Secondary | ICD-10-CM | POA: Diagnosis not present

## 2018-08-24 DIAGNOSIS — J9690 Respiratory failure, unspecified, unspecified whether with hypoxia or hypercapnia: Secondary | ICD-10-CM | POA: Diagnosis not present

## 2018-08-28 DIAGNOSIS — J9690 Respiratory failure, unspecified, unspecified whether with hypoxia or hypercapnia: Secondary | ICD-10-CM | POA: Diagnosis not present

## 2018-08-31 ENCOUNTER — Ambulatory Visit: Payer: 59 | Attending: Pediatrics | Admitting: Physical Therapy

## 2018-08-31 ENCOUNTER — Encounter: Payer: Self-pay | Admitting: Physical Therapy

## 2018-08-31 DIAGNOSIS — R52 Pain, unspecified: Secondary | ICD-10-CM | POA: Diagnosis present

## 2018-08-31 DIAGNOSIS — R293 Abnormal posture: Secondary | ICD-10-CM

## 2018-08-31 DIAGNOSIS — M6281 Muscle weakness (generalized): Secondary | ICD-10-CM

## 2018-08-31 DIAGNOSIS — G7109 Other specified muscular dystrophies: Secondary | ICD-10-CM | POA: Insufficient documentation

## 2018-08-31 NOTE — Therapy (Signed)
[place], [place], [address] Phone: [phone]   Fax:  [phone]  Pediatric Physical Therapy Treatment  Patient Details  Name: Yvonne Salazar MRN: [id number] Date of Birth: 1991/07/30 No data recorded  Encounter date: 08/31/2018  End of Session - 08/31/18 1249    Visit Number  [id number]    Number of Visits  60    Date for PT Re-Evaluation  12/23/18    Authorization Type  U[employer]    Authorization Time Period  Next recertification due 1/51/76    Authorization - Visit Number  13   2019   Authorization - Number of Visits  56    PT Start Time  0822   arrived late   PT Stop Time  0900    PT Time Calculation (min)  38 min    Activity Tolerance  Patient tolerated treatment well    Behavior During Therapy  Willing to participate       History reviewed. No pertinent past medical history.  Past Surgical History:  Procedure Laterality Date  . FOOT CAPSULE RELEASE W/ PERCUTANEOUS HEEL CORD LENGTHENING, TIBIAL TENDON TRANSFER  2004  . scoliosis repair  2008  . TENNIS ELBOW RELEASE/NIRSCHEL PROCEDURE      There were no vitals filed for this visit.                Pediatric PT Treatment - 08/31/18 1245      Pain Assessment   Pain Scale  0-10      Pain Comments   Pain Comments  Yvonne Salazar had no specific pains today.  She said her tingling in legs was a little better than last session.  "it comes and goes."      Subjective Information   Patient Comments  Yvonne Salazar. has been busy with [employer] preparing to show some of her pieces.      PT Pediatric Exercise/Activities   Strengthening Activities  Active ankle pumping both with feet in foot rests and hwen PT had stretched LE's/knees into extension    Self-care  used shepherd's hook massager (Yvonne Salazar showed how she uses at occiput and neck for pressure points); attempted to use at low back but caused Yvonne Salazar disxomfort.      Activities Performed   Comment  .      Gross Motor Activities   Comment  Tilted and reclined in chair, but focused on increasing hip angle to the point before it pulls Yvonne Salazar into increased anterior pelvic tilt      Therapeutic Activities   Therapeutic Activity Details  Deep tissue massage along occiput, at traps and rhomboids, at hamstrings, calves and IT Bands.      ROM   Knee Extension(hamstrings)  stretched to end-range              Patient Education - 08/31/18 1248    Education Provided  Yes    Education Description  explained how Yvonne Salazar could cut a tennis ball in half and use for low back pressure points; also encouraged Yvonne Salazar to actively shoulder shrug for chair exercise; encouraged changing angle of hips in w/c    Person(s) Educated  Mother;Patient    Method Education  Verbal explanation;Observed session;Demonstration    Comprehension  Verbalized understanding       Peds PT Short Term Goals - 06/22/18 1147      PEDS PT  SHORT TERM GOAL #1   Title  Yvonne Salazar will be able to raise  her left arm to at least 60 degrees to increase function and strength in this arm.    Baseline  did gain from 10 to 30 degrees, but needs assist to go 60-70 degrees    Status  Not Met      PEDS PT  SHORT TERM GOAL #4   Title  Yvonne Salazar will rotate neck to 45 degrees to the right while in wheelchair to see periphery without turning her chair.    Baseline  did not increase angle      Additional Short Term Goals   Additional Short Term Goals  Yes      PEDS PT  SHORT TERM GOAL #6   Title  Yvonne Salazar will be able to kick/lift both feet beyond foot rests of w/c to increase strength and A/ROM in LE's, and not contribute to futher tightening in hamstrings that have led to tightness and low back pain.      Baseline  needs assist    Status  Not Met      PEDS PT  SHORT TERM GOAL #8   Title  Yvonne Salazar will be educated on activities for core strenghtening that can be done within the confines of her w/c.     Status  Achieved      PEDS PT SHORT TERM GOAL  #9   TITLE  Yvonne Salazar will report less frequent headaches (less than one week).     Baseline  Yvonne Salazar reports 1-2 headaches a week.    Time  6    Period  Months    Status  New    Target Date  12/23/18      PEDS PT SHORT TERM GOAL #10   TITLE  Yvonne Salazar will be able to sit up from back of w/c when it is tilted more than 30 degrees.    Baseline  Yvonne Salazar can sit up when w/c is tilted at 30 degrees, but not at an increased angle.     Time  6    Period  Months    Status  New    Target Date  12/23/18      PEDS PT SHORT TERM GOAL #11   TITLE  Yvonne Salazar will be able to determine self-massage options using hook massager to help with pain managment.    Baseline  Yvonne Salazar has bought a Patent attorney, but would like PT to educate her on techniques.    Time  6    Period  Months    Status  New    Target Date  12/23/18       Peds PT Long Term Goals - 06/22/18 1220      PEDS PT  LONG TERM GOAL #1   Title  Yvonne Salazar will participate in chair exercises that she can perform independently for cardiovascular health.    Status  Achieved      PEDS PT  LONG TERM GOAL #2   Title  Yvonne Salazar will have two week span where her pain does not exceed 3/10.    Baseline  reports 6/10 today    Status  On-going    Target Date  12/23/18       Plan - 08/31/18 1250    Clinical Impression Statement  Yvonne Salazar is so limited in posture.  When hip flexion is minimized by reclining w/c, she moves into anterior pelvic tilt due to severely tight hip flexors.  She gets relief of pressure points through massage.    PT plan  Continue PT every other week  to increase Yvonne Salazar's comfort, activity level and ability to participate fully in daily living.         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   SAWULSKI,CARRIE 08/31/2018, 12:52 PM  [facility] [place], [place], [address] Phone: [phone]   Fax:  [phone]  Name: Yvonne Salazar MRN: [id number] Date of Birth: 12/27/1990   Lawerance Bach, PT 08/31/18 12:52 PM Phone: [phone] Fax: [phone]

## 2018-09-14 ENCOUNTER — Ambulatory Visit: Payer: 59 | Admitting: Physical Therapy

## 2018-09-14 ENCOUNTER — Encounter: Payer: Self-pay | Admitting: Physical Therapy

## 2018-09-14 DIAGNOSIS — R293 Abnormal posture: Secondary | ICD-10-CM | POA: Diagnosis not present

## 2018-09-14 DIAGNOSIS — G7109 Other specified muscular dystrophies: Secondary | ICD-10-CM

## 2018-09-14 DIAGNOSIS — M6281 Muscle weakness (generalized): Secondary | ICD-10-CM

## 2018-09-14 DIAGNOSIS — R52 Pain, unspecified: Secondary | ICD-10-CM

## 2018-09-14 NOTE — Therapy (Signed)
[facility] [place], [place], [address] Phone: [phone]   Fax:  [phone]  Pediatric Physical Therapy Treatment  Patient Details  Name: Yvonne Salazar MRN: [id number] Date of Birth: 1991-05-25 No data recorded  Encounter date: 09/14/2018  End of Session - 09/14/18 1523    Visit Number  409    Number of Visits  60    Date for PT Re-Evaluation  12/23/18    Authorization Type  UHC    Authorization Time Period  Next recertification due 12/23/18    Authorization - Visit Number  14   2019   Authorization - Number of Visits  60    PT Start Time  0820    PT Stop Time  0900    PT Time Calculation (min)  40 min    Activity Tolerance  Patient tolerated treatment well    Behavior During Therapy  Willing to participate       History reviewed. No pertinent past medical history.  Past Surgical History:  Procedure Laterality Date  . FOOT CAPSULE RELEASE W/ PERCUTANEOUS HEEL CORD LENGTHENING, TIBIAL TENDON TRANSFER  2004  . scoliosis repair  2008  . TENNIS ELBOW RELEASE/NIRSCHEL PROCEDURE      There were no vitals filed for this visit.                Pediatric PT Treatment - 09/14/18 1519      Pain Assessment   Pain Scale  0-10    Pain Score  3     Pain Type  Chronic pain;Neuropathic pain    Pain Location  Leg    Pain Orientation  Left    Pain Radiating Towards  tingling radiates down leg    Pain Descriptors / Indicators  Numbness;Shooting;Tingling    Pain Frequency  Several days a week   a "few bad days recently"   Pain Onset  On-going    Patients Stated Pain Goal  1    Pain Intervention(s)  Distraction;Massage    Multiple Pain Sites  Yes      Pain Comments   Pain Comments  K noted tenderness at right ankle (lateral) when stretched into eversion, also, 3/10      Subjective Information   Patient Comments  K has stopped seeing therapist for counseling/anxiety, and is working  with massage therapist regularly.        PT Pediatric Exercise/Activities   Session Observed by  Mom    Strengthening Activities  Foot "windshield wipers" IR/ER in foot plates bilaterally, about 10 at a time; active ankle pumps X 10 each      Activities Performed   Core Stability Details  Actively rotated neck into right rotation during neck massage      Therapeutic Activities   Therapeutic Activity Details  Deep tissue massage at right SCM, right trap, left lat dorsi and both calves (gastroc-soleus) and right peroneals      ROM   Knee Extension(hamstrings)  stretched to end-range    Ankle DF  stretched both ankles into df and focused in eversion stretch of right ankle    Comment  moved left foot to left foot plate (as it migrates to right)              Patient Education - 09/14/18 1523    Education Provided  Yes    Education Description  asked mom to stretch right ankle out of inverted posture, daily, and made aware of increasing  tigthness    Person(s) Educated  Mother;Patient    Method Education  Verbal explanation;Observed session;Demonstration    Comprehension  Verbalized understanding       Peds PT Short Term Goals - 09/14/18 1525      PEDS PT SHORT TERM GOAL #9   TITLE  Yvonne Salazar will report less frequent headaches (less than one week).     Status  On-going    Target Date  12/23/18      PEDS PT SHORT TERM GOAL #10   TITLE  Yvonne Salazar will be able to sit up from back of w/c when it is tilted more than 30 degrees.    Status  On-going    Target Date  12/23/18      PEDS PT SHORT TERM GOAL #11   TITLE  Yvonne Salazar will be able to determine self-massage options using hook massager to help with pain managment.    Status  Achieved       Peds PT Long Term Goals - 06/22/18 1220      PEDS PT  LONG TERM GOAL #1   Title  Yvonne Salazar will participate in chair exercises that she can perform independently for cardiovascular health.    Status  Achieved      PEDS PT  LONG TERM GOAL #2   Title   Yvonne Salazar will have two week span where her pain does not exceed 3/10.    Baseline  reports 6/10 today    Status  On-going    Target Date  12/23/18       Plan - 09/14/18 1524    Clinical Impression Statement  Yvonne Salazar is increasingly tight in right foot/ankle with right ankle in strongly inverted posture.  She continues to have parasthesia in left LE, and left hip is adducted/IR'd as demonstrated by inability to maintain left foot in foot plate.     PT plan  Continue PT every other week to promote increase ROM and avoid worsening contractures.       Patient will benefit from skilled therapeutic intervention in order to improve the following deficits and impairments:  Decreased ability to maintain good postural alignment, Decreased ability to participate in recreational activities, Decreased function at home and in the community  Visit Diagnosis: Posture abnormality  Muscle weakness (generalized)  Pain  Ullrich congenital muscular dystrophy (HCC)   Problem List There are no active problems to display for this patient.   SAWULSKI,CARRIE 09/14/2018, 3:27 PM  [facility] [address] [place], [place], [address] Phone: [phone]   Fax:  [phone]  Name: Yvonne Salazar MRN: [id number] Date of Birth: Oct 13, 1991   Everardo Beals, PT 09/14/18 3:27 PM Phone: [phone] Fax: [phone]

## 2018-09-23 DIAGNOSIS — J9690 Respiratory failure, unspecified, unspecified whether with hypoxia or hypercapnia: Secondary | ICD-10-CM | POA: Diagnosis not present

## 2018-09-27 DIAGNOSIS — J9690 Respiratory failure, unspecified, unspecified whether with hypoxia or hypercapnia: Secondary | ICD-10-CM | POA: Diagnosis not present

## 2018-09-28 ENCOUNTER — Ambulatory Visit: Payer: 59 | Admitting: Physical Therapy

## 2018-10-12 ENCOUNTER — Ambulatory Visit: Payer: 59 | Attending: Pediatrics | Admitting: Physical Therapy

## 2018-10-12 ENCOUNTER — Encounter: Payer: Self-pay | Admitting: Physical Therapy

## 2018-10-12 DIAGNOSIS — M6281 Muscle weakness (generalized): Secondary | ICD-10-CM | POA: Diagnosis present

## 2018-10-12 DIAGNOSIS — R293 Abnormal posture: Secondary | ICD-10-CM | POA: Diagnosis present

## 2018-10-12 DIAGNOSIS — G7109 Other specified muscular dystrophies: Secondary | ICD-10-CM | POA: Diagnosis present

## 2018-10-12 NOTE — Therapy (Signed)
[facility] [place], [place], [address] Phone: [phone]   Fax:  [phone]  Pediatric Physical Therapy Treatment  Patient Details  Name: Yvonne Salazar MRN: [id number] Date of Birth: [birth year] No data recorded  Encounter date: 10/12/2018  End of Session - 10/12/18 1250    Visit Number  [id number]    Number of Visits  60    Date for PT Re-Evaluation  12/23/18    Authorization Type  UHC    Authorization Time Period  Next recertification due 12/23/18    Authorization - Visit Number  15   2019   Authorization - Number of Visits  60    PT Start Time  0824   arrived late   PT Stop Time  0900    PT Time Calculation (min)  36 min    Activity Tolerance  Patient tolerated treatment well    Behavior During Therapy  Willing to participate       History reviewed. No pertinent past medical history.  Past Surgical History:  Procedure Laterality Date  . FOOT CAPSULE RELEASE W/ PERCUTANEOUS HEEL CORD LENGTHENING, TIBIAL TENDON TRANSFER  2004  . scoliosis repair  2008  . TENNIS ELBOW RELEASE/NIRSCHEL PROCEDURE      There were no vitals filed for this visit.                Pediatric PT Treatment - 10/12/18 1247      Pain Assessment   Pain Scale  0-10    Pain Score  4     Pain Type  Chronic pain    Pain Location  Knee    Pain Orientation  Right    Pain Radiating Towards  ankle    Pain Descriptors / Indicators  Discomfort    Pain Frequency  Occasional    Pain Onset  With Activity   with ankle eversion stretch   Patients Stated Pain Goal  1    Pain Intervention(s)  Repositioned      Pain Comments   Pain Comments  K said her backside gets numb after sitting in same positions for long hours      Subjective Information   Patient Comments  K attended an art show she was invited to on 11/9. Did well, but "sore and tight" after the day.      PT Pediatric Exercise/Activities   Session  Observed by  Mom    Strengthening Activities  Ankle pumps after right ankle stretches; sit ups in w/c at 30 degrees with minimal assitance X 5 reps, X 4 trials (independent first trial)      Activities Performed   Core Stability Details  repositioned in w/c to 45 degree tilt and recline (seperate), can sit in this position for about 1 minute      Therapeutic Activities   Therapeutic Activity Details  Deep tissue massage at [facility] and traps bilaterally      ROM   Knee Extension(hamstrings)  stretched to end-range in w/c    Ankle DF  stretched into eversion and df after heel mobs, lateral, repetitive                Peds PT Short Term Goals - 09/14/18 1525      PEDS PT SHORT TERM GOAL #9   TITLE  [name] will report less frequent headaches (less than one week).     Status  On-going    Target Date  12/23/18  PEDS PT SHORT TERM GOAL #10   TITLE  [name] will be able to sit up from back of w/c when it is tilted more than 30 degrees.    Status  On-going    Target Date  12/23/18      PEDS PT SHORT TERM GOAL #11   TITLE  [name] will be able to determine self-massage options using hook massager to help with pain managment.    Status  Achieved       Peds PT Long Term Goals - 06/22/18 1220      PEDS PT  LONG TERM GOAL #1   Title  [name] will participate in chair exercises that she can perform independently for cardiovascular health.    Status  Achieved      PEDS PT  LONG TERM GOAL #2   Title  [name] will have two week span where her pain does not exceed 3/10.    Baseline  reports 6/10 today    Status  On-going    Target Date  12/23/18       Plan - 10/12/18 1251    Clinical Impression Statement  [name] needs to reposition in w/c for skin integrity, ROM and to promote changes in breathing patterns.  She is hesitant to do so, primarily because of her respiratory status.    PT plan  Continue PT every other week to increase K's endurance and strength and comfort (except next session  canceled for Thanksgiving).         Patient will benefit from skilled therapeutic intervention in order to improve the following deficits and impairments:  Decreased ability to maintain good postural alignment, Decreased ability to participate in recreational activities, Decreased function at home and in the community  Visit Diagnosis: Posture abnormality  Muscle weakness (generalized)  Ullrich congenital muscular dystrophy (HCC)   Problem List There are no active problems to display for this patient.   Alen Matheson 10/12/2018, 12:53 PM  [facility] [place], [place], [address] Phone: [phone]   Fax:  [phone]  Everardo BealsCarrie Venda Dice, PT 10/12/18 12:53 PM Phone: [phone] Fax: [phone]  Name: Harvin HazelKatherine G Destin MRN: [id number] Date of Birth: June 15, 1991

## 2018-10-24 DIAGNOSIS — J9690 Respiratory failure, unspecified, unspecified whether with hypoxia or hypercapnia: Secondary | ICD-10-CM | POA: Diagnosis not present

## 2018-10-28 DIAGNOSIS — J9690 Respiratory failure, unspecified, unspecified whether with hypoxia or hypercapnia: Secondary | ICD-10-CM | POA: Diagnosis not present

## 2018-11-09 ENCOUNTER — Ambulatory Visit: Payer: 59 | Attending: Pediatrics | Admitting: Physical Therapy

## 2018-11-09 ENCOUNTER — Encounter: Payer: Self-pay | Admitting: Physical Therapy

## 2018-11-09 DIAGNOSIS — G7109 Other specified muscular dystrophies: Secondary | ICD-10-CM

## 2018-11-09 DIAGNOSIS — M6281 Muscle weakness (generalized): Secondary | ICD-10-CM | POA: Diagnosis present

## 2018-11-09 DIAGNOSIS — R52 Pain, unspecified: Secondary | ICD-10-CM | POA: Diagnosis present

## 2018-11-09 DIAGNOSIS — R293 Abnormal posture: Secondary | ICD-10-CM | POA: Diagnosis present

## 2018-11-09 NOTE — Therapy (Signed)
[facility] [place], [place], [address] Phone: [phone]   Fax:  [phone]  Pediatric Physical Therapy Treatment  Patient Details  Name: Yvonne Salazar MRN: [id number] Date of Birth: December 18, 1990 No data recorded  Encounter date: 11/09/2018  End of Session - 11/09/18 0914    Visit Number  411    Number of Visits  60    Date for PT Re-Evaluation  12/23/18    Authorization Type  UHC    Authorization Time Period  Next recertification due 12/23/18    Authorization - Visit Number  16   2019   Authorization - Number of Visits  60    PT Start Time  0826    PT Stop Time  0906    PT Time Calculation (min)  40 min    Activity Tolerance  Patient tolerated treatment Salazar    Behavior During Therapy  Willing to participate       History reviewed. No pertinent past medical history.  Past Surgical History:  Procedure Laterality Date  . FOOT CAPSULE RELEASE W/ PERCUTANEOUS HEEL CORD LENGTHENING, TIBIAL TENDON TRANSFER  2004  . scoliosis repair  2008  . TENNIS ELBOW RELEASE/NIRSCHEL PROCEDURE      There were no vitals filed for this visit.                Pediatric PT Treatment - 11/09/18 0907      Pain Assessment   Pain Scale  0-10    Pain Score  2    during PT, but she says 8-10 first thing in am   Pain Type  Chronic pain;Neuropathic pain    Pain Location  Leg    Pain Orientation  Right;Left   left is more constant, right since Thanksgiving travel   Pain Radiating Towards  lower leg    Pain Descriptors / Indicators  Burning;Numbness;Nagging;Radiating;Shooting;Tingling    Pain Frequency  Several days a week    Pain Onset  Awakened from sleep    Patients Stated Pain Goal  0    Pain Intervention(s)  Repositioned;Distraction;Massage      Pain Comments   Pain Comments  K reports having stiffness and increased pain in right hip and leg since traveling over Thanksgiving; she reports  that nerve pain in right leg is more constant and definitely occurs daily      Subjective Information   Patient Comments  K enjoyed travelling for Thansgiving, but suffers from more stiffness after.  Family rented a [employer] officervan from [place] so they could travel with Yvonne Salazar and niece, Yvonne Salazar.      PT Pediatric Exercise/Activities   Session Observed by  Mom    Strengthening Activities  tilted back 30 degrees in chair; K could lift shoulders, but needs assistance to lift head, performed 5 reps    Self-care  massaged along calves and traps and below occiput      Activities Performed   Core Stability Details  reclined 30 degrees to start and then moved back to 20 degrees      ROM   Knee Extension(hamstrings)  stretched to end-ranges (for Yvonne Salazar) from w/c    Ankle DF  stretched right ankle into eversion and then reviewed with mom    Comment  distraction offered to hips (pump action)              Patient Education - 11/09/18 0913    Education Provided  Yes    Education  Description  reviewed right ankle eversion passive stretch with mom because Yvonne Salazar said, "She did it wrong."    Person(s) Educated  Mother;Patient    Method Education  Verbal explanation;Observed session;Demonstration    Comprehension  Returned demonstration       Peds PT Short Term Goals - 09/14/18 1525      PEDS PT SHORT TERM GOAL #9   TITLE  Yvonne Salazar will report less frequent headaches (less than one week).     Status  On-going    Target Date  12/23/18      PEDS PT SHORT TERM GOAL #10   TITLE  Yvonne Salazar will be able to sit up from back of w/c when it is tilted more than 30 degrees.    Status  On-going    Target Date  12/23/18      PEDS PT SHORT TERM GOAL #11   TITLE  Yvonne Salazar will be able to determine self-massage options using hook massager to help with pain managment.    Status  Achieved       Peds PT Long Term Goals - 06/22/18 1220      PEDS PT  LONG TERM GOAL #1   Title  Yvonne Salazar will participate in chair  exercises that she can perform independently for cardiovascular health.    Status  Achieved      PEDS PT  LONG TERM GOAL #2   Title  Yvonne Salazar will have two week span where her pain does not exceed 3/10.    Baseline  reports 6/10 today    Status  On-going    Target Date  12/23/18       Plan - 11/09/18 0915    Clinical Impression Statement  Yvonne Salazar has radiating pain in both legs, left more than right.  Her pain and discomfort is likely related to lack of range of motion and minimal postural and mobility options.  Pain control is challenging because Yvonne Salazar is very fearful about medication, and has no interest in surgery.    PT plan  Continue PT every other week to increase Yvonne Salazar's range of motion and promote postural changes to decrease impact of secondary impairments and contractures.  Next session cancelled for Christmas holiday closing.       Patient will benefit from skilled therapeutic intervention in order to improve the following deficits and impairments:  Decreased ability to maintain good postural alignment, Decreased ability to participate in recreational activities, Decreased function at home and in the community  Visit Diagnosis: Posture abnormality  Muscle weakness (generalized)  Pain  Ullrich congenital muscular dystrophy (HCC)   Problem List There are no active problems to display for this patient.   Yvonne Salazar 11/09/2018, 9:17 AM  [facility] [address] [place], [place], [address] Phone: [phone]   Fax:  [phone]  Name: Yvonne Salazar MRN: [id number] Date of Birth: 05-26-1991   Yvonne Salazar, PT 11/09/18 9:17 AM Phone: [phone] Fax: [phone]

## 2018-11-23 ENCOUNTER — Ambulatory Visit: Payer: 59 | Admitting: Physical Therapy

## 2018-11-23 DIAGNOSIS — J9690 Respiratory failure, unspecified, unspecified whether with hypoxia or hypercapnia: Secondary | ICD-10-CM | POA: Diagnosis not present

## 2018-11-27 DIAGNOSIS — J9690 Respiratory failure, unspecified, unspecified whether with hypoxia or hypercapnia: Secondary | ICD-10-CM | POA: Diagnosis not present

## 2018-12-07 ENCOUNTER — Encounter: Payer: Self-pay | Admitting: Physical Therapy

## 2018-12-07 ENCOUNTER — Ambulatory Visit: Payer: 59 | Attending: Pediatrics | Admitting: Physical Therapy

## 2018-12-07 DIAGNOSIS — R293 Abnormal posture: Secondary | ICD-10-CM | POA: Diagnosis present

## 2018-12-07 DIAGNOSIS — R52 Pain, unspecified: Secondary | ICD-10-CM

## 2018-12-07 DIAGNOSIS — G7109 Other specified muscular dystrophies: Secondary | ICD-10-CM | POA: Diagnosis present

## 2018-12-07 DIAGNOSIS — M6281 Muscle weakness (generalized): Secondary | ICD-10-CM | POA: Diagnosis present

## 2018-12-07 NOTE — Therapy (Signed)
[facility] [address] [place], [place], [address] Phone: [phone]   Fax:  [phone]  Pediatric Physical Therapy Treatment  Patient Details  Name: Yvonne Salazar Salazar Date of Birth: [birth year] No data recorded  Encounter date: 12/07/2018  End of Session - 12/07/18 1048    Visit Number  412    Number of Visits  60    Date for PT Re-Evaluation  12/23/18    Authorization Type  [employer]    Authorization Time Period  Next recertification due 12/23/18    Authorization - Visit Number  1   2020   Authorization - Number of Visits  60    PT Start Time  0820    PT Stop Time  0900    PT Time Calculation (min)  40 min    Activity Tolerance  Patient tolerated treatment well    Behavior During Therapy  Willing to participate       History reviewed. No pertinent past medical history.  Past Surgical History:  Procedure Laterality Date  . FOOT CAPSULE RELEASE W/ PERCUTANEOUS HEEL CORD LENGTHENING, TIBIAL TENDON TRANSFER  2004  . scoliosis repair  2008  . TENNIS ELBOW RELEASE/NIRSCHEL PROCEDURE      There were no vitals filed for this visit.                Pediatric PT Treatment - 12/07/18 1041      Pain Assessment   Pain Scale  0-10    Pain Score  3     Pain Type  Chronic pain    Pain Location  Hip    Pain Orientation  Right    Pain Descriptors / Indicators  Discomfort;Heaviness;Sore    Pain Frequency  Several days a week    Pain Onset  Other (Comment)   worse in am; always hurts with transfers   Patients Stated Pain Goal  1    Pain Intervention(s)  Massage;Relaxation;Traction      Pain Comments   Pain Comments  Yvonne Salazar said that pressure is bad when transferred in righ hip.  She notieces that she internally rotates more in right hip than she had historically.      Subjective Information   Patient Comments  Yvonne Salazar had a good Christmas, though she is worried she lost weight after bought of  possible food poisoning just before the holiday.      PT Pediatric Exercise/Activities   Session Observed by  Mom      Therapeutic Activities   Therapeutic Activity Details  Massaged quads, traps, scalenes and calves (mostly peroneals on right) for about 20 minutes of session.      ROM   Hip Abduction and ER  stretched both hips into as much ER as chair and Yvonne Salazar would allow; contract-relax by adducting then abducting    Knee Extension(hamstrings)  stretched to end-ranges (for Yvonne Salazar Salazar) from w/c    Ankle DF  stretched right ankle into eversion and dorsiflexion    Comment  distraction offfered to both hips; moved right foot back onto foot plate as Yvonne Salazar was allowing this to dangle              Patient Education - 12/07/18 1048    Education Provided  Yes    Education Description  asked Yvonne Salazar to use foam to seperate knees (and consider building this to a larger size if stretch is successful) and to keep right foot on foot plate    Person(s)  Educated  Mother;Patient    Method Education  Verbal explanation;Observed session;Demonstration    Comprehension  Verbalized understanding       Peds PT Short Term Goals - 09/14/18 1525      PEDS PT SHORT TERM GOAL #9   TITLE  Yvonne Salazar Salazar will report less frequent headaches (less than one week).     Status  On-going    Target Date  12/23/18      PEDS PT SHORT TERM GOAL #10   TITLE  Yvonne Salazar Salazar will be able to sit up from back of w/c when it is tilted more than 30 degrees.    Status  On-going    Target Date  12/23/18      PEDS PT SHORT TERM GOAL #11   TITLE  Yvonne Salazar Salazar will be able to determine self-massage options using hook massager to help with pain managment.    Status  Achieved       Peds PT Long Term Goals - 06/22/18 1220      PEDS PT  LONG TERM GOAL #1   Title  Yvonne Salazar Salazar will participate in chair exercises that she can perform independently for cardiovascular health.    Status  Achieved      PEDS PT  LONG TERM GOAL #2   Title  Yvonne Salazar Salazar will have two week span where  her pain does not exceed 3/10.    Baseline  reports 6/10 today    Status  On-going    Target Date  12/23/18       Plan - 12/07/18 1049    Clinical Impression Statement  Yvonne Salazar Salazar is limited in her postural options.  She has grown increasingly tight in right LE, and likely this has started at hip and pelvis, but is leading to tightness down to her ankle.   Yvonne Salazar Salazar benefits from postural instruction on how to minimize secondary impairments.      PT plan  Continue PT every other week to increase Yvonne Salazar's available range of motion and promote optimal posture to avoid worsening contractures and prevent further pain and discomfort.         Patient will benefit from skilled therapeutic intervention in order to improve the following deficits and impairments:  Decreased ability to maintain good postural alignment, Decreased ability to participate in recreational activities, Decreased function at home and in the community  Visit Diagnosis: Posture abnormality  Muscle weakness (generalized)  Pain  Ullrich congenital muscular dystrophy (HCC)   Problem List There are no active problems to display for this patient.   Yvonne Salazar Salazar 12/07/2018, 10:51 AM  [facility] [place] [address] [place], [place], [address] Phone: [phone]   Fax:  [phone]  Name: Yvonne Salazar Salazar MRN: [id number] Date of Birth: 01-Oct-1991   Everardo Beals, PT 12/07/18 10:51 AM Phone: [phone] Fax: [phone]

## 2018-12-21 ENCOUNTER — Ambulatory Visit: Payer: 59 | Admitting: Physical Therapy

## 2018-12-21 ENCOUNTER — Encounter: Payer: Self-pay | Admitting: Physical Therapy

## 2018-12-21 DIAGNOSIS — R52 Pain, unspecified: Secondary | ICD-10-CM

## 2018-12-21 DIAGNOSIS — R293 Abnormal posture: Secondary | ICD-10-CM

## 2018-12-21 DIAGNOSIS — G7109 Other specified muscular dystrophies: Secondary | ICD-10-CM

## 2018-12-21 DIAGNOSIS — M6281 Muscle weakness (generalized): Secondary | ICD-10-CM

## 2018-12-21 NOTE — Therapy (Signed)
[place], [place], [address] Phone: [phone]   Fax:  [phone]  Pediatric Physical Therapy Treatment  Patient Details  Name: Yvonne Salazar MRN: [id number] Date of Birth: 05-05-1991 No data recorded  Encounter date: 12/21/2018  End of Session - 12/21/18 1251    Visit Number  [id number]    Number of Visits  60    Date for PT Re-Evaluation  12/23/18    Authorization Type  UHC    Authorization Time Period  Next recertification due 0/17/51    Authorization - Visit Number  2   2020   Authorization - Number of Visits  60    PT Start Time  0817    PT Stop Time  0900    PT Time Calculation (min)  43 min    Activity Tolerance  Patient tolerated treatment well    Behavior During Therapy  Willing to participate       History reviewed. No pertinent past medical history.  Past Surgical History:  Procedure Laterality Date  . FOOT CAPSULE RELEASE W/ PERCUTANEOUS HEEL CORD LENGTHENING, TIBIAL TENDON TRANSFER  2004  . scoliosis repair  2008  . TENNIS ELBOW RELEASE/NIRSCHEL PROCEDURE      There were no vitals filed for this visit.                Pediatric PT Treatment - 12/21/18 1247      Pain Assessment   Pain Scale  0-10    Pain Score  4     Pain Type  Chronic pain    Pain Location  Hip    Pain Orientation  Right    Pain Descriptors / Indicators  Discomfort;Spasm;Tightness    Pain Onset  On-going    Patients Stated Pain Goal  1    Pain Intervention(s)  Repositioned;Other (Comment)   stabilizing     Pain Comments   Pain Comments  Yvonne Salazar said transfers first thing in the morning are always difficult, and she notices pain when leaning forward in w/c      Subjective Information   Patient Comments  Yvonne Salazar recently finished a linocut art piece that may be featured in a book about local artists.      PT Pediatric Exercise/Activities   Session Observed by  Mom    Strengthening  Activities  "curl up" from 30 degrees recline    Self-care  massaged bilateral neck/traps/occipital muscles      Activities Performed   Core Stability Details  tried altering w/c positioning, including forward tilt (up to 10 degrees) with seat back still reclined to 30 degrees      ROM   Hip Abduction and ER  stretched from 6.5 inches at knees (lateral aspect) to 8; rests at about 2    Ankle DF  stretched, with focus on right in inversion              Patient Education - 12/21/18 1251    Education Provided  Yes    Education Description  discussed need to readjust position frequenlty in w/c    Person(s) Educated  Mother;Patient    Method Education  Verbal explanation;Observed session;Demonstration    Comprehension  Verbalized understanding       Peds PT Short Term Goals - 12/21/18 1254      PEDS PT  SHORT TERM GOAL #1   Title  Yvonne Salazar will be able to tolerate anterior tilt of w/c to 10 degrees with her  back in no more than 10 degrees of recline for at least five minutes.    Baseline  If Yvonne Salazar's w/c is neutral or in 10 degrees of tilt, she has her back reclined to 30 degrees, and she respositions after about 2 minutes.      Time  6    Period  Months    Status  New    Target Date  06/21/19      PEDS PT  SHORT TERM GOAL #2   Title  Yvonne Salazar will tolerate having both arms stretched across her body to avoid worsening secondary impairment and tightness in bilateral shoulders.    Baseline  She cannot horizontally adduct either arm.      Time  6    Period  Months    Status  New    Target Date  06/21/19      PEDS PT  SHORT TERM GOAL #3   Title  Yvonne Salazar will be able to stretch knees to 8 inches apart sitting in w/c to avoid worsening hip tightness.    Baseline  Yvonne Salazar currently sits with knees two inches apart (lateral aspect of knee) and does not tolerate a stretch greater than 6 inches.    Time  6    Period  Months    Target Date  06/21/19      PEDS PT SHORT TERM GOAL #9   TITLE  Yvonne Salazar  will report less frequent headaches (less than one week).     Baseline  little change    Status  Not Met      PEDS PT SHORT TERM GOAL #10   TITLE  Yvonne Salazar will be able to sit up from back of w/c when it is tilted more than 30 degrees.    Status  Achieved      PEDS PT SHORT TERM GOAL #11   TITLE  Yvonne Salazar will be able to determine self-massage options using hook massager to help with pain managment.    Status  Achieved       Peds PT Long Term Goals - 12/21/18 1258      PEDS PT  LONG TERM GOAL #2   Title  Yvonne Salazar will have two week span where her pain does not exceed 3/10.    Status  On-going    Target Date  12/21/18       Plan - 12/21/18 1251    Clinical Impression Statement  Yvonne Salazar continues to have significant tightness throughout, and notes some increasing spasms/difficulty moving out of resting posture at right SCM with right shoulder elevated, and bilateral hip flexors and adductors, right more than left.  In w/c, Yvonne Salazar demonstrates pelvic obliquity with right femur further out beyond left knee.  She frequently dangles her right leg from foot rest due to increased right hip flexion, IR and adduction.  Yvonne Salazar has frequent headaches (a litte more than weekly) and daily pain in right hip.  She also has parasethesia along left leg.     PT plan  Recommend continuing PT every other week another six months to try and help Yvonne Salazar work on varying w/c position to allow for more function.  Today, she admits to rarely using anterior tilt feature in her power chair, and this could give her increased function.         Patient will benefit from skilled therapeutic intervention in order to improve the following deficits and impairments:  Decreased ability to maintain good postural alignment, Decreased ability to participate in recreational  activities, Decreased function at home and in the community  Visit Diagnosis: Posture abnormality - Plan: PT plan of care cert/re-cert  Muscle weakness (generalized) - Plan: PT  plan of care cert/re-cert  Pain - Plan: PT plan of care cert/re-cert  Ullrich congenital muscular dystrophy ([place]) - Plan: PT plan of care cert/re-cert   Problem List There are no active problems to display for this patient.   [place] 12/21/2018, 1:00 PM  [place], [place], [address] Phone: [phone]   Fax:  [phone]  Name: Yvonne Salazar MRN: [id number] Date of Birth: Jul 29, 1991   Lawerance Bach, [place] 12/21/18 1:00 PM Phone: [phone] Fax: [phone]

## 2018-12-24 DIAGNOSIS — J9690 Respiratory failure, unspecified, unspecified whether with hypoxia or hypercapnia: Secondary | ICD-10-CM | POA: Diagnosis not present

## 2018-12-28 DIAGNOSIS — J9690 Respiratory failure, unspecified, unspecified whether with hypoxia or hypercapnia: Secondary | ICD-10-CM | POA: Diagnosis not present

## 2019-01-04 ENCOUNTER — Ambulatory Visit: Payer: 59 | Admitting: Physical Therapy

## 2019-01-10 DIAGNOSIS — Q825 Congenital non-neoplastic nevus: Secondary | ICD-10-CM | POA: Diagnosis not present

## 2019-01-18 ENCOUNTER — Encounter: Payer: Self-pay | Admitting: Physical Therapy

## 2019-01-18 ENCOUNTER — Ambulatory Visit: Payer: 59 | Attending: Pediatrics | Admitting: Physical Therapy

## 2019-01-18 DIAGNOSIS — R52 Pain, unspecified: Secondary | ICD-10-CM | POA: Diagnosis present

## 2019-01-18 DIAGNOSIS — M6281 Muscle weakness (generalized): Secondary | ICD-10-CM

## 2019-01-18 DIAGNOSIS — G7109 Other specified muscular dystrophies: Secondary | ICD-10-CM

## 2019-01-18 DIAGNOSIS — R293 Abnormal posture: Secondary | ICD-10-CM | POA: Diagnosis present

## 2019-01-18 NOTE — Therapy (Addendum)
[place], [place], [address] Phone: [phone]   Fax:  [phone]  PHYSICAL THERAPY DISCHARGE SUMMARY  Visits from Start of Care: 60  Current functional level related to goals / functional outcomes: No change can be reported since last visit of 01/18/2019 as Yvonne Salazar has not returned since the COVID-19 pandemic started.     Remaining deficits: Yvonne Salazar is limited to movement in her power w/c and is dependent on caregivers for transfers.     Education / Equipment: Yvonne Salazar has a power w/c from [employer].    Plan: Patient agrees to discharge.  Patient goals were not met. Patient is being discharged due to not returning since the last visit.  ?????COVID-19 pandemic. Lawerance Bach, PT 09/13/19 2:03 PM Phone: [phone] Fax: [phone]       Pediatric Physical Therapy Treatment  Patient Details  Name: Yvonne Salazar MRN: [id number] Date of Birth: Oct 02, 1991 Referring Provider: Dr. Jonathon Jordan   Encounter date: 01/18/2019  End of Session - 01/18/19 1105    Visit Number  414    Number of Visits  60    Date for PT Re-Evaluation  12/23/18    Authorization Type  UHC    Authorization Time Period  Next recertification due 7/54/49    Authorization - Visit Number  3   2020   Authorization - Number of Visits  60    PT Start Time  0830   arrived late   PT Stop Time  0900    PT Time Calculation (min)  30 min    Activity Tolerance  Patient tolerated treatment well    Behavior During Therapy  Willing to participate       History reviewed. No pertinent past medical history.  Past Surgical History:  Procedure Laterality Date  . FOOT CAPSULE RELEASE W/ PERCUTANEOUS HEEL CORD LENGTHENING, TIBIAL TENDON TRANSFER  2004  . scoliosis repair  2008  . TENNIS ELBOW RELEASE/NIRSCHEL PROCEDURE      There were no vitals filed for this visit.                Pediatric PT Treatment -  01/18/19 1100      Pain Assessment   Pain Scale  0-10    Pain Score  8     Pain Type  Chronic pain    Pain Location  Hip    Pain Orientation  Right    Pain Descriptors / Indicators  Discomfort    Pain Frequency  Several days a week    Pain Onset  With Activity   mostly transfers   Patients Stated Pain Goal  2    Pain Intervention(s)  Repositioned;Massage    Multiple Pain Sites  Yes      2nd Pain Site   Pain Score  2    Wong-Baker Pain Rating  Hurts a little bit    Pain Type  Neuropathic pain    Pain Location  Leg    Pain Orientation  Left    Pain Radiating Towards  distal extremity    Pain Descriptors / Indicators  Discomfort;Burning;Numbness;Pins and needles;Shooting    Pain Frequency  Several days a week    Pain Onset  On-going    Patient's Stated Pain Goal  1    Pain Intervention(s)  Massage;Distraction;Relaxation      Pain Comments   Pain Comments  Yvonne Salazar said that they have had to change the way her mother transfers her because "folding" her  right leg closer to her body is "unbearable"      Subjective Information   Patient Comments  Yvonne Salazar had no new complaints, other than she wishes she could meet an orthopediatric specialist for MD to address her right hip and pelvis position ("without surgery")      PT Pediatric Exercise/Activities   Self-care  massaged at bilateral traps, rhomboids and along delts       Activities Performed   Core Stability Details  tilted anteriorly about 5 degrees X 3 trials for 1-5 minutes each with close supervision, encouraging reaching/tapping with either hand, and leaning head laterally to the left      Gross Motor Activities   Supine/Flexion  reclined chair for "rest breaks" and for change in pressure/position      ROM   Comment  stretched arms across body, seperately, held each for about 5 minutes              Patient Education - 01/18/19 1105    Education Provided  Yes    Education Description  discussed  need for posture relief, and  encouraged Yvonne Salazar to try anterior tilt with chest harness at home    Person(s) Educated  Mother;Patient    Method Education  Verbal explanation;Observed session;Demonstration    Comprehension  Verbalized understanding       Peds PT Short Term Goals - 12/21/18 1254      PEDS PT  SHORT TERM GOAL #1   Title  Yvonne Salazar will be able to tolerate anterior tilt of w/c to 10 degrees with her back in no more than 10 degrees of recline for at least five minutes.    Baseline  If [name] w/c is neutral or in 10 degrees of tilt, she has her back reclined to 30 degrees, and she respositions after about 2 minutes.      Time  6    Period  Months    Status  New    Target Date  06/21/19      PEDS PT  SHORT TERM GOAL #2   Title  Yvonne Salazar will tolerate having both arms stretched across her body to avoid worsening secondary impairment and tightness in bilateral shoulders.    Baseline  She cannot horizontally adduct either arm.      Time  6    Period  Months    Status  New    Target Date  06/21/19      PEDS PT  SHORT TERM GOAL #3   Title  Yvonne Salazar will be able to stretch knees to 8 inches apart sitting in w/c to avoid worsening hip tightness.    Baseline  Yvonne Salazar currently sits with knees two inches apart (lateral aspect of knee) and does not tolerate a stretch greater than 6 inches.    Time  6    Period  Months    Target Date  06/21/19      PEDS PT SHORT TERM GOAL #9   TITLE  Yvonne Salazar will report less frequent headaches (less than one week).     Baseline  little change    Status  Not Met      PEDS PT SHORT TERM GOAL #10   TITLE  Yvonne Salazar will be able to sit up from back of w/c when it is tilted more than 30 degrees.    Status  Achieved      PEDS PT SHORT TERM GOAL #11   TITLE  Yvonne Salazar will be able to determine self-massage options using  hook massager to help with pain managment.    Status  Achieved       Peds PT Long Term Goals - 12/21/18 1258      PEDS PT  LONG TERM GOAL #2   Title  Yvonne Salazar will have two week span where  her pain does not exceed 3/10.    Status  On-going    Target Date  12/21/18       Plan - 01/18/19 1106    Clinical Impression Statement  Yvonne Salazar feels insecure when anteriorly tilting in w/c and has to work core in a way that she does not typically.  Yvonne Salazar demonstrates fatigue when changing positions, and "locks into" resting postures (e.g. hooks right arm at head; allows right hip to IR).    PT plan  Continue PT every other week to increase Yvonne Salazar's postural variability and ROM options.  Promote comfort to improve quality of life.       Patient will benefit from skilled therapeutic intervention in order to improve the following deficits and impairments:  Decreased ability to maintain good postural alignment, Decreased ability to participate in recreational activities, Decreased function at home and in the community  Visit Diagnosis: Posture abnormality  Muscle weakness (generalized)  Pain  Ullrich congenital muscular dystrophy ([place])   Problem List There are no active problems to display for this patient.   Abdelrahman Nair 01/18/2019, 11:08 AM  [place], [place], [address] Phone: [phone]   Fax:  [phone]  Name: KRISTAL PERL MRN: [id number] Date of Birth: [birth year]   Lawerance Bach, PT 01/18/19 11:08 AM Phone: [phone] Fax: [phone]

## 2019-01-24 DIAGNOSIS — J9690 Respiratory failure, unspecified, unspecified whether with hypoxia or hypercapnia: Secondary | ICD-10-CM | POA: Diagnosis not present

## 2019-01-27 DIAGNOSIS — J9690 Respiratory failure, unspecified, unspecified whether with hypoxia or hypercapnia: Secondary | ICD-10-CM | POA: Diagnosis not present

## 2019-01-31 DIAGNOSIS — R05 Cough: Secondary | ICD-10-CM | POA: Diagnosis not present

## 2019-01-31 DIAGNOSIS — R6889 Other general symptoms and signs: Secondary | ICD-10-CM | POA: Diagnosis not present

## 2019-02-01 ENCOUNTER — Ambulatory Visit: Payer: 59 | Admitting: Physical Therapy

## 2019-02-15 ENCOUNTER — Ambulatory Visit: Payer: 59 | Admitting: Physical Therapy

## 2019-02-22 DIAGNOSIS — J9690 Respiratory failure, unspecified, unspecified whether with hypoxia or hypercapnia: Secondary | ICD-10-CM | POA: Diagnosis not present

## 2019-02-26 DIAGNOSIS — J9690 Respiratory failure, unspecified, unspecified whether with hypoxia or hypercapnia: Secondary | ICD-10-CM | POA: Diagnosis not present

## 2019-03-01 ENCOUNTER — Telehealth: Payer: Self-pay | Admitting: Physical Therapy

## 2019-03-01 ENCOUNTER — Ambulatory Visit: Payer: 59 | Admitting: Physical Therapy

## 2019-03-01 NOTE — Telephone Encounter (Signed)
Called left message that all appointments have been canceled with the temporary reduction of [facility] Re[facility] Services due to concerns for community transmission of Covid-19.    Recommended to call the office if interested in being contacted for an e-visit, virtual check in, or telehealth visit to continue their POC care, when those services become available.  [facility] will follow up with this client when we are able to safely resume care at the [facility] in person.   Family was made aware we can be reached by telephone during limited business hours in the meantime. 

## 2019-03-15 ENCOUNTER — Ambulatory Visit: Payer: 59 | Admitting: Physical Therapy

## 2019-03-25 DIAGNOSIS — J9690 Respiratory failure, unspecified, unspecified whether with hypoxia or hypercapnia: Secondary | ICD-10-CM | POA: Diagnosis not present

## 2019-03-29 ENCOUNTER — Ambulatory Visit: Payer: 59 | Admitting: Physical Therapy

## 2019-03-29 DIAGNOSIS — J9690 Respiratory failure, unspecified, unspecified whether with hypoxia or hypercapnia: Secondary | ICD-10-CM | POA: Diagnosis not present

## 2019-04-12 ENCOUNTER — Ambulatory Visit: Payer: 59 | Admitting: Physical Therapy

## 2019-04-24 DIAGNOSIS — J9690 Respiratory failure, unspecified, unspecified whether with hypoxia or hypercapnia: Secondary | ICD-10-CM | POA: Diagnosis not present

## 2019-04-26 ENCOUNTER — Ambulatory Visit: Payer: 59 | Admitting: Physical Therapy

## 2019-05-10 ENCOUNTER — Ambulatory Visit: Payer: 59 | Admitting: Physical Therapy

## 2019-05-24 ENCOUNTER — Ambulatory Visit: Payer: 59 | Admitting: Physical Therapy

## 2019-06-07 ENCOUNTER — Ambulatory Visit: Payer: 59 | Admitting: Physical Therapy

## 2019-06-21 ENCOUNTER — Ambulatory Visit: Payer: 59 | Admitting: Physical Therapy

## 2019-07-05 ENCOUNTER — Ambulatory Visit: Payer: 59 | Admitting: Physical Therapy

## 2019-07-19 ENCOUNTER — Ambulatory Visit: Payer: 59 | Admitting: Physical Therapy

## 2019-08-02 ENCOUNTER — Ambulatory Visit: Payer: 59 | Admitting: Physical Therapy

## 2019-08-16 ENCOUNTER — Ambulatory Visit: Payer: 59 | Admitting: Physical Therapy

## 2019-08-30 ENCOUNTER — Ambulatory Visit: Payer: 59 | Admitting: Physical Therapy

## 2019-09-13 ENCOUNTER — Ambulatory Visit: Payer: 59 | Admitting: Physical Therapy

## 2019-09-27 ENCOUNTER — Ambulatory Visit: Payer: 59 | Admitting: Physical Therapy

## 2019-10-11 ENCOUNTER — Ambulatory Visit: Payer: 59 | Admitting: Physical Therapy

## 2019-11-08 ENCOUNTER — Ambulatory Visit: Payer: 59 | Admitting: Physical Therapy

## 2019-11-22 ENCOUNTER — Ambulatory Visit: Payer: 59 | Admitting: Physical Therapy

## 2020-09-25 ENCOUNTER — Other Ambulatory Visit: Payer: Self-pay | Admitting: Family Medicine

## 2020-09-25 ENCOUNTER — Ambulatory Visit
Admission: RE | Admit: 2020-09-25 | Discharge: 2020-09-25 | Disposition: A | Discharge status: Home / Self Care | Payer: 59 | Source: Ambulatory Visit | Attending: Family Medicine | Admitting: Family Medicine

## 2020-09-25 ENCOUNTER — Other Ambulatory Visit: Payer: Self-pay

## 2020-09-25 DIAGNOSIS — T17908A Unspecified foreign body in respiratory tract, part unspecified causing other injury, initial encounter: Secondary | ICD-10-CM

## 2021-10-20 ENCOUNTER — Encounter: Payer: Self-pay | Admitting: Physical Medicine & Rehabilitation

## 2021-12-18 ENCOUNTER — Ambulatory Visit: Payer: 59 | Admitting: Physical Medicine & Rehabilitation

## 2022-01-29 ENCOUNTER — Other Ambulatory Visit: Payer: Self-pay

## 2022-01-29 ENCOUNTER — Encounter: Payer: Medicaid Other | Attending: Physical Medicine & Rehabilitation | Admitting: Physical Medicine & Rehabilitation

## 2022-01-29 ENCOUNTER — Encounter: Payer: Self-pay | Admitting: Physical Medicine & Rehabilitation

## 2022-01-29 VITALS — BP 122/82 | HR 99 | Ht 60.0 in

## 2022-01-29 DIAGNOSIS — G7109 Other specified muscular dystrophies: Secondary | ICD-10-CM

## 2022-01-29 MED ORDER — TRAMADOL HCL 50 MG PO TABS: 25.0000 mg | ORAL_TABLET | Freq: Four times a day (QID) | ORAL | 0 refills | Status: DC | PRN

## 2022-01-29 NOTE — Progress Notes (Signed)
? ?Subjective:  ? ? Patient ID: Yvonne Salazar, female    DOB: [birth year], 31 y.o.   MRN: [id number] ? ?HPI ?Left Foot pain  ?31 year old female with complicated history.  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  She has been wheelchair-bound for many years.  She has undergone thoracolumbar fusion for neuromuscular scoliosis. ?5-6 yr hx of insidious onset of Left foot and heel numbness, which is daily as well as more severe episodes which occur 1-2 x per month that involve pain in sitting bone all the way down to left foot, sometimes associated with straining at stool. ? ?Saw Dr Roetta Sessions at [facility] who performed EMG/NCV demonstrating no sensory nerve abnormalities but could not obtain motor conductions left lower extremity tibial and peroneal.  Suggested ESI but pt could not get positioned for the procedure ?Started on Gabapentin for this issue but pt does not think it helps ?Has new seating system, new motion has been the patient's provider. ? ? ?Pain Inventory ?Average Pain 6 ?Pain Right Now 4 ?My pain is intermittent, sharp, stabbing, and tingling ? ?In the last 24 hours, has pain interfered with the following? ?General activity 8 ?Relation with others 5 ?Enjoyment of life 6 ?What TIME of day is your pain at its worst? evening ?Sleep (in general) Good ? ?Pain is worse with: sitting and some activites ?Pain improves with: rest, heat/ice, and medication ?Relief from Meds: 7 ? ?how many minutes can you walk? 0 ?ability to climb steps?  no ?do you drive?  no ?use a wheelchair ?needs help with transfers ? 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 ?bladder control problems ?weakness ?numbness ?tingling ?trouble walking ?dizziness ?depression ?anxiety ? ?Any changes since last visit?  no ? ?Any changes since last  visit?  no ? ? ? ?Family History  ?Problem Relation Age of Onset  ? Hypertension Mother   ? ?Social History  ? ?Socioeconomic History  ? Marital status: Single  ?  Spouse name: Not on file  ? Number of children: Not on file  ? Years of education: Not on file  ? Highest education level: Not on file  ?Occupational History  ? Not on file  ?Tobacco Use  ? Smoking status: Never  ? Smokeless tobacco: Never  ?Vaping Use  ? Vaping Use: Never used  ?Substance and Sexual Activity  ? Alcohol use: Not Currently  ? Drug use: Never  ? Sexual activity: Not on file  ?Other Topics Concern  ? Not on file  ?Social History Narrative  ? Not on file  ? ?Social Determinants of Health  ? ?Financial Resource Strain: Not on file  ?Food Insecurity: Not on file  ?Transportation Needs: Not on file  ?Physical Activity: Not on file  ?Stress: Not on file  ?Social Connections: Not on file  ? ?Past Surgical History:  ?Procedure Laterality Date  ? FOOT CAPSULE RELEASE W/ PERCUTANEOUS HEEL CORD LENGTHENING, TIBIAL TENDON TRANSFER  2004  ? scoliosis repair  2008  ? TENNIS ELBOW RELEASE/NIRSCHEL PROCEDURE    ? ?History reviewed. No pertinent past medical history. ?BP 122/82   Pulse 99   Ht 5' (1.524 m)   SpO2 95%  ? ?Opioid Risk Score:   ?Fall Risk Score:  `1 ? ?Depression screen PHQ 2/9 ? ?Depression screen [facility] 2/9 01/29/2022  ?Decreased  Interest 0  ?Down, Depressed, Hopeless 1  ?PHQ - 2 Score 1  ?Altered sleeping 1  ?Tired, decreased energy 1  ?Change in appetite 0  ?Feeling bad or failure about yourself  0  ?Trouble concentrating 1  ?Moving slowly or fidgety/restless 0  ?Suicidal thoughts 0  ?PHQ-9 Score 4  ?Some recent data might be hidden  ?  ? ?Review of Systems  ?Constitutional: Negative.   ?HENT: Negative.    ?Eyes: Negative.   ?Respiratory: Negative.    ?Cardiovascular: Negative.   ?Gastrointestinal: Negative.   ?Endocrine: Negative.   ?Genitourinary: Negative.   ?Musculoskeletal:  Positive for back pain and gait problem.  ?Skin: Negative.    ?Allergic/Immunologic: Negative.   ?Neurological:  Positive for dizziness, weakness and numbness.  ?Hematological: Negative.   ?Psychiatric/Behavioral:  Positive for dysphoric mood. The patient is nervous/anxious.   ? ?   ?Objective:  ? Physical Exam ?Vitals and nursing note reviewed.  ?Constitutional:   ?   Appearance: She is ill-appearing.  ?HENT:  ?   Head: Normocephalic and atraumatic.  ?Musculoskeletal:     ?   General: Tenderness present. No swelling.  ?   Right lower leg: Edema present.  ?   Left lower leg: Edema present.  ?   Comments: Mild tenderness of the left ischial tuberosity.  The tuberosity is very distinct.  Little soft tissue covering on the left side although right side has much greater soft tissue coverage.  Patient has pelvic obliquity left iliac crest higher than right with upper body leaning toward the left side.  Seating system does have bolster left lateral  ?Skin: ?   Capillary Refill: Capillary refill takes 2 to 3 seconds. Normal pulses bilateral pedal and posterior tib ?   Comments: Bilateral lower extremity swelling cyanosis at nailbeds ecchymosis around distal toes  ?Neurological:  ?   General: No focal deficit present.  ?   Mental Status: She is alert and oriented to person, place, and time.  ?   Comments: Motor strength is 3 - right deltoid, 4 - bicep tricep 4 - finger flexors extensors wrist flexors and extensors ?Left upper extremity has no active shoulder or active elbow movement, 3 - wrist flexion extension finger flexion and extension ?Lower extremity strength ?Hip flexion 0 ?Hip adduction to minus hip AB duction to minus within available range ?Knee extension is 3 - ankle dorsiflexion plantarflexion 3 - ?Sensation ?Normal sensation in the upper extremities and right lower extremity ?Left lower extremity has absent light touch and proprioception below the ankle  ? ?Patient has shoulder abduction on the right side to 45 degrees flexion to 45 ? ?Minimal shoulder internal extra  rotation bilaterally ?Very limited bilateral shoulder Internal/external rotation is minimal Left shoulder abduction is nil as is left shoulder flexion. ?   ?Knee range of motion he can extend to -30 bilaterally.  There is minimal hip internal and external rotation as well as flexion she is fixed at a 90 degree angle between hip and pelvis bilaterally.  There is minimal abduction and adduction. ?Assessment & Plan:  ?#1.Left lower extremity pain with constant numbness in the left foot but with intermittent bouts of severe shooting pain along the sciatic nerve distribution.  This is most likely related to her pelvic obliquity with proximal sciatic compression around the ischial tuberosity. ?Will refer to wheelchair seating clinic evaluation and see if there is any ability to unload the left ischial area. ?For her Lattie Corns severe bouts of shooting pain will prescribe  tramadol 25 mg every 6 hours as needed she usually would only need to use this for 1 or 2 days/month. ?Discussed that while this is an opioid the risk for respiratory depression is  low ?Alternative treatments include nonsteroidal anti-inflammatories 4 to 600 mg ibuprofen every 6 hours as needed during these more significant bouts. ?The patient does not feel much relief with gabapentin but she would like to continue taking this medication as it helps with her anxiety. ?Physical medicine rehab follow-up in 6 to 8 weeks ?Complex case discussed with parents ?

## 2022-03-19 ENCOUNTER — Ambulatory Visit: Payer: Medicaid Other | Admitting: Physical Medicine & Rehabilitation

## 2022-04-23 ENCOUNTER — Encounter: Payer: Medicaid Other | Admitting: Physical Medicine & Rehabilitation

## 2022-05-13 ENCOUNTER — Ambulatory Visit: Payer: Medicaid Other

## 2022-06-07 ENCOUNTER — Ambulatory Visit: Payer: Medicaid Other | Attending: Family Medicine

## 2022-06-07 DIAGNOSIS — G7109 Other specified muscular dystrophies: Secondary | ICD-10-CM | POA: Insufficient documentation

## 2022-06-07 DIAGNOSIS — M6281 Muscle weakness (generalized): Secondary | ICD-10-CM | POA: Insufficient documentation

## 2022-06-07 NOTE — Therapy (Signed)
OUTPATIENT PHYSICAL THERAPY WHEELCHAIR EVALUATION   Patient Name: Yvonne Salazar MRN: [id number] DOB:02/08/1991, 31 y.o., female Today's Date: 06/07/2022   PT End of Session - 06/07/22 1424     Visit Number 1    Number of Visits 1    PT Start Time 1320    PT Stop Time 1415    PT Time Calculation (min) 55 min    Activity Tolerance Patient tolerated treatment well    Behavior During Therapy [facility] for tasks assessed/performed             History reviewed. No pertinent past medical history. Past Surgical History:  Procedure Laterality Date   FOOT CAPSULE RELEASE W/ PERCUTANEOUS HEEL CORD LENGTHENING, TIBIAL TENDON TRANSFER  2004   scoliosis repair  2008   TENNIS ELBOW RELEASE/NIRSCHEL PROCEDURE     There are no problems to display for this patient.   PCP: Jonathon Jordan, MD  REFERRING PROVIDER: Charlett Blake, MD   THERAPY DIAG:  Muscle weakness (generalized)  Rationale for Evaluation and Treatment Rehabilitation  SUBJECTIVE:                                                                                                                                                                                           SUBJECTIVE STATEMENT: Pt present or seating cushion evaluation with her current power chair as she is reporting increased pain and discomfort of nerve pain with recent weight gain.  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 No pertinent past medical history.          Cardio Status:  Functional Limitations: uses ventilator  [] Intact  [x]  Impaired      Respiratory Status:  Functional Limitations:   [] Intact  [x] Impaired   [] SOB [] COPD [] O2 Dependent ______LPM  [x] Ventilator Dependent  Resp equip:  Objective Measure(s):   Orthotics:   [] Amputee:                                                             [] Prosthesis:        HOME ENVIRONMENT:  [x] House [] Condo/town home [] Apartment [] Asst living [] LTCF         [x] Own  [] Rent   [] Lives alone [x] Lives with others -  parents                           Hours without assistance: 2 hours  [x] Home is accessible to patient                                 Storage of wheelchair:  [x] In home   [] Other Comments:        COMMUNITY :  TRANSPORTATION:  [] Car [x] Van [] Public Transportation [x] Adapted w/c Lift []  Ambulance [] Other:                     [] Sits in wheelchair during transport   Where is w/c stored during transport?  [] [place]  []  [place]  r   [] Self-Driver       Drive while in  [address] [x] yes [] no   Employment and/or school:  Specific requirements pertaining to mobility  Self employed: Radio broadcast assistant        Other:  COMMUNICATION:  Verbal Communication  [x] WFL [] receptive [] WFL [] expressive [] Understandable  [] Difficult to understand  [] non-communicative  Primary Language:______________ 2nd:_____________  Communication provided by:[] Patient [] Family [] Caregiver [] Translator   [] Uses an Training and development officer device     Manufacturer/Model :                                                                MOBILITY/BALANCE:  Sitting Balance  Standing Balance  Transfers  Ambulation   [] WFL      [] WFL  [] Independent  []  Independent   [] Uses UE for balance i[] n sitting  Comments:  [] Uses UE/device for stability Comments:  []  Min assist  []  Ambulates independently with       device:___________________      []  Mod assist  []  Able to ambulate ______ feet        safely/functionally/independently   []  Min assist  []  Min assist  []  Max assist  []  Non-functional ambulator         History/High risk of falls   []  Mod assist  []  Mod assist  [x]  Dependent  [x]  Unable to ambulate   [x]  Max  assist  []  Max assist  Transfer method:r1 person r2 person rsliding board rsquat pivot rstand pivot rmechanical patient lift  rother:   []  Unable  [x]  Unable    Fall History: # of falls in the past 6 months? 0 # of "near" falls in the past 6 months? 0    CURRENT SEATING / MOBILITY:  Current Mobility Device: [] None [] Cane/Walker [] Manual [] Dependent [] Dependent w/ Tilt rScooter [x] Power (type of control):  left   Manufacturer: Permobile Model: M3 Serial #:   Size:  Color:  Age: 11  Purchased by whom: Medicaid/[employer]HC  Current condition of mobility base:    Current seating system:          She is outgrown                                          Age of seating system:  +2 years  Describe posture in present seating system:    Is the current mobility meeting medical necessity?:  [] Yes [x] No Describe: gained 25 lbs in last year                                    Ability to complete Mobility-Related Activities of Daily Living (MRADL's) with Current Mobility Device:   Move room to room  [x] Independent  [] Min [] Mod [] Max assist  [] Unable  Comments:   Meal prep  [] Independent  [] Min [] Mod [] Max assist  [x] Unable    Feeding  [] Independent  [x] Min [] Mod [] Max assist  [] Unable    Bathing  [] Independent  [] Min [] Mod [] Max assist  [x] Unable    Grooming  [] Independent  [] Min [] Mod [] Max assist  [x] Unable    UE dressing  [] Independent  [] Min [] Mod [] Max assist  [x] Unable    LE dressing  [] Independent   [] Min [] Mod [] Max assist  [x] Unable    Toileting  [] Independent  [] Min [] Mod [] Max assist   [x] Unable    Bowel Mgt: [x]  Continent []  Incontinent []  Accidents []  Diapers []  Colostomy  Bowel Program:  Bladder Mgt: [x]  Continent []  Incontinent []  Accidents []  Diapers []  Urinal []  Intermittent Cath []  Indwelling Cath []  Supra-pubic Cath     Current Mobility Equipment Trialed/ Ruled Out:    Does not meet mobility needs due to:    Mark all boxes that indicate inability to[]  use the specific equipment listed     Meets needs for safe  independent functional  ambulation  / mobility    Risk of  Falling or History of Falls    Enviromental limitations      Cognition    Safety concerns with  physical ability    Decreased / limitations endurance  & strength     Decreased / limitations  motor skills  & coordination    Pain    Pace /  Speed    Cardiac and/or  respiratory condition    Contra - indicated by diagnosis   Cane/Crutches  []   []   []   []   []   []   []   []   []   []   []    Walker / Rollator  [x]  NA   []   []   []   []   []   []   []   []   []   []   []     Manual Wheelchair K0001-K0007:  [x]  NA  []   []   []   []   []   []   []   []   []   []   []    Manual W/C (K0005) with power assist  [x]  NA  []   []   []   []   []   []   []   []   []   []   []    Scooter  [x]  NA  []   []   []   []   []   []   []   []   []   []   []   Power Wheelchair: standard joystick  []  NA  [x]   []   []   []   []   []   []   []   []   []   []    Power Wheelchair: alternative controls  [x]  NA  []   []   []   []   []   []   []   []   []   []   []    Summary:  The least costly alternative for independent functional mobility was found to be:    []  Crutch/Cane  []  Walker []  Manual w/c  []  Manual w/c with power assist   []  Scooter   [x]  Power w/c std joystick   []  Power w/c alternative control        []  Requires dependent care mobility device   Media planner for [employer] skills are adequate for safe mobility equipment operation  [x]   Yes []   No  Patient is willing and motivated to use recommended mobility equipment  [x]   Yes []   No        []  Patient is unable to safely operate mobility equipment independently and requires dependent care equipment Comments:           SENSATION and SKIN ISSUES:  Sensation []  Intact  []  Impaired []  Absent []  Hyposensate [x]  Hypersensate  []  Defensiveness  Location(s) of impairment: tailbone, left ischial tuberosity   Pressure Relief Method(s):  [x]  Lean side to side to offload (without risk of falling)  []   W/C push up (4+ times/hour for 15+ seconds) []  Stand up (without risk of falling)    [x]  Other: (Describe): power tilt and recline function Effective pressure relief method(s) above can be performed consistently throughout the day: rYes  r No If not, Why?:  Skin Integrity Risk:       []  Low risk           []  Moderate risk            [x]  High risk  If high risk, explain:   Skin Issues/Skin Integrity  Current skin Issues  []  Yes [x]  No []  Intact  []   Red area   []   Open area  []  Scar tissue  []  At risk from prolonged sitting  Where: History of Skin Issues  [x]  Yes []  No Where : tailbone, L ischial tuberosities When: 2018 Stage: Hx of skin flap surgeries  []  Yes [x]  No Where:  When:  Pain: [x]  Yes []  No   Pain Location(s): L leg/L foot Intensity scale: (0-10) : 9-10/10 How does pain interfere with mobility and/or MRADLs? - difficulty with sitting        MAT EVALUATION:  Neuro-Muscular Status: (Tone, Reflexive, Responses, etc.)     [x]   Intact   []  Spasticity:  []  Hypotonicity  []  Fluctuating  []  Muscle Spasms  []  Poor Righting Reactions/Poor Equilibrium Reactions  []  Primal Reflex(s):    Comments:            COMMENTS:    POSTURE:     Comments:  Pelvis Anterior/Posterior:  [x]  Neutral   []  Posterior  []  Anterior  []  Fixed - No movement []  Tendency away from neutral []  Flexible []  Self-correction []  External correction Obliquity (viewed from front)  []  WFL []  R Obliquity [x]  L Obliquity  []  Fixed - No movement []  Tendency away from  neutral []  Flexible []  Self-correction []  External correction Rotation  []  WFL [x]  R anterior []  L anterior  []  Fixed - No movement []  Tendency away from neutral []  Flexible []   Self-correction []  External correction Tonal Influence Pelvis:  [x]  Normal []  Flaccid []  Low tone []  Spasticity []  Dystonia []  Pelvis thrust []  Other:    Trunk Anterior/Posterior:  []  WFL []  Thoracic kyphosis []  Lumbar lordosis  [x]  Fixed - No movement [x]  Tendency away from neutral- to the left []  Flexible []  Self-correction []  External correction  []  WFL []  Convex to left  []  Convex to right [x]  S-curve   []  C-curve [x]  Multiple curves [x]  Tendency away from neutral- L lean []  Flexible []  Self-correction []  External correction Rotation of shoulders and upper trunk:  [x]  Neutral []  Left-anterior []  Right- anterior []  Fixed- no movement []  Tendency away from neutral []  Flexible []  Self correction []  External correction Tonal influence Trunk:  [x]  Normal []  Flaccid []  Low tone []  Spasticity []  Dystonia []  Other:   Head & Neck  []  Functional []  Flexed    []  Extended []  Rotated right  []  Rotated left [x]  Laterally flexed right []  Laterally flexed left []  Cervical hyperextension   []  Good head control [x]  Adequate head control []  Limited head control []  Absent head control Describe tone/movement of head and neck: She uses lateral facial component to support head in neutral position and to reduce R lateral flexion of the head/neck     Lower Extremity Measurements: LE ROM:  Active/Passive ROM Right 06/07/2022 Left 06/07/2022  Hip flexion 0/90 0/90  Hip extension    Hip abduction    Hip adduction    Knee flexion    Knee extension -75/-70 -60/-60  Ankle dorsiflexion -5/-5 0/0  Ankle plantarflexion     (Blank rows = not tested)  LE MMT:  MMT Right 06/07/2022 Left 06/07/2022  Hip flexion 1+ 1+  Hip extension    Hip abduction 1+ 1+  Hip adduction 1+ 1+  Knee  flexion 1+ 1+  Knee extension 2+ 2+  Ankle dorsiflexion 1+ 1+  Ankle plantarflexion 1+ 1+   (Blank rows = not tested)  Hip positions:  [x]  Neutral   []  Abducted   []  Adducted  []  Subluxed   []  Dislocated   [x]  Fixed   []  Tendency away from neutral []  Flexible []  Self-correction []  External correction   Hip Windswept:[]  Neutral  [x]  Right    []  Left  []  Subluxed   []  Dislocated   [x]  Fixed   []  Tendency away from neutral []  Flexible []  Self-correction []  External correction  LE Tone: [x]  Normal []  Low tone []  Spasticity []  Flaccid []  Dystonia []  Rocks/Extends at hip []  Thrust into knee extension []  Pushes legs downward into footrest  Foot positioning: ROM Concerns: Dorsiflexed: []  Right   []  Left Plantar flexed: [x]  Right    [x]  Left Inversion: []  Right    [x]  Left Eversion: [x]  Right    []  Left  LE Edema: []  1+ (Barely detectable impression when finger is pressed into skin) [x]  2+ (slight indentation. 15 seconds to rebound)- L foot only []  3+ (deeper indentation. 30 seconds to rebound) []  4+ (>30 seconds to rebound)  UE Measurements:  UPPER EXTREMITY ROM:   Active ROM Right 06/07/2022 Left 06/07/2022  Shoulder Scaption 95 20  Shoulder abduction    Shoulder adduction    Elbow flexion 83 130  Elbow extension -70 -80  Wrist flexion WNL WNL  Wrist extension WNL WNL  (Blank rows = not tested)  UPPER EXTREMITY MMT:  MMT Right 06/07/2022 Left 06/07/2022  Shoulder scaption 3+ 1+  Shoulder abduction    Shoulder adduction  Elbow flexion 3+ 2+  Elbow extension 2+ 3+  Wrist flexion    Wrist extension    Pinch strength    Grip strength    (Blank rows = not tested)  Shoulder Posture:  Right Tendency towards Left  []   Functional []    [x]   Elevation [x]    []   Depression []    []   Protraction []    []   Retraction []    [x]   Internal rotation [x]    []   External rotation []    []   Subluxed []     UE Tone: [x]  Normal []  Flaccid []  Low tone []  Spasticity   []  Dystonia []  Other:   UE Edema: [x]  1+ (Barely detectable impression when finger is pressed into skin) []  2+ (slight indentation. 15 seconds to rebound) []  3+ (deeper indentation. 30 seconds to rebound) []  4+ (>30 seconds to rebound)  Wrist/Hand: Handedness: [x]  Right   []  Left   []  NA: Comments:  Right  Left  []   WNL []    []   Limitations []    [x]   Contractures [x]    []   Fisting []    []   Tremors []    [x]   Weak grasp [x]    [x]   Poor dexterity [x]    []   Hand movement non functional []    []   Paralysis []         MOBILITY BASE RECOMMENDATIONS and JUSTIFICATION:  MOBILITY BASE  JUSTIFICATION   Manufacturer:    Model:                              Color:  Seat Width:   Seat Depth    []  Manual mobility base (continue below)   []  Scooter/POV  []  Power mobility base   Number of hours per day spent in above selected mobility base:   Typical daily mobility base use Schedule:    []  is not a safe, functional ambulator  []  limitation prevents from completing a MRADL(s) within a reasonable time frame    []  limitation places at high risk of morbidity or mortality secondary to  the attempts to perform a    MRADL(s)  []  limitation prevents accomplishing a MRADL(s) entirely  []  provide independent mobility  []  equipment is a lifetime medical need  []  walker or cane inadequate  []  any type manual wheelchair      inadequate  []  scooter/POV inadequate      []  requires dependent mobility          MANUAL MOBILITY      []  Standard manual wheelchair  K0001      Arm:    []  both []  right  []  left      Foot:   []  both []  right   []  left  []  self-propels wheelchair  []  will use on regular basis  []  chair fits throughout home  []  willing and motivated to use  []  propels with assistance     []  dependent use   []  Standard hemi-manual wheelchair  K0002      Arm:    []  both []  right  []  left      Foot:   []  both []  right   []  left  []  lower seat height required to foot      propel   []  short stature  []  self-propels wheelchair  []  will use on regular basis  []  chair fits throughout home  []  willing and motivated to use   []   propels with assistance  []  dependent use   []  Lightweight manual wheelchair  K0003      Arm:    []  both []  right  []  left      Foot:   []  both  []  right  []  left                   []  hemi height required  []  medical condition and weight of  wheelchair affect ability to self      propel standard manual wheelchair in the residence  []  can and does self-propel (marginal propulsion skills)  []  daily use _________hours  []  chair fits throughout home  []  willing and motivated to use  []  lower seat height required to foot propel  []  short stature   []  High strength lightweight manual  wheelchair (Breezy Ultra 4)  K0004     Arm:    []  both []  right  []  left     Foot:   []  both []  right   []  left                                                                  []  hemi height required []  medical condition and weight of wheelchair affect ability to self propel while engaging in frequent MRADL(s) that cannot be performed in a standard or lightweight manual wheelchair  []  daily use _________hours  []  chair fits throughout home  []  willing and motivated to use  []  prevent repetitive use injuries   []  lower seat height required to foot propel  []  short stature    []  Ultra-lightweight manual wheelchair  K0005     Arm:    []  both []  right  []  left     Foot:   []  both []  right  []  left      []  []  hemi height required  []  heavy duty    Front seat to floor _____ inches      Rear seat to floor _____ inches      Back height _____ inches     Back angle ______ degrees      Front angle _____ degrees  []   full-time manual wheelchair user  []  Requires individualized fitting and optimal adjustments for multiple features that include adjustable axle configuration, fully adjustable center of gravity, wheel camber, seat and back angle, angle of seat slope,  which cannot be accommodated by a K0001 through K0004 manual wheelchair  []  prevent repetitive use injuries  []  daily use_________hours   []  user has high activity patterns that frequently require  them  to go out into the community for the purpose of independently accomplishing high level MRADL activities. Examples of these might include a combination of; shopping, work, school, Science writer, childcare, independently loading and unloading from a vehicle etc.  []  lower seat height required to foot propel  []  short stature  []  heavy duty -  weight over 250lbs   []  Current chair is a K0005   manufacture:___________________  model:_________________  serial#____________________  age:_________    []  First time [id number] user (complete trial)  K0004 time and # of strokes to propel 30 feet: ________seconds _________strokes  [id number] time and # of strokes to propel 30 feet: ________seconds _________strokes  What was  the result of the trial between the K0004 and K0005 manual wheelchair? ___    What features of the K0005 w/c are needed as compared to the K0004 base? Why?___    []  adjustable seat and back angle changes the angle of seat slope of the frame to attain a gravity assisted position for efficient propulsion and proper weight distribution along the frame     []  the front of the wheelchair will be configured higher than the back of the chair to allow gravity to assist the user with postural stability  []  the center of the wheel will be positioned for stability, safety and efficient propulsion  []  adjustable axle allows for vertical, horizontal, camber and overall width changes  throughout the wheels for adjustment of the client's exact needs and abilities.   []  adjustable axle increases the stability and function of the chair allowing for adjustment of the center of gravity.   []  accommodates the client's anatomical position in the chair maximizing independence in mobility and maneuverability in all  environments.   []  create a minimal fixed tilt-in space to assist in positioning.   []  Describe users full-time manual wheelchair activity patterns:___    []  Power assist Comments:  []  prevent repetitive use injuries  []  repetitive strain injury present in    shoulder girdle    []  shoulder pain is (> or =) to 7/10     during manual propulsion       Current Pain _____/10  []  requires conservation of energy to participate in MRADL(s) runable to propel up ramps or curbs using manual wheelchair  []  been K0005 user greater than one year  []  user unwilling to use power      wheelchair (reason): []  less expensive option to power   wheelchair   []  rim activated power assist -      decreased strength   []  Heavy duty manual wheelchair       K0006     Arm:    []  both []  right  []  left     Foot:   []  both []  right  []  left     []  hemi height required    []  Dependent base  []  user exceeds 250lbs  []  non-functional ambulator    []  extreme spasticity  []  over active movement   []  broken frame/hx of repeated     repairs  []  able to self-propel in residence       []  lower seat to floor height required  []  unable to self-propel in residence   []  Extra heavy duty manual wheelchair  K0007     Arm:    []  both []  right  []  left     Foot:   []  both []  right  []  left     []  hemi height required  []  Dependent base  []  user exceeds 300lbs  []  non-functional ambulator    []  able to self-propel in residence   []  lower seat to floor height required  []  unable to self-propel in residence     []  Manual wheelchair with tilt [phone]      (Manual "Tilt-n-Space")  []  patient is dependent for transfers  []  patient requires frequent       positioning for pressure relief   []  patient requires frequent      positioning for poor/absent trunk control        []  Stroller Base  []  infant/child   []  unable to propel manual  wheelchair  []  allows for growth  []  non-functional ambulator  []  non-functional UE  []   independent mobility is not a goal at this time    [place] handles  []  extended  rangle adjustable   []  standard  []  caregiver access  []  caregiver assist    []  allows "hooking" to enable      increased ability to perform ADLs or maintain balance   []  Angle Adjustable Back  []  postural control  []  control of tone/spasticity  []  accommodation of range of motion  []  UE functional control  []  accommodation for seating system    Rear wheel placement  []  std/fixed rfully adjustableramputee   []  camber ________degree  []  removable rear wheel  []  non-removable rear wheel  Wheel size _______  Wheel style_______________________  []  improved UE access to wheels  []  increase propulsion ability  []  improved stability  []  changing angle in space for      improvement of postural stability  []  remove for transport    []  allow for seating system to fit on      base  []  amputee placement  []  1-arm drive access   r R  r L  []  enable propulsion of manual       wheelchair with one arm    []  amputee placement   Wheel rims/ Hand rims  []  Standard    []  Specialized-____ []  provide ability to propel manual   []  increase self-propulsion with hand wheelchair weakness/decreased grasp     []  Spoke protector/guard   []  prevent hands from getting caught in spokes   Tires:  []  pneumatic  []  flat free inserts  []  solid  Style:  []  decrease roll resistance              []  prevent frequent flats  []  increase shock absorbency  []  decrease maintenance   []  decrease pain from road shock    []  decrease spasms from road shock    Wheel Locks:    []  push []  pull []  scissor  []  lock wheels for transfers  []  lock wheels from rolling   Brake/wheel lock extension:  []  R  []  L  []  allow user to operate wheel locks due to decreased reach or strength   [place] housing:  Development worker, international aid size:                      Style:                                          []  suspension fork  []  maneuverability    []  stability of wheelchair   []  durability  []  maintenance  []  angle adjustment for posture  []  allow for feet to come under        wheelchair base  []  allows change in seat to floor      height   []  increase shock absorbency  []  decrease pain from road shock  []  decrease spasms from road    shock   []  Side guards  []  prevent clothing getting caught in      wheel or becoming soiled  rprovide hip and pelvic stability  []  eliminates contact between body and wheels  []  limit hand contact with wheels   []  Anti-tippers      []   prevent wheelchair from tipping    backward  []  assist caregiver with curbs     POWER MOBILITY      []  Scooter/POV    []  can safely operate   []  can safely transfer   []  has adequate trunk stability   []  cannot functionally propel  manual wheelchair    []  Power mobility base    []  non-ambulatory   []  cannot functionally propel manual wheelchair   []  cannot functionally and safely      operate scooter/POV  []  can safely operate power       wheelchair  []  home is accessible  []  willing to use power wheelchair     Tilt  []  Powered tilt on powered chair  []  Powered tilt on manual chair []  Manual tilt on manual chair Comments:  []  change position for pressure      []  elief/cannot weight shift   []  change position against      gravitational force on head and      shoulders   []  decrease pain  []  blood pressure management   []  control autonomic dysreflexia  []  decrease respiratory distress  []  management of spasticity  []  management of low tone  []  facilitate postural control   []  rest periods   []  control edema  []  increase sitting tolerance   []  aid with transfers     Recline   []  Power recline on power chair  []  Manual recline on manual chair  Comments:    []  intermittent catheterization  []  manage spasticity  []  accommodate femur to back angle  []  change position for pressure relief/cannot weight shift rhigh risk of pressure sore development  []   tilt alone does not accomplish     effective pressure relief, maximum pressure relief achieved at -     _______ degrees tilt       _______ degrees recline   []  difficult to transfer to and from bed []  rest periods and sleeping in chair  []  repositioning for transfers  []  bring to full recline for ADL care  []  clothing/diaper changes in chair  []  gravity PEG tube feeding  []  head positioning  []  decrease pain  []  blood pressure management   []  control autonomic dysreflexia  []  decrease respiratory distress  []  user on ventilator     Elevator on mobility base  []  Power wheelchair  []  Scooter  []  increase Indep in transfers   []  increase Indep in ADLs    []  bathroom function and safety  []  kitchen/cooking function and safety  []  shopping  []  raise height for communication at standing level  []  raise height for eye contact which reduces cervical neck strain and pain  []  drive at raised height for safety and navigating crowds  []  Other:   []  Vertical position system (anterior tilt)     (Drive locks-out)    []  Stand       (Drive enabled)  []  independent weight bearing  []  decrease joint contractures  []  decrease/manage spasticity  []  decrease/manage spasms  []  pressure distribution away from   scapula, sacrum, coccyx, and ischial tuberosity  []  increase digestion and elimination   []  access to counters and cabinets  []  increase reach  []  increase interaction with others at eye level, reduces neck strain  []  increase performance of       MRADL(s)      Power elevating legrest    []  Center mount (Single) 85-170 degrees       []   Standard (Pair) 100-170 degrees  []  position legs at 90 degrees, not available with std power ELR  []  center mount tucks into chair to decrease turning radius in home, not available with std power ELR  []  provide change in position for LE  []  elevate legs during recline    []  maintain placement of feet on      footplate  []  decrease edema  []  improve  circulation  []  actuator needed to elevate legrest  []  actuator needed to articulate legrest preventing knees from flexing  []  Increase ground clearance over      curbs  []   STD (pair) independently                     elevate legrest   POWER WHEELCHAIR CONTROLS      Controls/input device  []  Expandable  []  Non-expandable  []  Proportional  []  Right Hand []  Left Hand  []  Non-proportional/switches/head-array  []  Electrical/proximity         []   Mechanical      Manufacturer:___________________   Type:________________________ []  provides access for controlling wheelchair  []  programming for accurate control  []  progressive disease/changing condition  []  required for alternative drive      controls       []  lacks motor control to operate  proportional drive control  []  unable to understand proportional controls  []  limited movement/strength  []  extraneous movement / tremors / ataxic / spastic       []  Upgraded electronics      controller/harness    []  Single power (tilt or recline)   []  Expandable    []  Non-expandable plus   []  Multi-power (tilt, recline, power legrest, power seat lift, vertical positioning system, stand)  []  allows input device to communicate with drive motors  []  harness provides necessary     connections between the controller, input device, and seat functions     []  needed in order to operate   power seat functions through    joystick/ input device  []  required for alternative drive      controls     []  Enhanced display  []  required to connect all alternative drive controls   []  required for upgraded joystick      (lite-throw, heavy duty, micro)  []  Allows user to see in which mode and drive the wheelchair is set; necessary for alternate controls       []  Upgraded tracking electronics  []  correct tracking when on uneven surfaces makes switch driving more efficient and less fatiguing  []  increase safety when driving  []  increase ability to traverse       thresholds    []  Safety / reset / mode switches     Type:    []  Used to change modes and stop the wheelchair when driving     []  Mount for joystick / input device/switches  []  swing away for access or      transfers   []  attaches joystick / input device / switches to wheelchair   []  provides for consistent access  []  midline for optimal placement    []  Attendant controlled joystick plus     mount  []  safety  []  long distance driving  []  operation of seat functions  []  compliance with transportation regulations    []  Battery  []  required to power (power assist / scooter/ power wc / other):       []  Power inverter (24V to 12V)  []   required for ventilator / respiratory equipment / other:     [place]   []  adjustable height rremovable  []  swing away []  fixed  []  flip back  []  reclining  []  full length pads []  desk []  tube arms []  gel pads  []  provide support with elbow at 90    []  remove/flip back/swing away for  transfers  []  provide support and positioning of upper body    []  allow to come closer to table top  []  remove for access to tables  []  provide support for w/c tray  []  change of height/angles for       variable activities   []  Elbow support / Elbow stop  []  keep elbow positioned on arm pad  []  keep arms from falling off arm pad      during tilt and/or recline   Upper Extremity Support  []  Arm trough  []   R  []   L  Style:  []  swivel mount []  fixed mount   []  posterior hand support  []   tray  []  full tray  []  joystick cut out  []   R  []   L  Style:  []  decrease gravitational pull on      shoulders  []  provide support to increase UE  function  []  provide hand support in natural    position  []  position flaccid UE  []  decrease subluxation    rdecrease edema       []  manage spasticity   []  provide midline positioning  []  provide work surface  []  placement for AAC/ Computer/ EADL       Hangers/ Legrests   []  ______ degree  []   Elevating []  articulating  []  swing away []  fixed []  lift off  []  heavy duty []  adjustable knee angle  []  adjustable calf panel   []  longer extension tube              []  provide LE support  []  maintain placement of feet on      footplate   []  accommodate lower leg length  []  accommodate to hamstring       tightness  []  enable transfers  []  provide change in position for LE's  []  elevate legs during recline    []  decrease edema  []  durability      Foot support   []  footplate []  R []  L []  flip up           []  Depth adjustable   []  angle adjustable  []  foot board/one piece    []  provide foot support  []  accommodate to ankle ROM  []  allow foot to go under wheelchair base  []  enable transfers     []  Shoe holders  []  position foot    []  decrease / manage spasticity  []  control position of LE  []  stability    []  safety     []  Ankle strap/heel      loops  []  support foot on foot support  []  decrease extraneous movement  []  provide input to heel   []  protect foot     []  Amputee adapter []  R  []  L     Style:                  Size:  []  Provide support for stump/residual extremity    []  Transportation tie-down  []  to provide crash tested tie-down brackets    []   Crutch/cane holder    []  O2 holder    []  IV hanger   []  Ventilator tray/mount    []  stabilize accessory on wheelchair       Component  Justification     [x]  Seat cushion  PRM     [x]  accommodate impaired sensation  [x]  decubitus ulcers present or history  [x]  unable to shift weight  [x]  increase pressure distribution  [x]  prevent pelvic extension  [x]  custom required "off-the-shelf"    seat cushion will not accommodate deformity  [x]  stabilize/promote pelvis alignment  [x]  stabilize/promote femur alignment  [x]  accommodate obliquity  [x]  accommodate multiple deformity  []  incontinent/accidents  []  low maintenance     []  seat mounts                 []  fixed []  removable  []  attach seat platform/cushion to wheelchair  frame    []  Seat wedge    []  provide increased aggressiveness of seat shape to decrease sliding  down in the seat  []  accommodate ROM        []  Cover replacement   []  protect back or seat cushion  []  incontinent/accidents    []  Solid seat / insert    []  support cushion to prevent      hammocking  []  allows attachment of cushion to mobility base    []  Lateral pelvic/thigh/hip     support (Guides)     []  decrease abduction  []  accommodate pelvis  []  position upper legs  []  accommodate spasticity  []  removable for transfers     []  Lateral pelvic/thigh      supports mounts  []  fixed   []  swing-away   []  removable  []  mounts lateral pelvic/thigh supports     []  mounts lateral pelvic/thigh supports swing-away or removable for transfers    []  Medial thigh support ([place])  rdecrease adduction raccommodate ROM  rremove for transfers    ralignment      []  Medial thigh   []  fixed      support mounts      []  swing-away   []  removable  []  mounts medial thigh supports   []  Mounts medial supports swing- away or removable for transfers        Component  Justification   [x]  Back    PRM   [x]  provide posterior trunk support []  facilitate tone  [x]  provide lumbar/sacral support [x]  accommodate deformity  [x]  support trunk in midline   [x]  custom required "off-the-shelf" back support will not accommodate deformity   [x]  provide lateral trunk support []  accommodate or decrease tone            [x]  Back mounts  []  fixed  [x]  removable  [x]  attach back rest/cushion to wheelchair frame   []  Lateral trunk      supports  []  R []  L  []  decrease lateral trunk leaning  []  accommodate asymmetry    []  contour for increased contact  []  safety    []  control of tone    []  Lateral trunk      supports mounts  []  fixed  []  swing-away   []  removable  []  mounts lateral trunk supports     []  Mounts lateral trunk supports swing-away or removable for transfers   []  Anterior chest      strap, vest     []  decrease  forward movement of shoulder  []  decrease forward movement of trunk  []  safety/stability  []  added  abdominal support  []  trunk alignment  []  assistance with shoulder control   []  decrease shoulder elevation     Headrest     [x]  [x]  provide posterior head support  [x]  provide posterior neck support  [x]  provide lateral head support  [x]  provide anterior head support  [x]  support during tilt and recline   improve feeding    []  [x]  improve respiration  []  placement of switches  [x]  safety    [x]  accommodate ROM   []  accommodate tone  []  improve visual orientation   [x]  Headrest           []  fixed [x]  removable []  flip down      Mounting hardware   [x]  swing-away laterals/switches  [x]  mount headrest   [x]  mounts headrest flip down or  removable for transfers  [x]  mount headrest swing-away laterals   []  mount switches     []  Neck Support    []  decrease neck rotation  []  decrease forward neck flexion   Pelvic Positioner    []  std hip belt          []  padded hip belt  []  dual pull hip belt  []  four point hip belt  []  stabilize tone  []  decrease falling out of chair  []  prevent excessive extension  []  special pull angle to control      rotation  []  pad for protection over boney      prominence  []  promote comfort    []  Essential needs        bag/pouch   []  medicines []  special food rorthotics []  clothing changes []  diapers   []  catheter/hygiene []  ostomy supplies   The above equipment has a life- long use expectancy.  Growth and changes in medical and/or functional conditions would be the exceptions.   SUMMARY:  Why mobility device was selected; include why a lower level device is not appropriate:   ASSESSMENT:  CLINICAL IMPRESSION: Patient is a 31  y.o. female who was seen today for physical therapy evaluation and treatment for seating surface evaluation. Pt demonstrates significant impairments with bil UE and LE ROM and strength that is limiting her ability to stand, transfer  and perform prolonged unsupported sitting. Patient has history of skin issues in past due to prolonged sitting. Patient has recently gained 25lbs in last year and she has outgrown and worn her current seat cushioning which is not providing adequate support of pain control. Patient will benefit from upgraded seating system to accommodate her current weight to improve support, decreased skin break down and improve pain..    OBJECTIVE IMPAIRMENTS cardiopulmonary status limiting activity, decreased activity tolerance, decreased balance, decreased mobility, decreased ROM, decreased strength, hypomobility, increased edema, impaired flexibility, impaired sensation, impaired UE functional use, postural dysfunction, and pain.   ACTIVITY LIMITATIONS carrying, lifting, bending, sitting, standing, squatting, sleeping, stairs, transfers, bed mobility, continence, bathing, toileting, dressing, self feeding, reach over head, hygiene/grooming, and locomotion level  PARTICIPATION LIMITATIONS: meal prep, cleaning, laundry, medication management, personal finances, shopping, and community activity  PERSONAL FACTORS Age, Past/current experiences, and Time since onset of injury/illness/exacerbation are also affecting patient's functional outcome.   REHAB POTENTIAL: Good  CLINICAL DECISION MAKING: Stable/uncomplicated  EVALUATION COMPLEXITY: Moderate                                   GOALS: One time visit. No goals established.    PLAN:  PT FREQUENCY: one time visit    Kerrie Pleasure, PT 06/07/2022, 2:25 PM    I concur with the above findings and recommendations of the therapist:  Physician name printed:         Physician's signature:      Date:

## 2022-06-11 ENCOUNTER — Encounter: Payer: Medicaid Other | Attending: Physical Medicine & Rehabilitation | Admitting: Physical Medicine & Rehabilitation

## 2022-06-11 ENCOUNTER — Encounter: Payer: Self-pay | Admitting: Physical Medicine & Rehabilitation

## 2022-06-11 VITALS — BP 115/79 | HR 84 | Ht 60.0 in | Wt 85.0 lb

## 2022-06-11 DIAGNOSIS — G7109 Other specified muscular dystrophies: Secondary | ICD-10-CM

## 2022-06-11 NOTE — Patient Instructions (Signed)
[facility] Dr Harolyn Rutherford Diarmid

## 2022-06-11 NOTE — Progress Notes (Signed)
Subjective:    Patient ID: Yvonne Salazar, female    DOB: 1991-11-24, 31 y.o.   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  Initial evaluation was in March 2023.  She has seen physical therapy seating clinic to work on her seating system given the pressure related sciatic pain down the left lower extremity.  She has been prescribed tramadol for intermittent pain but she has not used this at all. No severe shocking pain in the left leg , has not had to take tramadol Have PT seating eval 06/07/22 Also discussed her problem with urinary hesitancy.  We reviewed medications no anticholinergic medications.  She feels like she is emptying her bladder however she does have a history of recurrent UTIs.  She has never seen urology. Pain Inventory Average Pain 5 Pain Right Now 4 My pain is constant, dull, and tingling  LOCATION OF PAIN  BUTTOCKS, TOES, ANKLE  BOWEL Number of stools per week: 9 Oral laxative use Yes  Type of laxative MIRALAX  BLADDER Normal Difficulty starting stream Yes  Incomplete bladder emptying Yes    Mobility how many minutes can you walk? ZERO ability to climb steps?  no use a wheelchair needs help with transfers Do you have any goals in this area?  yes  Function disabled: date disabled BIRTH I need assistance with the following:  dressing, bathing, toileting, meal prep, household duties, and shopping Do you have any goals in this area?  yes  Neuro/Psych numbness tingling trouble walking anxiety  Prior Studies Any changes since last visit?  no  Physicians involved in your care Any changes since last visit?  no   Family History  Problem Relation Age of Onset   Hypertension Mother    Social History   Socioeconomic History   Marital status: Single    Spouse name: Not on file   Number of children: Not on file   Years of  education: Not on file   Highest education level: Not on file  Occupational History   Not on file  Tobacco Use   Smoking status: Never   Smokeless tobacco: Never  Vaping Use   Vaping Use: Never used  Substance and Sexual Activity   Alcohol use: Not Currently   Drug use: Never   Sexual activity: Not on file  Other Topics Concern   Not on file  Social History Narrative   Not on file   Social Determinants of Health   Financial Resource Strain: Not on file  Food Insecurity: Not on file  Transportation Needs: Not on file  Physical Activity: Not on file  Stress: Not on file  Social Connections: Not on file   Past Surgical History:  Procedure Laterality Date   FOOT CAPSULE RELEASE W/ PERCUTANEOUS HEEL CORD LENGTHENING, TIBIAL TENDON TRANSFER  2004   scoliosis repair  2008   TENNIS ELBOW RELEASE/NIRSCHEL PROCEDURE     History reviewed. No pertinent past medical history. Ht 5' (1.524 m)   Wt 85 lb (38.6 kg) Comment: wheelchair  BMI 16.60 kg/m   Opioid Risk Score:   Fall Risk Score:  `1  Depression screen [facility] 2/9     06/11/2022   10:19 AM 01/29/2022    3:08 PM  Depression screen PHQ 2/9  Decreased Interest 0 0  Down, Depressed, Hopeless 0 1  PHQ - 2 Score 0 1  Altered sleeping  1  Tired, decreased energy  1  Change in appetite  0  Feeling bad or failure about yourself   0  Trouble concentrating  1  Moving slowly or fidgety/restless  0  Suicidal thoughts  0  PHQ-9 Score  4    Review of Systems  Constitutional:        Weight gain  Gastrointestinal:  Positive for constipation.  Genitourinary:        RETENTION  Neurological:  Positive for weakness and numbness.       Tingling  Psychiatric/Behavioral:         Anxiety  All other systems reviewed and are negative.     Objective:   Physical Exam Vitals and nursing note reviewed.  Constitutional:      Appearance: She is normal weight.  HENT:     Head: Normocephalic and atraumatic.  Eyes:     Extraocular  Movements: Extraocular movements intact.     Conjunctiva/sclera: Conjunctivae normal.     Pupils: Pupils are equal, round, and reactive to light.  Musculoskeletal:     Comments: Limited abduction bilaterally to around 90 degrees.  No pain with shoulder range of motion elbow range of motion There is contracture bilateral knees lacks 45 degrees from neutral on extension  Skin:    General: Skin is warm and dry.  Neurological:     Mental Status: She is alert and oriented to person, place, and time.  Psychiatric:        Mood and Affect: Mood normal.        Behavior: Behavior normal.     Motor strength is 3 - right deltoid, 4 - bicep tricep 4 - finger flexors extensors wrist flexors and extensors Left upper extremity has no active shoulder or active elbow movement, 3 - wrist flexion extension finger flexion and extension Lower extremity strength Hip flexion 0 Hip adduction to minus hip AB duction to minus within available range Knee extension is 3 - ankle dorsiflexion plantarflexion 3 - Sensation Normal sensation in the upper extremities and right lower extremity       Assessment & Plan:   #1.  Congenital muscular dystrophy with quadriparesis.  Her strength is worse on the left than on the right side.  The patient has a motorized chair.  Family assist with transfers.    2.  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  Awaiting modifications of seat cushion. 3.  Urinary hesitancy with history of recurrent UTI, recommend neuro urology evaluation. Physical medicine rehab follow-up in 4 months

## 2022-07-12 ENCOUNTER — Encounter: Payer: Self-pay | Admitting: Physical Medicine & Rehabilitation

## 2022-10-11 ENCOUNTER — Other Ambulatory Visit (HOSPITAL_COMMUNITY): Payer: Self-pay | Admitting: Adult Health

## 2022-10-11 DIAGNOSIS — N130 Hydronephrosis with ureteropelvic junction obstruction: Secondary | ICD-10-CM

## 2022-10-12 ENCOUNTER — Encounter: Payer: Medicaid Other | Attending: Physical Medicine & Rehabilitation | Admitting: Physical Medicine & Rehabilitation

## 2022-10-12 ENCOUNTER — Encounter: Payer: Self-pay | Admitting: Physical Medicine & Rehabilitation

## 2022-10-12 VITALS — BP 113/73 | HR 81 | Ht 60.0 in

## 2022-10-12 DIAGNOSIS — G7109 Other specified muscular dystrophies: Secondary | ICD-10-CM | POA: Diagnosis not present

## 2022-10-12 NOTE — Patient Instructions (Signed)
Please call if you need to see me sooner

## 2022-10-12 NOTE — Progress Notes (Signed)
Subjective:    Patient ID: Yvonne Salazar, female    DOB: 1991-05-19, 31 y.o.   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  Patient has severe history of restrictive lung disease and has been on a noninvasive ventilator since 2017 uses sip and puff during the day.  Had pulmonary follow-up. Seen by Dr McDiarmid [facility] urology started on low dose abx for UTI suppression Renal US neg but scheduled for renal CT  New WC more comfortable, Rare episodes of pain shooting down the leg from the buttocks region.  This is on the left side.  Nevertheless left foot remains with reduced sensation. Right foot occasionally has shooting pain but no numbness.  Pain Inventory Average Pain 4 Pain Right Now 3 My pain is constant, sharp, dull, and tingling  In the last 24 hours, has pain interfered with the following? General activity 3 Relation with others 0 Enjoyment of life 6 What TIME of day is your pain at its worst? varies Sleep (in general) Good  Pain is worse with: sitting Pain improves with: rest, heat/ice, and medication Relief from Meds: 8  Family History  Problem Relation Age of Onset   Hypertension Mother    Social History   Socioeconomic History   Marital status: Single    Spouse name: Not on file   Number of children: Not on file   Years of education: Not on file   Highest education level: Not on file  Occupational History   Not on file  Tobacco Use   Smoking status: Never   Smokeless tobacco: Never  Vaping Use   Vaping Use: Never used  Substance and Sexual Activity   Alcohol use: Not Currently   Drug use: Never   Sexual activity: Not on file  Other Topics Concern   Not on file  Social History Narrative   Not on file   Social Determinants of Health   Financial Resource Strain: Not on file   Food Insecurity: Not on file  Transportation Needs: Not on file  Physical Activity: Not on file  Stress: Not on file  Social Connections: Not on file   Past Surgical History:  Procedure Laterality Date   FOOT CAPSULE RELEASE W/ PERCUTANEOUS HEEL CORD LENGTHENING, TIBIAL TENDON TRANSFER  2004   scoliosis repair  2008   TENNIS ELBOW RELEASE/NIRSCHEL PROCEDURE     Past Surgical History:  Procedure Laterality Date   FOOT CAPSULE RELEASE W/ PERCUTANEOUS HEEL CORD LENGTHENING, TIBIAL TENDON TRANSFER  2004   scoliosis repair  2008   TENNIS ELBOW RELEASE/NIRSCHEL PROCEDURE     History reviewed. No pertinent past medical history. BP 113/73   Pulse 81   Ht 5' (1.524 m)   SpO2 97%   BMI 16.60 kg/m   Opioid Risk Score:   Fall Risk Score:  `1  Depression screen [facility] 2/9     10/12/2022    3:00 PM 06/11/2022   10:19 AM 01/29/2022    3:08 PM  Depression screen PHQ 2/9  Decreased Interest 0 0 0  Down, Depressed, Hopeless 0 0 1  PHQ - 2 Score 0 0 1  Altered sleeping   1  Tired, decreased energy   1  Change in appetite   0  Feeling bad or failure about yourself    0  Trouble concentrating   1  Moving slowly  or fidgety/restless   0  Suicidal thoughts   0  PHQ-9 Score   4      Review of Systems  Musculoskeletal:  Positive for gait problem.  All other systems reviewed and are negative.     Objective:   Physical Exam Vitals and nursing note reviewed.  Constitutional:      Appearance: She is ill-appearing.     Comments: Has facemask with respiratory assist  Eyes:     Extraocular Movements: Extraocular movements intact.     Conjunctiva/sclera: Conjunctivae normal.     Pupils: Pupils are equal, round, and reactive to light.  Pulmonary:     Breath sounds: No stridor. No wheezing.  Musculoskeletal:     Cervical back: Neck supple.     Comments: Patient has left elbow contracture at around 90 degrees knee contractures bilaterally at 45 degree.  Ankle contracture is fixed at  neutral.  No tenderness palpation along the thoracic or lumbar spine.  She does have pelvic obliquity, well compensated by seating system   Skin:    General: Skin is warm and dry.  Neurological:     Mental Status: She is alert and oriented to person, place, and time.     Comments: Sensation normal to light touch in the right lower extremity Left lower extremity absent sensation in the foot intact sensation medial malleolus.  Reduced at the left heel. Motor strength is 4 - at the right deltoid bicep tricep grip 3 - left deltoid to minus left bicep 3 - finger flexors and extensors She is able to oppose finger to thumb repetitively both upper extremities Lower extremity strength to minus knee extension on the right to minus hip extension on the right 0 at the ankle Left lower extremity trace hip and knee extension           Assessment & Plan:  #1.  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 Triplegic with restrictive lung disease, follows up with pediatric neurology as well as pulmonology at [facility] Neurogenic bladder with recurrent UTIs currently on suppressive therapy following up with neuro-urology  Seating system now accommodating pelvic obliquity and relieving sciatic pain left side.  Given the duration of compression, has residual foot numbness.  The left lower extremity weakness is worse than the right side however left upper extremity weakness also is greater than right side so this may not be related to her sciatic issue. We discussed reclining chair to a flatter position for 30 to 60 seconds every 1/2 hour for pressure relief.  Physical medicine rehab follow-up in 1 year

## 2022-10-29 ENCOUNTER — Ambulatory Visit (HOSPITAL_COMMUNITY): Payer: Medicaid Other

## 2022-11-11 ENCOUNTER — Ambulatory Visit (HOSPITAL_COMMUNITY)
Admission: RE | Admit: 2022-11-11 | Discharge: 2022-11-11 | Disposition: A | Discharge status: Home / Self Care | Payer: Medicaid Other | Source: Ambulatory Visit | Attending: Adult Health | Admitting: Adult Health

## 2022-11-11 DIAGNOSIS — N130 Hydronephrosis with ureteropelvic junction obstruction: Secondary | ICD-10-CM | POA: Diagnosis present

## 2023-02-02 ENCOUNTER — Encounter: Payer: Self-pay | Admitting: Physical Medicine & Rehabilitation

## 2023-02-03 MED ORDER — GABAPENTIN 300 MG PO CAPS: 600.0000 mg | ORAL_CAPSULE | Freq: Three times a day (TID) | ORAL | 11 refills | Status: DC

## 2023-10-13 ENCOUNTER — Encounter: Payer: Medicaid Other | Admitting: Physical Medicine & Rehabilitation

## 2023-10-13 NOTE — Progress Notes (Deleted)
Subjective:    Patient ID: Yvonne Salazar, female    DOB: [birth year], 32 y.o.   MRN: [id number]  HPI   Pain Inventory Average Pain {NUMBERS; 0-10:5044} Pain Right Now {NUMBERS; 0-10:5044} My pain is {PAIN DESCRIPTION:21022940}  LOCATION OF PAIN  ***  BOWEL Number of stools per week: *** Oral laxative use {YES/NO:21197} Type of laxative *** Enema or suppository use {YES/NO:21197} History of colostomy {YES/NO:21197} Incontinent {YES/NO:21197}  BLADDER {bladder options:24190} In and out cath, frequency *** Able to self cath {YES/NO:21197} Bladder incontinence {YES/NO:21197} Frequent urination {YES/NO:21197} Leakage with coughing {YES/NO:21197} Difficulty starting stream {YES/NO:21197} Incomplete bladder emptying {YES/NO:21197}   Mobility {MOBILITY WUJ:81191478}  Function {FUNCTION:21022946}  Neuro/Psych {NEURO/PSYCH:21022948}  Prior Studies {CPRM PRIOR STUDIES:21022953}  Physicians involved in your care {CPRM PHYSICIANS INVOLVED IN YOUR CARE:21022954}   Family History  Problem Relation Age of Onset   Hypertension Mother    Social History   Socioeconomic History   Marital status: Single    Spouse name: Not on file   Number of children: Not on file   Years of education: Not on file   Highest education level: Not on file  Occupational History   Not on file  Tobacco Use   Smoking status: Never   Smokeless tobacco: Never  Vaping Use   Vaping status: Never Used  Substance and Sexual Activity   Alcohol use: Not Currently   Drug use: Never   Sexual activity: Not on file  Other Topics Concern   Not on file  Social History Narrative   Not on file   Social Determinants of Health   Financial Resource Strain: Low Risk  (03/19/2023)   Received from [facility]   Overall Financial Resource Strain (CARDIA)    Difficulty of Paying Living Expenses: Not hard at all  Food Insecurity: No Food Insecurity (03/19/2023)   Received from  [facility]   Hunger Vital Sign    Worried About Running Out of Food in the Last Year: Never true    Ran Out of Food in the Last Year: Never true  Transportation Needs: No Transportation Needs (03/19/2023)   Received from [facility] - Transportation    In the past 12 months, has lack of transportation kept you from medical appointments or from getting medications?: No    Lack of Transportation (Non-Medical): No  Physical Activity: Not on file  Stress: Not on file  Social Connections: Not on file   Past Surgical History:  Procedure Laterality Date   FOOT CAPSULE RELEASE W/ PERCUTANEOUS HEEL CORD LENGTHENING, TIBIAL TENDON TRANSFER  2004   scoliosis repair  2008   TENNIS ELBOW RELEASE/NIRSCHEL PROCEDURE     No past medical history on file. There were no vitals taken for this visit.  Opioid Risk Score:   Fall Risk Score:  `1  Depression screen [facility] 2/9     10/12/2022    3:00 PM 06/11/2022   10:19 AM 01/29/2022    3:08 PM  Depression screen PHQ 2/9  Decreased Interest 0 0 0  Down, Depressed, Hopeless 0 0 1  PHQ - 2 Score 0 0 1  Altered sleeping   1  Tired, decreased energy   1  Change in appetite   0  Feeling bad or failure about yourself    0  Trouble concentrating   1  Moving slowly or fidgety/restless   0  Suicidal thoughts   0  PHQ-9 Score   4  Review of Systems     Objective:   Physical Exam        Assessment & Plan:

## 2023-11-01 ENCOUNTER — Encounter: Payer: Medicaid Other | Attending: Physical Medicine & Rehabilitation | Admitting: Physical Medicine & Rehabilitation

## 2023-11-01 ENCOUNTER — Encounter: Payer: Self-pay | Admitting: Physical Medicine & Rehabilitation

## 2023-11-01 VITALS — BP 109/73 | HR 87 | Ht 60.0 in | Wt 90.0 lb

## 2023-11-01 DIAGNOSIS — G7109 Other specified muscular dystrophies: Secondary | ICD-10-CM | POA: Insufficient documentation

## 2023-11-01 DIAGNOSIS — G5702 Lesion of sciatic nerve, left lower limb: Secondary | ICD-10-CM | POA: Diagnosis present

## 2023-11-01 NOTE — Patient Instructions (Signed)
Stop pregabalin

## 2023-11-01 NOTE — Progress Notes (Signed)
Subjective:    Patient ID: Yvonne Salazar, female    DOB: 12-23-1990, 32 y.o.   MRN: [id number]  She has a history of Ullrich congenital muscular dystrophy resulting in severe myopathy as well as proximal contractures.  She has been wheelchair-bound for many years.  She has undergone thoracolumbar fusion for neuromuscular scoliosis. 5-6 yr hx of insidious onset of Left foot and heel numbness,  pain in sitting bone all the way down to left foot, sometimes associated with straining at stool.  Saw Dr Roetta Sessions at [facility] who performed EMG/NCV demonstrating no sensory nerve abnormalities but could not obtain motor conductions left lower extremity tibial and peroneal.  Suggested ESI but pt could not get positioned for the procedure  HPI CC:  LLE foot and leg pain  Taking Duloxetine 30mg  BID helps with neuropathy pain.  Initially started 30mg  qam but felt it wore off by the afternoon   Takes gabapentin 600mg  BID, not sure if it helps but feels like anxiety is lessened with this med Tried pregabalin 100mg  BID but did not feel like this had any benefit for her LE pain   Has not taken tramadol since 2023   Last visit 1 year ago , seating system doing well , no sig buttocks pain Pain Inventory Average Pain 3 Pain Right Now 3 My pain is intermittent, constant, stabbing, and tingling  LOCATION OF PAIN  buttocks, thigh, toes  BOWEL Number of stools per week: 7 Oral laxative use No  Type of laxative . Enema or suppository use No  History of colostomy No  Incontinent No   BLADDER Pads In and out cath, frequency . Able to self cath No  Bladder incontinence No  Frequent urination No  Leakage with coughing No  Difficulty starting stream No  Incomplete bladder emptying No    Mobility ability to climb steps?  no do you drive?  no use a wheelchair needs help with transfers  Function disabled: date disabled . I need assistance with the following:  dressing, bathing, toileting, meal prep,  household duties, and shopping  Neuro/Psych bladder control problems weakness numbness tingling anxiety  Prior Studies Any changes since last visit?  no  Physicians involved in your care Any changes since last visit?  no   Family History  Problem Relation Age of Onset   Hypertension Mother    Social History   Socioeconomic History   Marital status: Single    Spouse name: Not on file   Number of children: Not on file   Years of education: Not on file   Highest education level: Not on file  Occupational History   Not on file  Tobacco Use   Smoking status: Never   Smokeless tobacco: Never  Vaping Use   Vaping status: Never Used  Substance and Sexual Activity   Alcohol use: Not Currently   Drug use: Never   Sexual activity: Not on file  Other Topics Concern   Not on file  Social History Narrative   Not on file   Social Determinants of Health   Financial Resource Strain: Low Risk  (03/19/2023)   Received from [facility]   Overall Financial Resource Strain (CARDIA)    Difficulty of Paying Living Expenses: Not hard at all  Food Insecurity: No Food Insecurity (03/19/2023)   Received from [facility]   Hunger Vital Sign    Worried About Running Out of Food in the Last Year: Never true  Ran Out of Food in the Last Year: Never true  Transportation Needs: No Transportation Needs (03/19/2023)   Received from [facility] - Transportation    In the past 12 months, has lack of transportation kept you from medical appointments or from getting medications?: No    Lack of Transportation (Non-Medical): No  Physical Activity: Not on file  Stress: Not on file  Social Connections: Not on file   Past Surgical History:  Procedure Laterality Date   FOOT CAPSULE RELEASE W/ PERCUTANEOUS HEEL CORD LENGTHENING, TIBIAL TENDON TRANSFER  2004   scoliosis repair  2008   TENNIS ELBOW RELEASE/NIRSCHEL PROCEDURE     No  past medical history on file. BP 109/73   Pulse 87   Ht 5' (1.524 m)   Wt 90 lb (40.8 kg)   SpO2 99%   BMI 17.58 kg/m   Opioid Risk Score:   Fall Risk Score:  `1  Depression screen [facility] 2/9     10/12/2022    3:00 PM 06/11/2022   10:19 AM 01/29/2022    3:08 PM  Depression screen PHQ 2/9  Decreased Interest 0 0 0  Down, Depressed, Hopeless 0 0 1  PHQ - 2 Score 0 0 1  Altered sleeping   1  Tired, decreased energy   1  Change in appetite   0  Feeling bad or failure about yourself    0  Trouble concentrating   1  Moving slowly or fidgety/restless   0  Suicidal thoughts   0  PHQ-9 Score   4    Review of Systems  Musculoskeletal:        Thigh pain Toe pain Buttocks pain  All other systems reviewed and are negative.     Objective:   Physical Exam 09/2022 exam  Left lower extremity absent sensation in the foot intact sensation medial malleolus.  Reduced at the left heel. Motor strength is 4 - at the right deltoid bicep tricep grip 3 - left deltoid to minus left bicep 3 - finger flexors and extensors She is able to oppose finger to thumb repetitively both upper extremities Lower extremity strength to minus knee extension on the right to minus hip extension on the right 0 at the ankle Left lower extremity trace hip and knee extension  10/2023 Intact LT sensation Left foot heel ankle toes Motor 3- L HF, KE, 2-  L foot inf evert, 0/5 ADF/PF  RLE 3- HF, KE, ADF/PF, Inv /ever ankle /foot Sensation intact to LT Right foot/ankle  RUE Motor strength is 4 - at the right deltoid bicep 3- tricep &grip LUE 2- delt, bi, tri 4/5 grip Intact finger to thumb opposition     Assessment & Plan:    Chronic sciatic neuropathy pain LLE, hx of prolonged pressure at the Left ischial tuberosity.  Has regained some light touch sensation over the last year after compressive neuropathy relieved by changes in seating system.  Still has dysesthetic pain which is responding well to duloxetine  30mg  BID No additional benefit from pregabalin will d/c  No longer taking tramadol will edit med list Cont gabapentin 600mg  BID, no side effects, could consider weaning although pt feels this helps her anxiety  RTC 41mo

## 2024-03-06 ENCOUNTER — Other Ambulatory Visit: Payer: Self-pay | Admitting: Physical Medicine & Rehabilitation

## 2024-10-30 ENCOUNTER — Encounter: Payer: Self-pay | Admitting: Physical Medicine & Rehabilitation

## 2024-10-30 ENCOUNTER — Encounter: Payer: Medicaid Other | Admitting: Physical Medicine & Rehabilitation

## 2024-10-30 VITALS — BP 122/77 | HR 83 | Ht 60.0 in | Wt 95.0 lb

## 2024-10-30 DIAGNOSIS — G7109 Other specified muscular dystrophies: Secondary | ICD-10-CM | POA: Insufficient documentation

## 2024-10-30 MED ORDER — DULOXETINE HCL 40 MG PO CPEP: 40.0000 mg | ORAL_CAPSULE | Freq: Two times a day (BID) | ORAL | 11 refills | Status: AC

## 2024-10-30 NOTE — Progress Notes (Signed)
 Subjective:    Patient ID: Yvonne Salazar, female    DOB: 12/21/1990, 33 y.o.   MRN: [id number]  HPI Discussed the use of AI scribe software for clinical note transcription with the patient, who gave verbal consent to proceed.  History of Present Illness Yvonne Salazar is a 33 year old female with hip dysplasia who presents for a follow-up regarding her wheelchair and pain management.  She is currently using a wheelchair that is over 70 years old and is experiencing issues such as the battery not holding charge, screws falling out, and the armrest almost falling off. She has been repairing it herself but feels it is time for a new one. She previously obtained her wheelchair from [employer] and is considering starting the process for a new one in the new year.  She experiences ongoing sciatic pain, which she attributes to pressure from her seating pad. A better seating pad had previously helped alleviate some of the pain. She is considering getting a new wheelchair to address these issues.  She is currently on duloxetine 30 mg twice a day and gabapentin  600 mg twice a day. She has tried pregabalin 100 mg twice a day in the past but did not find it helpful. She experiences sharp pains in her left heel and is considering adjusting her duloxetine dosage.  She has a history of a tendon release procedure in her left arm at [facility] around 2007, which resulted in weakened muscles. She does not recall who prescribed her current wheelchair but mentioned that her previous primary care provider, Reena Bucco, has moved to [place].  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   Pain Inventory Average Pain 4 Pain Right Now 1 My pain is dull, stabbing, and tingling  In the last 24 hours, has pain interfered with the following? General activity 5 Relation with others 4 Enjoyment of life  3 What TIME of day is your pain at its worst? morning  and evening Sleep (in general) Good  Pain is worse with: some activites Pain improves with: rest, heat/ice, pacing activities, and medication Relief from Meds: Good  Family History  Problem Relation Age of Onset   Hypertension Mother    Social History   Socioeconomic History   Marital status: Single    Spouse name: Not on file   Number of children: Not on file   Years of education: Not on file   Highest education level: Not on file  Occupational History   Not on file  Tobacco Use   Smoking status: Never   Smokeless tobacco: Never  Vaping Use   Vaping status: Never Used  Substance and Sexual Activity   Alcohol use: Not Currently   Drug use: Never   Sexual activity: Not on file  Other Topics Concern   Not on file  Social History Narrative   Not on file   Social Drivers of Health   Financial Resource Strain: Low Risk  (03/19/2023)   Received from [facility]   Overall Financial Resource Strain (CARDIA)    Difficulty of Paying Living Expenses: Not hard at all  Food Insecurity: No Food Insecurity (03/19/2023)   Received from [facility]   Hunger Vital Sign    Within the past 12 months, you worried that your food would run out before you got the money to buy more.: Never true  Within the past 12 months, the food you bought just didn't last and you didn't have money to get more.: Never true  Transportation Needs: Unknown (03/19/2023)   Received from [facility] - Transportation    In the past 12 months, has lack of transportation kept you from medical appointments or from getting medications?: No    Lack of Transportation (Non-Medical): Not on file  Physical Activity: Not on file  Stress: Not on file  Social Connections: Not on file   Past Surgical History:  Procedure Laterality Date   FOOT CAPSULE RELEASE W/ PERCUTANEOUS HEEL CORD LENGTHENING,  TIBIAL TENDON TRANSFER  2004   scoliosis repair  2008   TENNIS ELBOW RELEASE/NIRSCHEL PROCEDURE     Past Surgical History:  Procedure Laterality Date   FOOT CAPSULE RELEASE W/ PERCUTANEOUS HEEL CORD LENGTHENING, TIBIAL TENDON TRANSFER  2004   scoliosis repair  2008   TENNIS ELBOW RELEASE/NIRSCHEL PROCEDURE     No past medical history on file. BP 122/77 (Patient Position: Sitting, Cuff Size: Normal)   Pulse 83   Ht 5' (1.524 m)   Wt 95 lb (43.1 kg)   SpO2 97%   BMI 18.55 kg/m   Opioid Risk Score:   Fall Risk Score:  `1  Depression screen [facility] 2/9     10/12/2022    3:00 PM 06/11/2022   10:19 AM 01/29/2022    3:08 PM  Depression screen PHQ 2/9  Decreased Interest 0 0 0  Down, Depressed, Hopeless 0 0 1  PHQ - 2 Score 0 0 1  Altered sleeping   1  Tired, decreased energy   1  Change in appetite   0  Feeling bad or failure about yourself    0  Trouble concentrating   1  Moving slowly or fidgety/restless   0  Suicidal thoughts   0  PHQ-9 Score   4      Data saved with a previous flowsheet row definition      Review of Systems  Musculoskeletal:  Positive for myalgias.       Left leg and foot pain  All other systems reviewed and are negative.      Objective:   Physical Exam RUE 3-Shoulder abd 3/5 Bi Trace tri 4- grip, finger ext and HI LUE Tr Should abd 0 bi and tri 4- Grip,fing ext and HI RLE- trace hip/knee ext  2- HAdd Trace Hip Abd 3- KE 3- inv/ev 0/5 ADF,APF  LLE Same as above except 0/5 ADF/PF,Inv/Ev  Sensation absent in the left foot Present sensation to light touch in the right foot. There is no pain with range of motion of both ankles. Heel cords allow for neutral positioning.     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 Current management includes duloxetine and gabapentin .  Previous trial of pregabalin was ineffective. Considered increasing duloxetine dosage to manage pain more effectively. Discussed potential benefits of increasing duloxetine to 40 mg BID, with the option to increase to 60 mg BID if no improvement after two months. - Increased duloxetine to 40 mg BID. - Instructed to monitor pain levels over the next two months and contact office if no improvement for potential increase to 60 mg BID. - Referred to physical therapy for coordination with [employer] and wheelchair evaluation.  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 Previous tendon release surgery in the left arm with subsequent muscle weakness. - Referred to physical therapy for ongoing management and coordination with [employer] [facility] for wheelchair evaluation.  Lower extremity contractures and weakness Lower extremity contractures and weakness secondary to congenital muscular dystrophy. - Referred to physical therapy for ongoing management and coordination with [employer] [facility] for wheelchair evaluation.  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 Need for a new wheelchair due to age and functionality concerns. - Referred to physical therapy for coordination with [employer] and wheelchair evaluation. - Documented need for new wheelchair due to battery issues and mechanical failures.

## 2024-12-07 ENCOUNTER — Ambulatory Visit: Admitting: Physical Therapy

## 2024-12-24 ENCOUNTER — Ambulatory Visit: Admitting: Physical Therapy

## 2025-01-09 ENCOUNTER — Ambulatory Visit

## 2025-10-29 ENCOUNTER — Encounter: Admitting: Physical Medicine & Rehabilitation
